# Patient Record
Sex: Female | Born: 1975 | Race: Black or African American | Hispanic: No | Marital: Single | Smoking: Never smoker
Health system: Southern US, Community
[De-identification: ages and names within clinical notes are randomized; demographics above are authoritative.]

## PROBLEM LIST (undated history)

## (undated) DIAGNOSIS — I10 Essential (primary) hypertension: Secondary | ICD-10-CM

## (undated) DIAGNOSIS — D509 Iron deficiency anemia, unspecified: Secondary | ICD-10-CM

## (undated) DIAGNOSIS — D219 Benign neoplasm of connective and other soft tissue, unspecified: Secondary | ICD-10-CM

## (undated) DIAGNOSIS — F4323 Adjustment disorder with mixed anxiety and depressed mood: Secondary | ICD-10-CM

## (undated) DIAGNOSIS — I2699 Other pulmonary embolism without acute cor pulmonale: Secondary | ICD-10-CM

## (undated) DIAGNOSIS — N200 Calculus of kidney: Secondary | ICD-10-CM

## (undated) DIAGNOSIS — F909 Attention-deficit hyperactivity disorder, unspecified type: Secondary | ICD-10-CM

## (undated) HISTORY — DX: Iron deficiency anemia, unspecified: D50.9

## (undated) HISTORY — PX: LEG SURGERY: SHX1003

---

## 1998-04-07 ENCOUNTER — Emergency Department (HOSPITAL_COMMUNITY): Admission: EM | Admit: 1998-04-07 | Discharge: 1998-04-07 | Payer: Self-pay | Admitting: *Deleted

## 1998-04-07 ENCOUNTER — Encounter: Admission: RE | Admit: 1998-04-07 | Discharge: 1998-04-07 | Payer: Self-pay | Admitting: Family Medicine

## 1998-04-21 ENCOUNTER — Encounter: Admission: RE | Admit: 1998-04-21 | Discharge: 1998-04-21 | Payer: Self-pay | Admitting: Family Medicine

## 1998-04-24 ENCOUNTER — Encounter: Admission: RE | Admit: 1998-04-24 | Discharge: 1998-04-24 | Payer: Self-pay | Admitting: Family Medicine

## 1998-04-26 ENCOUNTER — Encounter: Admission: RE | Admit: 1998-04-26 | Discharge: 1998-04-26 | Payer: Self-pay | Admitting: Family Medicine

## 1998-04-28 ENCOUNTER — Encounter: Admission: RE | Admit: 1998-04-28 | Discharge: 1998-04-28 | Payer: Self-pay | Admitting: Family Medicine

## 1998-05-10 ENCOUNTER — Encounter: Admission: RE | Admit: 1998-05-10 | Discharge: 1998-05-10 | Payer: Self-pay | Admitting: Family Medicine

## 1998-06-08 ENCOUNTER — Emergency Department (HOSPITAL_COMMUNITY): Admission: EM | Admit: 1998-06-08 | Discharge: 1998-06-08 | Payer: Self-pay | Admitting: Emergency Medicine

## 1998-06-15 ENCOUNTER — Encounter: Admission: RE | Admit: 1998-06-15 | Discharge: 1998-06-15 | Payer: Self-pay | Admitting: Sports Medicine

## 1998-07-12 ENCOUNTER — Inpatient Hospital Stay (HOSPITAL_COMMUNITY): Admission: AD | Admit: 1998-07-12 | Discharge: 1998-07-12 | Payer: Self-pay | Admitting: Obstetrics

## 1998-07-25 ENCOUNTER — Encounter: Admission: RE | Admit: 1998-07-25 | Discharge: 1998-07-25 | Payer: Self-pay | Admitting: Sports Medicine

## 1998-07-26 ENCOUNTER — Inpatient Hospital Stay (HOSPITAL_COMMUNITY): Admission: AD | Admit: 1998-07-26 | Discharge: 1998-07-26 | Payer: Self-pay | Admitting: *Deleted

## 1998-10-11 ENCOUNTER — Inpatient Hospital Stay (HOSPITAL_COMMUNITY): Admission: AD | Admit: 1998-10-11 | Discharge: 1998-10-11 | Payer: Self-pay | Admitting: *Deleted

## 1998-10-24 ENCOUNTER — Encounter: Admission: RE | Admit: 1998-10-24 | Discharge: 1998-10-24 | Payer: Self-pay | Admitting: Sports Medicine

## 1998-10-27 ENCOUNTER — Encounter: Admission: RE | Admit: 1998-10-27 | Discharge: 1998-10-27 | Payer: Self-pay | Admitting: Family Medicine

## 1998-11-15 ENCOUNTER — Encounter: Admission: RE | Admit: 1998-11-15 | Discharge: 1998-11-15 | Payer: Self-pay | Admitting: Family Medicine

## 1998-11-16 ENCOUNTER — Encounter: Admission: RE | Admit: 1998-11-16 | Discharge: 1998-11-16 | Payer: Self-pay | Admitting: Family Medicine

## 1999-05-21 ENCOUNTER — Emergency Department (HOSPITAL_COMMUNITY): Admission: EM | Admit: 1999-05-21 | Discharge: 1999-05-21 | Payer: Self-pay | Admitting: Internal Medicine

## 1999-06-21 ENCOUNTER — Encounter: Admission: RE | Admit: 1999-06-21 | Discharge: 1999-06-21 | Payer: Self-pay | Admitting: Family Medicine

## 1999-07-01 ENCOUNTER — Emergency Department (HOSPITAL_COMMUNITY): Admission: EM | Admit: 1999-07-01 | Discharge: 1999-07-01 | Payer: Self-pay | Admitting: Emergency Medicine

## 1999-09-27 ENCOUNTER — Emergency Department (HOSPITAL_COMMUNITY): Admission: EM | Admit: 1999-09-27 | Discharge: 1999-09-27 | Payer: Self-pay | Admitting: Emergency Medicine

## 1999-09-27 ENCOUNTER — Encounter: Payer: Self-pay | Admitting: Emergency Medicine

## 1999-10-18 ENCOUNTER — Other Ambulatory Visit: Admission: RE | Admit: 1999-10-18 | Discharge: 1999-10-18 | Payer: Self-pay | Admitting: *Deleted

## 1999-10-18 ENCOUNTER — Encounter: Admission: RE | Admit: 1999-10-18 | Discharge: 1999-10-18 | Payer: Self-pay | Admitting: Family Medicine

## 1999-11-03 ENCOUNTER — Emergency Department (HOSPITAL_COMMUNITY): Admission: EM | Admit: 1999-11-03 | Discharge: 1999-11-03 | Payer: Self-pay | Admitting: Emergency Medicine

## 2000-09-27 ENCOUNTER — Inpatient Hospital Stay (HOSPITAL_COMMUNITY): Admission: AD | Admit: 2000-09-27 | Discharge: 2000-09-27 | Payer: Self-pay | Admitting: Obstetrics

## 2000-09-28 ENCOUNTER — Encounter: Payer: Self-pay | Admitting: Obstetrics

## 2000-10-14 ENCOUNTER — Inpatient Hospital Stay (HOSPITAL_COMMUNITY): Admission: AD | Admit: 2000-10-14 | Discharge: 2000-10-14 | Payer: Self-pay | Admitting: *Deleted

## 2001-01-15 ENCOUNTER — Encounter: Admission: RE | Admit: 2001-01-15 | Discharge: 2001-01-15 | Payer: Self-pay | Admitting: Family Medicine

## 2001-01-21 ENCOUNTER — Ambulatory Visit (HOSPITAL_COMMUNITY): Admission: RE | Admit: 2001-01-21 | Discharge: 2001-01-21 | Payer: Self-pay | Admitting: *Deleted

## 2001-02-04 ENCOUNTER — Inpatient Hospital Stay (HOSPITAL_COMMUNITY): Admission: AD | Admit: 2001-02-04 | Discharge: 2001-02-04 | Payer: Self-pay | Admitting: *Deleted

## 2001-03-01 ENCOUNTER — Inpatient Hospital Stay (HOSPITAL_COMMUNITY): Admission: AD | Admit: 2001-03-01 | Discharge: 2001-03-01 | Payer: Self-pay | Admitting: *Deleted

## 2001-03-02 ENCOUNTER — Inpatient Hospital Stay (HOSPITAL_COMMUNITY): Admission: AD | Admit: 2001-03-02 | Discharge: 2001-03-04 | Payer: Self-pay | Admitting: Obstetrics

## 2001-03-02 ENCOUNTER — Encounter: Admission: RE | Admit: 2001-03-02 | Discharge: 2001-03-02 | Payer: Self-pay | Admitting: Family Medicine

## 2001-03-10 ENCOUNTER — Inpatient Hospital Stay (HOSPITAL_COMMUNITY): Admission: AD | Admit: 2001-03-10 | Discharge: 2001-03-10 | Payer: Self-pay | Admitting: *Deleted

## 2001-03-18 ENCOUNTER — Encounter: Admission: RE | Admit: 2001-03-18 | Discharge: 2001-03-18 | Payer: Self-pay | Admitting: Family Medicine

## 2001-03-20 ENCOUNTER — Encounter: Admission: RE | Admit: 2001-03-20 | Discharge: 2001-03-20 | Payer: Self-pay | Admitting: Family Medicine

## 2001-03-25 ENCOUNTER — Encounter: Admission: RE | Admit: 2001-03-25 | Discharge: 2001-03-25 | Payer: Self-pay | Admitting: Family Medicine

## 2001-04-02 ENCOUNTER — Encounter: Admission: RE | Admit: 2001-04-02 | Discharge: 2001-04-02 | Payer: Self-pay | Admitting: Family Medicine

## 2001-04-02 ENCOUNTER — Encounter: Payer: Self-pay | Admitting: *Deleted

## 2001-04-02 ENCOUNTER — Ambulatory Visit (HOSPITAL_COMMUNITY): Admission: RE | Admit: 2001-04-02 | Discharge: 2001-04-02 | Payer: Self-pay | Admitting: *Deleted

## 2001-04-10 ENCOUNTER — Inpatient Hospital Stay (HOSPITAL_COMMUNITY): Admission: AD | Admit: 2001-04-10 | Discharge: 2001-04-10 | Payer: Self-pay | Admitting: *Deleted

## 2001-04-20 ENCOUNTER — Encounter: Admission: RE | Admit: 2001-04-20 | Discharge: 2001-04-20 | Payer: Self-pay | Admitting: Family Medicine

## 2001-04-25 ENCOUNTER — Inpatient Hospital Stay (HOSPITAL_COMMUNITY): Admission: AD | Admit: 2001-04-25 | Discharge: 2001-04-25 | Payer: Self-pay | Admitting: Obstetrics

## 2001-04-27 ENCOUNTER — Encounter: Admission: RE | Admit: 2001-04-27 | Discharge: 2001-04-27 | Payer: Self-pay | Admitting: Family Medicine

## 2001-04-30 ENCOUNTER — Inpatient Hospital Stay (HOSPITAL_COMMUNITY): Admission: AD | Admit: 2001-04-30 | Discharge: 2001-04-30 | Payer: Self-pay | Admitting: Obstetrics & Gynecology

## 2001-05-12 ENCOUNTER — Inpatient Hospital Stay (HOSPITAL_COMMUNITY): Admission: AD | Admit: 2001-05-12 | Discharge: 2001-05-14 | Payer: Self-pay | Admitting: Obstetrics

## 2001-08-11 ENCOUNTER — Encounter: Admission: RE | Admit: 2001-08-11 | Discharge: 2001-08-11 | Payer: Self-pay | Admitting: Family Medicine

## 2001-08-11 ENCOUNTER — Encounter (INDEPENDENT_AMBULATORY_CARE_PROVIDER_SITE_OTHER): Payer: Self-pay | Admitting: *Deleted

## 2001-09-29 ENCOUNTER — Encounter: Admission: RE | Admit: 2001-09-29 | Discharge: 2001-09-29 | Payer: Self-pay | Admitting: Family Medicine

## 2001-10-06 ENCOUNTER — Encounter: Admission: RE | Admit: 2001-10-06 | Discharge: 2001-10-06 | Payer: Self-pay | Admitting: Family Medicine

## 2001-11-16 ENCOUNTER — Encounter: Admission: RE | Admit: 2001-11-16 | Discharge: 2001-11-16 | Payer: Self-pay | Admitting: Sports Medicine

## 2001-12-01 ENCOUNTER — Inpatient Hospital Stay (HOSPITAL_COMMUNITY): Admission: AD | Admit: 2001-12-01 | Discharge: 2001-12-01 | Payer: Self-pay | Admitting: *Deleted

## 2002-01-01 ENCOUNTER — Encounter: Admission: RE | Admit: 2002-01-01 | Discharge: 2002-01-01 | Payer: Self-pay | Admitting: Family Medicine

## 2002-02-27 ENCOUNTER — Emergency Department (HOSPITAL_COMMUNITY): Admission: EM | Admit: 2002-02-27 | Discharge: 2002-02-27 | Payer: Self-pay | Admitting: Emergency Medicine

## 2002-04-23 ENCOUNTER — Encounter: Admission: RE | Admit: 2002-04-23 | Discharge: 2002-04-23 | Payer: Self-pay | Admitting: Family Medicine

## 2002-07-08 ENCOUNTER — Encounter: Admission: RE | Admit: 2002-07-08 | Discharge: 2002-07-08 | Payer: Self-pay | Admitting: Family Medicine

## 2002-12-02 ENCOUNTER — Encounter: Admission: RE | Admit: 2002-12-02 | Discharge: 2002-12-02 | Payer: Self-pay | Admitting: Sports Medicine

## 2003-03-10 ENCOUNTER — Encounter: Admission: RE | Admit: 2003-03-10 | Discharge: 2003-03-10 | Payer: Self-pay | Admitting: Family Medicine

## 2003-05-12 ENCOUNTER — Encounter: Admission: RE | Admit: 2003-05-12 | Discharge: 2003-05-12 | Payer: Self-pay | Admitting: Family Medicine

## 2003-08-08 ENCOUNTER — Encounter: Admission: RE | Admit: 2003-08-08 | Discharge: 2003-08-08 | Payer: Self-pay | Admitting: Sports Medicine

## 2003-09-19 ENCOUNTER — Inpatient Hospital Stay (HOSPITAL_COMMUNITY): Admission: AD | Admit: 2003-09-19 | Discharge: 2003-09-19 | Payer: Self-pay | Admitting: Obstetrics and Gynecology

## 2003-11-22 ENCOUNTER — Encounter: Admission: RE | Admit: 2003-11-22 | Discharge: 2003-11-22 | Payer: Self-pay | Admitting: Family Medicine

## 2004-04-09 ENCOUNTER — Encounter (INDEPENDENT_AMBULATORY_CARE_PROVIDER_SITE_OTHER): Payer: Self-pay | Admitting: *Deleted

## 2004-04-09 ENCOUNTER — Encounter: Admission: RE | Admit: 2004-04-09 | Discharge: 2004-04-09 | Payer: Self-pay | Admitting: Family Medicine

## 2004-05-04 ENCOUNTER — Encounter: Admission: RE | Admit: 2004-05-04 | Discharge: 2004-05-04 | Payer: Self-pay | Admitting: Family Medicine

## 2004-08-21 ENCOUNTER — Encounter: Admission: RE | Admit: 2004-08-21 | Discharge: 2004-08-21 | Payer: Self-pay | Admitting: Family Medicine

## 2004-11-08 ENCOUNTER — Ambulatory Visit: Payer: Self-pay | Admitting: Family Medicine

## 2004-12-09 ENCOUNTER — Emergency Department (HOSPITAL_COMMUNITY): Admission: EM | Admit: 2004-12-09 | Discharge: 2004-12-09 | Payer: Self-pay | Admitting: Family Medicine

## 2005-06-04 ENCOUNTER — Ambulatory Visit: Payer: Self-pay | Admitting: Family Medicine

## 2005-06-29 ENCOUNTER — Encounter (INDEPENDENT_AMBULATORY_CARE_PROVIDER_SITE_OTHER): Payer: Self-pay | Admitting: *Deleted

## 2005-07-10 ENCOUNTER — Ambulatory Visit: Payer: Self-pay | Admitting: Family Medicine

## 2005-08-30 ENCOUNTER — Ambulatory Visit: Payer: Self-pay | Admitting: Physical Medicine & Rehabilitation

## 2005-08-30 ENCOUNTER — Inpatient Hospital Stay (HOSPITAL_COMMUNITY): Admission: EM | Admit: 2005-08-30 | Discharge: 2005-09-10 | Payer: Self-pay | Admitting: Emergency Medicine

## 2005-09-10 ENCOUNTER — Inpatient Hospital Stay (HOSPITAL_COMMUNITY)
Admission: RE | Admit: 2005-09-10 | Discharge: 2005-09-19 | Payer: Self-pay | Admitting: Physical Medicine & Rehabilitation

## 2005-10-21 ENCOUNTER — Encounter: Admission: RE | Admit: 2005-10-21 | Discharge: 2006-01-19 | Payer: Self-pay | Admitting: Orthopedic Surgery

## 2005-12-05 ENCOUNTER — Ambulatory Visit: Payer: Self-pay | Admitting: Family Medicine

## 2006-01-20 ENCOUNTER — Encounter: Admission: RE | Admit: 2006-01-20 | Discharge: 2006-02-05 | Payer: Self-pay | Admitting: Orthopedic Surgery

## 2006-01-23 ENCOUNTER — Ambulatory Visit: Payer: Self-pay | Admitting: Sports Medicine

## 2006-02-06 ENCOUNTER — Encounter: Admission: RE | Admit: 2006-02-06 | Discharge: 2006-03-05 | Payer: Self-pay | Admitting: Orthopedic Surgery

## 2006-03-06 ENCOUNTER — Encounter: Admission: RE | Admit: 2006-03-06 | Discharge: 2006-06-04 | Payer: Self-pay | Admitting: Orthopedic Surgery

## 2006-12-30 HISTORY — PX: REPLACEMENT TOTAL KNEE: SUR1224

## 2007-02-16 ENCOUNTER — Ambulatory Visit (HOSPITAL_COMMUNITY): Admission: RE | Admit: 2007-02-16 | Discharge: 2007-02-16 | Payer: Self-pay | Admitting: Geriatric Medicine

## 2007-02-16 ENCOUNTER — Ambulatory Visit: Payer: Self-pay | Admitting: Vascular Surgery

## 2007-02-26 DIAGNOSIS — B351 Tinea unguium: Secondary | ICD-10-CM

## 2007-02-26 DIAGNOSIS — D649 Anemia, unspecified: Secondary | ICD-10-CM

## 2007-02-26 DIAGNOSIS — J45909 Unspecified asthma, uncomplicated: Secondary | ICD-10-CM

## 2007-02-26 DIAGNOSIS — E669 Obesity, unspecified: Secondary | ICD-10-CM

## 2007-02-27 ENCOUNTER — Encounter (INDEPENDENT_AMBULATORY_CARE_PROVIDER_SITE_OTHER): Payer: Self-pay | Admitting: *Deleted

## 2007-04-28 ENCOUNTER — Ambulatory Visit (HOSPITAL_COMMUNITY): Admission: RE | Admit: 2007-04-28 | Discharge: 2007-04-28 | Payer: Self-pay | Admitting: Gastroenterology

## 2008-05-16 ENCOUNTER — Inpatient Hospital Stay (HOSPITAL_COMMUNITY): Admission: AD | Admit: 2008-05-16 | Discharge: 2008-05-17 | Payer: Self-pay | Admitting: Gynecology

## 2008-05-27 ENCOUNTER — Inpatient Hospital Stay (HOSPITAL_COMMUNITY): Admission: RE | Admit: 2008-05-27 | Discharge: 2008-05-27 | Payer: Self-pay | Admitting: Obstetrics & Gynecology

## 2008-06-11 ENCOUNTER — Emergency Department (HOSPITAL_COMMUNITY): Admission: EM | Admit: 2008-06-11 | Discharge: 2008-06-11 | Payer: Self-pay | Admitting: Emergency Medicine

## 2008-09-03 ENCOUNTER — Inpatient Hospital Stay (HOSPITAL_COMMUNITY): Admission: AD | Admit: 2008-09-03 | Discharge: 2008-09-03 | Payer: Self-pay | Admitting: Obstetrics and Gynecology

## 2008-10-17 ENCOUNTER — Inpatient Hospital Stay (HOSPITAL_COMMUNITY): Admission: AD | Admit: 2008-10-17 | Discharge: 2008-10-17 | Payer: Self-pay | Admitting: Obstetrics & Gynecology

## 2008-12-18 ENCOUNTER — Inpatient Hospital Stay (HOSPITAL_COMMUNITY): Admission: AD | Admit: 2008-12-18 | Discharge: 2008-12-19 | Payer: Self-pay | Admitting: Obstetrics and Gynecology

## 2009-01-16 ENCOUNTER — Inpatient Hospital Stay (HOSPITAL_COMMUNITY): Admission: AD | Admit: 2009-01-16 | Discharge: 2009-01-18 | Payer: Self-pay | Admitting: Obstetrics and Gynecology

## 2009-06-24 ENCOUNTER — Ambulatory Visit: Payer: Self-pay | Admitting: Diagnostic Radiology

## 2009-06-24 ENCOUNTER — Emergency Department (HOSPITAL_BASED_OUTPATIENT_CLINIC_OR_DEPARTMENT_OTHER): Admission: EM | Admit: 2009-06-24 | Discharge: 2009-06-24 | Payer: Self-pay | Admitting: Emergency Medicine

## 2011-01-19 ENCOUNTER — Encounter: Payer: Self-pay | Admitting: Sports Medicine

## 2011-01-20 ENCOUNTER — Encounter: Payer: Self-pay | Admitting: Orthopedic Surgery

## 2011-04-15 LAB — CBC
Hemoglobin: 10 g/dL — ABNORMAL LOW (ref 12.0–15.0)
Hemoglobin: 9.9 g/dL — ABNORMAL LOW (ref 12.0–15.0)
MCV: 85.4 fL (ref 78.0–100.0)
Platelets: 207 10*3/uL (ref 150–400)
Platelets: 220 10*3/uL (ref 150–400)
RDW: 16 % — ABNORMAL HIGH (ref 11.5–15.5)
WBC: 9.9 10*3/uL (ref 4.0–10.5)

## 2011-05-17 NOTE — Op Note (Signed)
NAMEJAMESIA, LINNEN NO.:  [id number]   MEDICAL RECORD NO.:  [id number]          PATIENT TYPE:  INP   LOCATION:  5008                         FACILITY:  [facility]   PHYSICIAN:  Doralee Albino. Carola Frost, M.D. DATE OF BIRTH:  [birth year]   DATE OF PROCEDURE:  09/03/2005  DATE OF DISCHARGE:                                 OPERATIVE REPORT   PREOPERATIVE DIAGNOSIS:  Open left leg lateral fasciotomy.   POSTOPERATIVE DIAGNOSIS:  Open left leg lateral fasciotomy.   PROCEDURE:  Irrigation and debridement with delayed primary closure of open  15 cm lateral wound.   SURGEON:  Doralee Albino. Carola Frost, M.D.   ASSISTANT:  None.   ANESTHESIA:  General.   COMPLICATIONS:  None.   TOURNIQUET:  None.   ESTIMATED BLOOD LOSS:  Scant.   FINDINGS:  Pink, healthy appearing, contractile muscle of both the lateral  and anterior compartments.   DISPOSITION:  To PACU.   CONDITION:  Stable.   BRIEF SUMMARY OF INDICATIONS FOR PROCEDURE:  Ms. Kristen Swanson is a 35 year old  female who sustained a low energy fracture dislocation of the left knee  treated by Dr. Priscille Kluver with spanning external fixation and delayed four  compartment fasciotomies. Angiogram was negative for vascular injury. The  patient did have a peroneal nerve sensory and motor deficit at the time of  her injury which has persisted. She underwent medial closure at the time of  her initial fasciotomy and now presents for repeat irrigation and  debridement with possible delayed primary closure versus wound VAC  application. She understood the risks of infection and possible need for  further surgery as well as other complications. She also understood that she  will require additional procedures including internal fixation of her  fracture as well as repair of her posterolateral corner with or without  repair of her cruciate ligaments.   BRIEF SUMMARY OF PROCEDURE:  Ms. Kristen Swanson was taken to the operating room where  general anesthesia was  induced. The left lower extremity was prepped and  draped in the usual sterile fashion. The patient was administered 1 g of  Ancef.  Her wound was irrigated with 2 liters of normal saline using a bulb  syringe removing the hematoma and clot. The muscle bellies were examined and  were found to contract to physical touch as well as electrocautery.  The  muscle bellies themselves were pink and healthy. There were no areas of  necrosis that required debridement. The wound was closed using 2-0 Vicryl  for the subcutaneous layer and 3-0 nylon for the skin. Sterile gently  compressive dressing was applied.  The pin sites were redressed as well. The  patient was taken to PACU in stable condition.   PROGNOSIS:  Ms. Ambroise has sustained a serious injury to her left knee  resulting in three problems which required correction.  The first of these  was elimination of the open wound on the lateral aspect of her leg from her  fasciotomy. This was performed today without apparent complication. She will  require the internal fixation of her plateau  fracture with treatment of her  knee capsule injury as well, particularly involving the posterior lateral  corner.  We are hopeful to have this performed within two weeks from injury.  CT scan also demonstrates involvement of her tibial eminence which may  represent avulsion of her cruciate ligaments rather than mid substance  tearing. If healing of this fracture in an anatomic position restores  appropriate anterior and posterior laxity, then it is likely that Ms. Strada  could have a stable knee following the index procedure as described above.  Alternatively, she will need to undergo delayed reconstruction of her right  cruciate ligaments. With regard to her peroneal motor palsy, she will  require prolonged management with EMG studies around four to six weeks as a  baseline. She will likely require an AFO as expected management is followed.  She may be a  candidate given her young age for a soft tissue procedure to  restore active extension of her foot and ankle. There is controversy  regarding the length of time to persist with spanning external fixation of  the knee. Given her large body habitus, it is unlikely that a hinged knee  brace could adequately stabilize and protect the soft tissue repair of her  knee. Consequently, the fixator may need to be continued for four to six  weeks. This patient's presentation and injuries have been discussed with  some of my trauma colleagues and her case will continue to be discussed with  someone from sports medicine domain, particularly those who specialize in  multiligament knee reconstructions. The patient remains at increased risk  for infection, given her fasciotomies and also at increased risk for  thromboembolic complications given her obesity. She will remain on DVT  prophylaxis with nonweightbearing on the left lower extremity.      Doralee Albino. Carola Frost, M.D.  Electronically Signed     MHH/MEDQ  D:  09/03/2005  T:  09/03/2005  Job:  [id number]

## 2011-05-17 NOTE — Discharge Summary (Signed)
[name], Swanson NO.:  [id number]   MEDICAL RECORD NO.:  [id number]          PATIENT TYPE:  INP   LOCATION:  5008                         FACILITY:  [facility]   PHYSICIAN:  Doralee Albino. Carola Frost, M.D. DATE OF BIRTH:  04-Apr-1976   DATE OF ADMISSION:  08/30/2005  DATE OF DISCHARGE:  09/10/2005                           DISCHARGE SUMMARY - REFERRING   ADMISSION DIAGNOSES:  1.  Left tibial plateau fracture.  2.  Left peroneal nerve palsy.   DISCHARGE DIAGNOSIS:  Status post open reduction and internal fixation, left  tibial plateau, as well as peroneal nerve exploration release.   SURGEON:  Doralee Albino. Carola Frost, M.D.   PROCEDURES PERFORMED:  1.  August 31, 2005, for compartment fasciotomy left leg performed by Dr.      Priscille Kluver without complications.  2.  September 03, 2005, irrigation and debridement with delayed primary      closure of open 15 cm lateral wound done by Doralee Albino. Carola Frost, M.D.,      without complications.  3.  August 30, 2005, closed manipulation with application of external      fixator on the left lower extremity by Dr. Priscille Kluver without      complications.  4.  September 05, 2005, open reduction and internal fixation of      intercondylar tibial plateau, left knee.  Open treatment with primary      repair of knee dislocation including repair of posterior lateral  corner      structures, LCL popliteal fibular ligament, repair of biceps tendon to      the fibula, allograft reconstruction, common peroneal nerve exploration      release and lateral meniscus repair by Dr. Carola Frost without complications.   HISTORY:  This is a 35 year old African American female who presented to  [facility] Emergency Room with left knee pain after a fall.  She reports  that she was going down stairs, missed a step and tripped.  Upon falling on  her left knee, she heard a crack with immediate left knee pain.  She also  bumped her right forehead.  She also was complaining of  foot ankle numbness  and tingling.   ALLERGIES:  No known drug allergies.   MEDICATIONS:  1.  Albuterol inhaler two puffs q.4h. p.r.n.  2.  Zyrtec 10 mg daily.   PAST MEDICAL HISTORY:  1.  Asthma.  2.  Allergies.   PAST SURGICAL HISTORY:  ORIF right wrist fracture.   SOCIAL HISTORY:  Denies smoking or alcohol.  She is single.  Lives with two  kids, a 19 and an 81-year-old in a one story house and works in Airline pilot.   PHYSICAL EXAMINATION ON ADMISSION:  GENERAL APPEARANCE:  A well-developed,  well-nourished, obese 35 year old [place] American female.  HEENT:  Pupils are equal, round, reactive to light and accommodation.  Normocephalic and atraumatic.  NECK:  Supple.  CHEST:  Clear to auscultation bilaterally.  CARDIOVASCULAR:  Regular rate and rhythm, normal S1 and S2.  ABDOMEN:  Positive bowel sounds, soft and nontender.  EXTREMITIES:  Left leg short and  externally rotated.  Unable to dorsiflex  great toe, DP and posterior tib 2+ bilaterally.   Patient was admitted on August 30, 2005, undergoing closed reduction with  external fixation by Dr. Erasmo Leventhal without complications.  She tolerated  the procedure quite well.   On August 31, 2005, the patient then had a four compartment fasciotomy on  the left lower extremity performed by Dr. Priscille Kluver without complications.   On September 01, 2005, patient doing fairly well.  Wound VAC is active on  left lower extremity as well as external fixator in place.  Vital signs are  reviewed and within normal limits.   On September 02, 2005, patient seen.  Pain is very severe today.  We will  change up pain medicine today to help control pain medicine.   On September 03, 2005, Dr. Myrene Galas was consulted to take over care on  Ms. Kristen Swanson.  He evaluates the patient today.  Patient has no superficial  peroneal nerve sensation with decreased deep peroneal nerve sensation.  This  day, Dr. Carola Frost did take patient back to the OR for  fasciotomy closure and  she tolerated the procedure without complications.   On September 04, 2005, patient is seen again.  No change in exam from  previous days.  External fixator remains in place.  Patient will need  further surgery on the left knee.  We will obtain an MRI to find out  severity of soft tissue injuries to the left knee.  Patient is also given  potassium replacement on this day.   Patient seen on September 06, 2005.  Chief complaint is pain.  We will obtain  a pain service consult today.  Dressing change will take place in the a.m.  Will hold off on physical therapy at this time secondary to severe pain.  She will be nonweightbearing on the left lower extremity.  We will continue  to monitor lab values.  We anticipate discharge Sunday or Monday.   Patient seen September 07, 2005.  Unable to mobilize very well with physical  therapy.  She reports that she also is living at home alone with two kids.  Does not feel that she is capable of going home and resuming this lifestyle  at this time secondary to pain and inability to mobilize.  She is requesting  further evaluation for possible placement in rehab or [facility]U.  Those consults  were obtained and awaiting bed availability and whether or not she is a  candidate for rehab or SACU.   On September 09, 2005, she is seen once again.  There has been no change in  the exam.  External fixator remains in place.  Bandages were replaced on the  external fixator at this time by Cecil Cranker, PA.  Rehab is  evaluating patient to see if she is a candidate for rehab at this point in  time.   On September 10, 2005, patient is seen.  She did very well with physical  therapy on previous day.  She will be transported to rehab today for further  care and treatment.   CONDITION ON DISCHARGE:  Good.   DISPOSITION:  Discharge to rehab.  DISCHARGE INSTRUCTIONS:  Patient is nonweightbearing on the left lower  extremity at this time.   She will need daily dressing changes on her  incisions and wounds on her left lower extremity as well as daily pin site  care to include Kerlix only to wrap tightly around the  pin sites.  If pin  sites become dirty or otherwise suspicious, please wash only with soap and  water and then dry completely and rewrap with Kerlix tightly once again.  AFO for foot drop.   DISCHARGE MEDICATIONS:  1.  Claritin 10 mg p.o. nightly which is a substitute for her Zyrtec.  2.  Coumadin per pharmacy protocol.  3.  Oxycodone 40 mg p.o. q.12h.  4.  Robaxin 500 mg p.o. q.6h.  5.  Albuterol 2.5 mg inhaler p.r.n.  6.  Albuterol two puffs q.4h. p.r.n.  7.  Percocet one to two tablets p.o. q.4h. p.r.n. pain.  8.  Ativan 0.5 mg p.o. q.8h. p.r.n.  9.  Ambien 5 to 10 mg nightly p.r.n.   FOLLOW UP:  Please follow up with Dr. Myrene Galas in approximately one  week from discharge.      Cecil Cranker, PA      Doralee Albino. Carola Frost, M.D.  Electronically Signed    RWC/MEDQ  D:  09/10/2005  T:  09/10/2005  Job:  [id number]

## 2011-05-17 NOTE — Discharge Summary (Signed)
NAMEGIANNINA, [name]   MEDICAL RECORD NO.:  [id number]          PATIENT TYPE:  IPS   LOCATION:  4011                         FACILITY:  [facility]   PHYSICIAN:  Ranelle Oyster, M.D.DATE OF BIRTH:  11/18/1976   DATE OF ADMISSION:  09/10/2005  DATE OF DISCHARGE:  09/18/2005                                 DISCHARGE SUMMARY   ADDENDUM:  Concerning discharge date initially established for September 17, 2005. This  was extended to September 18, 2005, to enable external fixator to be removed  from the lower extremity, monitor the patient's mobility at that time with  an extra day of therapies, to address activities of daily living, and  functional status, and discharge to be extended until September 18, 2005.  All necessary therapy teams were advised of change in discharge plan. The  patient remained medically stable throughout this time.      Mariam Dollar, P.A.      Ranelle Oyster, M.D.  Electronically Signed    DA/MEDQ  D:  09/16/2005  T:  09/16/2005  Job:  [id number]

## 2011-05-17 NOTE — Discharge Summary (Signed)
[facility]  Patient:    Kristen Swanson, Kristen Swanson                          MRN: [id number] Adm. Date:  [id number] Disc. Date: 03/04/01 Attending:  Tammi Sou Dictator:   Ocie Doyne, CC:         Solon Palm, M.D.   Discharge Summary  DATE OF BIRTH:                Dec 15, 1976  DISCHARGE DIAGNOSES:          1. Preterm labor.                               2. A [redacted] week gestation intrauterine pregnancy.  DISCHARGE MEDICATIONS:        1. Procardia XL 60 mg p.o. q.d.                               2. Prenatal vitamin one p.o. q.d.  HISTORY AND PHYSICAL:         This 35 year old G38, P1-0-2-1 at 43 and 5/[redacted] weeks gestation by LMP and 23 week ultrasound was sent from family practice clinic secondary to preterm contractions with cervical changes.  Her examination on March 01, 2001 was closed, long, and high.  Her examination on March 02, 2001 at family practice clinic was 1-2 cm, long, and high.  The patient reported feeling occasional contractions and fetal movement.  There was no leakage of fluid or vaginal bleeding.  Other significant medical conditions include depression and asthma and she had been started on Prozac 20 mg q.d. in the clinic that day.  She has a history of drug use during pregnancy.  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  PHYSICAL EXAMINATION  VITAL SIGNS:                  She was afebrile.  Vitals were stable.  ABDOMEN:                      Soft with some tenderness in the bilateral lower quadrants.  Fetal heart rate was in the 140s with good variability and no decelerations.  PELVIC:                       On toco she was having uterine irritability with occasional contractions.  A sterile vaginal examination revealed a cervix 1-2 cm dilated, soft, thick, and high.  LABORATORIES:                 Urine drug screen was negative.  Wet prep which was performed at  [facility] showed 3-5 white cells, 1+ bacteria, no clue, yeast, or Trichomonas.  HOSPITAL COURSE:              Ms. Falkenstein was admitted to the antenatal unit where she was started on Procardia XL 60 mg q.d.  She remained on bed rest with b.i.d. fetal monitoring and toco.  Fetal heart tracings remained reassuring.  She contracted between three and five times an hour reporting mild discomfort with contractions.  No vaginal bleeding or discharge.  On hospital day #2 and #3 she was reexamined.  Her vaginal examination  again revealed a cervix 1-2 cm dilated, soft, thick, and high.  The patient reported that the intensity of her contractions was markedly diminished on Procardia. She was seen by social work during this hospitalization for a history of substance abuse during pregnancy.  She stated that before she knew she was pregnant she was at a party drinking and a friend gave her ecstasy.  She denied having taken barbituates despite a positive urine drug screen on January 15, 2001.  An ADS referral was offered but patient declined indicating she did not have a problem with substance use.  She received a partial course of dexamethasone for fetal lung maturity three injections of 6 mg.  She declined the fourth injection which would be scheduled to take place at midnight.  CONDITION ON DISCHARGE:       Stable.  DISCHARGE INSTRUCTIONS:       She is given preterm labor instructions, to return immediately for an increase in contractions, vaginal bleeding, or leaking of fluid.  She will continue on Procardia XL 60 mg q.d. as well as her prenatal vitamins and Prozac and is to maintain strict bed rest up to the bathroom or shower only.  She states that her boyfriend will assist her in caring for her 81-year-old child and understands that her risk for preterm delivery is higher if she is not able to maintain bed rest.  Followup will be at [facility] with Dr. Solon Palm as scheduled on March 16, 2001 for routine OB care. DD:  03/04/01 TD:  03/04/01 Job: [id number]

## 2011-05-17 NOTE — Op Note (Signed)
NAMECOHEN, DOLEMAN NO.:  [id number]   MEDICAL RECORD NO.:  [id number]          PATIENT TYPE:  INP   LOCATION:  5727                         FACILITY:  [facility]   PHYSICIAN:  John L. Rendall, M.D.  DATE OF BIRTH:  [birth year]   DATE OF PROCEDURE:  08/30/2005  DATE OF DISCHARGE:                                 OPERATIVE REPORT   PREOPERATIVE DIAGNOSIS:  Fracture dislocation, tibial plateau fracture, with  posterior dislocation.   SURGICAL PROCEDURES:  Closed manipulation followed by application of  external fixator, left proximal tibia fracture dislocation.   SURGEON:  Dr. Priscille Kluver.   ASSISTANT:  Duffy, P.A.-C.   ANESTHESIA:  General.   PATHOLOGY:  The patient is a large 35 year old female who fell, fracturing  obliquely from the lateral plateau to the medial metaphysis of the tibia.  The medial fragment proximally is intact with the femur, but the lateral  fragment was displaced posteriorly the full width of the tibia, and there is  a peroneal nerve palsy preoperatively.   PROCEDURE:  Under general anesthesia, closed manipulation was done by first  longitudinal traction of a flexed knee and then pressure on the anterior  medial tibia fragment pulling the posterior fragment forehead. It is  actually felt to move and come forward the depth of the tibial fragment. At  this point, C-arm shows good reduction on the lateral, but on AP view, the  lateral tibia was displaced laterally about 1/2 inch. Another manipulation  was then done in the medial lateral direction, first applying longitudinal  traction and then reducing the laterally displaced distal fragment. C-arm  pictures now reveal significantly improved alignment on both AP and lateral  views. The tibia is at this point suspended over blankets in a flexed 10  degrees position. It is left where it is and prepped where it is, and at  this point, the Synthes set is used after prepping with DuraPrep and  draping  as a sterile field. The Schanz pins are placed proximally in the distal  third femur and in the proximal third tibia in an anterior-posterior  direction right down the midline. With the first pin, the apparatus was used  as a guide for the second pin, and due to the size of the leg, two carbon  fiber rods were used for stability of the fracture C-arm pictures reveal  minimal displacement of the lateral tibial fracture on the AP view once the  apparatus is in place. At this point, the distal attachment the rods are  loosened, and the longitudinal traction is applied and the manipulation  medially and laterally occurs to reduce the fracture fragment. Improvement  of about 3-4 mm of the articular separation occurred, and this was felt to  be quite acceptable. At this point, the apparatus was retightened and the  case ended sterile. Dressings were applied about pins, and the patient  returned to recovery in good condition.      John L. Rendall, M.D.  Electronically Signed    JLR/MEDQ  D:  08/30/2005  T:  08/31/2005  Job:  [id number] 

## 2011-05-17 NOTE — H&P (Signed)
[name], [name] NO.:  [id number]   MEDICAL RECORD NO.:  [id number]          PATIENT TYPE:  IPS   LOCATION:  4011                         FACILITY:  [facility]   PHYSICIAN:  Erick Colace, M.D.DATE OF BIRTH:  November 29, 1976   DATE OF ADMISSION:  09/10/2005  DATE OF DISCHARGE:                                HISTORY & PHYSICAL   REASON FOR CONSULTATION:  Decreased ability to walk and care for self  following left lower extremity trauma.   HISTORY OF PRESENT ILLNESS:  [name] admitted August 30, 2005, after a fall when she missed a step. ? She had no loss of  consciousness.  She sustained a fracture dislocation of the left tibial  plateau.  She underwent closed manipulation followed by application of  external fixator September 1.  She developed compartment syndrome left leg  requiring fasciotomy September 2, delayed closure until September 5.  Wound  VAC placed.  Also with left knee fracture dislocation with primary repair  for lateral ligament ORIF using unicondylar tibial plateau component.  Also  with biceps femoral tendon avulsion September 7.  She was treated with  conservative care in regards to this injury.  She is nonweightbearing left  lower extremity.  Her PCA was discontinued on September 8.  She is now  admitted for comprehensive inpatient rehabilitation program.   PAST MEDICAL HISTORY:  Significant for asthma.  Negative for ETOH, negative  for tobacco.   FAMILY HISTORY:  Noncontributory.   SOCIAL HISTORY:  Lives with a 12 and 87-year-old child.  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  No local family.  Sister from [place] plans to assist  for 2 weeks.  Family and supportive friends.  One-level home, one step to  enter.   FUNCTIONAL HISTORY:  Previously independent.   FUNCTIONAL STATUS:  Minimal to moderate assistance edge of bed, minimum  assistance gait with rolling walker.   MEDICATIONS:  1.  Albuterol p.r.n.  2.  Zyrtec  daily.   ALLERGIES:  None known.   Last hemoglobin 8.6, white count 11.7, platelets 315,000.  BUN 2, creatinine  0.6, sodium 133, potassium 3.4.  Last INR 2.4.   PHYSICAL EXAMINATION:  GENERAL:  Obese, African-American female in no acute  distress.  Mood and affect bright, appropriate.  General appearance is  normal.  EYES:  Not injected, anicteric.  EXTERNAL ENT:  Normal.  NECK:  Supple without adenopathy.  LUNGS:  Respiratory effort is good, lungs clear to auscultation.  ABDOMEN:  Positive bowel sounds.  Soft, nontender to palpation.  NEUROLOGIC:  Left lower extremity has external fix on, pin sites appear  good.  She has decreased sensation over the left toes and inability to raise  her great toe on the left.  She is able to flex her toes on the left side.  Bilateral upper extremities are 4/5, right lower extremity 4/5.  Mood,  affect, memory, judgment, orientation are all normal.   IMPRESSION:  1.  Decreased self care and mobility secondary to left tibial plateau  fracture as well as fracture dislocation of left knee status post      external fixator placement September 1, status post open reduction and      internal fixation using a unicondylar component for left tibial plateau      repair of lateral collateral ligament.  She had a compartment syndrome      status post fasciotomy September 2.  2.  Left perineal palsy, traumatic, with foot drop.  She also has      neuropathic pain. We will add Nortriptyline for her neuropathic pain.  3.  Deep vein thrombosis prophylaxis with Coumadin.  4.  Asthma: Continue p.r.n. albuterol.   Estimated length of stay is 1 week.  The patient is a good rehab candidate.      Erick Colace, M.D.  Electronically Signed     AEK/MEDQ  D:  09/10/2005  T:  09/10/2005  Job:  [id number]   cc:   Doralee Albino. Carola Frost, M.D.  Fax: [phone]

## 2011-05-17 NOTE — Op Note (Signed)
Kristen Swanson, [name]   MEDICAL RECORD NO.:  [id number]          PATIENT TYPE:  INP   LOCATION:  5008                         FACILITY:  [facility]   PHYSICIAN:  John L. Rendall, M.D.  DATE OF BIRTH:  Oct 20, 1976   DATE OF PROCEDURE:  08/31/2005  DATE OF DISCHARGE:                                 OPERATIVE REPORT   PREOPERATIVE DIAGNOSIS:  Compartment syndrome, left leg, multiple  compartments.   SURGICAL PROCEDURES:  Four compartment fasciotomy, left leg.   POSTOPERATIVE DIAGNOSIS:  Compartment syndrome, left leg, multiple  compartments.   SURGEON:  John L. Rendall, M.D.   ANESTHESIA:  General.   PATHOLOGY:  The patient was found to have a progressively increasing calf  pain on rounds this morning at 11:00 a.m.  She described the feeling of the  charlie horse that started about that time.  She still had pulses in the  foot.  An arteriogram was ordered to make sure no significant bleeding was  encountered.  Arteriogram was negative.   FINDINGS:  Findings at surgery were a dusk bluish muscle in the proximal one-  third of the peroneal and anterior compartments.  The compartments on the  other side were much softer but due to 2 cm of fat over the compartments, it  was felt appropriate to go ahead and release rather than guess what the  tissue was doing deep under the skin.  Consequently, a smaller incision was  made in the deep and superficial compartments medially and a long incision  was made on the anterior and peroneal compartments, and this one left open.   DESCRIPTION OF PROCEDURE:  Under general anesthesia, the left leg was  prepared with Betadine and draped as a sterile field.  An approximate 8  inches incision was made between the peroneal and ,anterior compartments  laterally, and dissection was carried down through 2 cm of fat to the  fascia/  Upon releasing the fascia, significant expansion occurred, and  dusky bluish purplish muscle  was encountered in the proximal third of the  anterior compartments and similar but milder changes in the peroneal  compartment.  The compartment release extended approximately 2-1/2 inches  proximal to the incision.  Attention was then turned to the medial side of  the tibia for the deep and superficial posterior compartments with the fatty  layer being so significant.  Even though this felt soft, it was felt  appropriate to go ahead and make a fasciotomy incision.  This was done 4  inches in length and subcutaneous fasciotomy of the deep and superficial  compartments were done.  Findings were that of fairly healthy muscle but  still extension of the compartments occurred approximately an inch in each.  Once this was completed and minor venous bleeding controlled, the medial  incision was closed with 2-0 Vicryl subcu and skin clips.  The  lateral incision just had several subcutaneous stitches put in but the skin  was left open and the center of the wound was packed with Adaptic and  Betadine soaked gauze, and  a sterile dressing and short-leg splint were  applied.  New dressings were applied about the pins over her external  fixator.  The patient returned to recovery in good condition.      John L. Rendall, M.D.  Electronically Signed     JLR/MEDQ  D:  08/31/2005  T:  09/01/2005  Job:  [id number]

## 2011-05-17 NOTE — Discharge Summary (Signed)
NAMEJESSICAH, [name]   MEDICAL RECORD NO.:  [id number]          PATIENT TYPE:  IPS   LOCATION:  4011                         FACILITY:  [facility]   PHYSICIAN:  Ranelle Oyster, M.D.DATE OF BIRTH:  [birth year]   DATE OF ADMISSION:  09/10/2005  DATE OF DISCHARGE:  09/17/2005                                 DISCHARGE SUMMARY   DISCHARGE DIAGNOSES:  1.  Left tibial plateau fracture--fracture/dislocation left knee, status      post external fixator on September 1st with open reduction, internal      fixation.  2.  Compartment syndrome, status post four part fasciotomy on August 31, 2005.  3.  Status post repair of left lateral collateral ligament.  4.  Pain management.  5.  Coumadin for deep venous thrombosis prophylaxis.  6.  History of asthma.   HISTORY OF PRESENT ILLNESS:  This is a 35 year old black female admitted on  August 30, 2005, after a fall when she missed a step. No loss of  consciousness. She sustained a fracture, dislocation of tibial plateau  fracture. She underwent closed manipulation followed by application of  external fixator, August 30, 2005, per Dr. Priscille Kluver. Compartment syndrome,  left leg, with four part fasciotomy on September 2nd with delayed closure  until September 5th per Dr. Carola Frost with [facility] placed. Also of a left knee  fracture, dislocation with primary repair of knee and repair of lateral  collateral ligament, open reduction, internal fixation of Unicondylar tibial  plateau. Also with biceps femoris tendon avulsion on September 7th with  conservative care. Placed on Coumadin for deep venous thrombosis  prophylaxis, nonweightbearing left lower extremity, pain controlled with PCA  and discontinued on September 06, 2005.   PAST MEDICAL HISTORY:  See discharge diagnoses. No alcohol or tobacco.   ALLERGIES:  None.   MEDICATIONS:  Prior to admission were albuterol inhaler p.r.n. and Zyrtec  daily.   SOCIAL  HISTORY:  Lives with 52- and 50-year-old children. 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  No local family. She has a sister from [place] to  assist for approximately for two weeks after discharge. Good support of  local friends. They live in a one level home with one step to entry.   HOSPITAL COURSE:  The patient did well while on rehab services with  therapies initiated on a b.i.d. basis. The following issues are followed  during patient's rehab course. Pertaining to [name] left tibial plateau  fracture with dislocation of fractured left knee, external fixator in place,  routine dressing changes as advice, an ACE wrap remained in place around 10  sites to help secure against her skin. She was placed on Keflex on September  17th for some draining of the proximal pin sites. She remained afebrile. She  remained on Coumadin for deep venous thrombosis prophylaxis with latest INR  of 2.2. She will complete Coumadin protocol followed by Dr. Genevieve Norlander. Pain  management with the use of OxyContin SR that was titrated to 40 mg q.12h,  the  addition of Pamelor at bedtime, and oxycodone as needed for breakthrough  pain. She was using Robaxin for muscle spasms as needed. She would be slowly  weaned from her OxyContin. She had no issues pertaining to her asthma during  her rehab stay. She had no bowel or bladder disturbances. She was  essentially minimal assist for lower body dressing, supervision upper body,  ambulating short household distances with an assistive device. Home health  therapies have been arranged.   Latest labs showed a hemoglobin of 9.4, hematocrit 28.5. She was transfused  during her rehab stay for an admission hemoglobin of 7.7. She received two  units of packed red blood cells. There were no other bleeding episodes.  Latest sodium was 138, potassium 3.6, BUN 5, creatinine 0.8.   Discharge medications at time of dictation included Coumadin with latest  dose of 5 mg to be  completed on September 30, 2005; Keflex 500 mg q.i.d. times  five days, Pamelor 10 mg at bedtime, Oxycodone 5 mg one or two tablets q.4h.  p.r.n. pain, Robaxin 500 mg q.6h. p.r.n. spasm, OxyContin Sustained Release  40 mg q.12h. with taper as advised.   DIET:  Regular.   ACTIVITY:  Nonweightbearing left leg.   FOLLOWUP:  Dr. Carola Frost, orthopedic service, as advised. Outpatient clinic  medical management, Dr. Ranelle Oyster, outpatient rehab service as  needed. Home health nursing had been arranged to be complete Coumadin  protocol followed by [facility] agency. Dressing changes with  cleansing of pin sites daily, half-strength hydrogen peroxide as needed and  a 4-inch Ace wrap around pin sites proximal and then around her waist to  help suspend the external fixator. The home health nurse had been arranged  for necessary monitoring of wound.      Mariam Dollar, P.A.      Ranelle Oyster, M.D.  Electronically Signed    DA/MEDQ  D:  09/16/2005  T:  09/16/2005  Job:  [id number]   cc:   Doralee Albino. Carola Frost, M.D.  Fax: [phone]   [facility]

## 2011-05-17 NOTE — Op Note (Signed)
NAMEANIYA, JOLICOEUR NO.:  [id number]   MEDICAL RECORD NO.:  [id number]          PATIENT TYPE:  INP   LOCATION:  5008                         FACILITY:  [facility]   PHYSICIAN:  Doralee Albino. Carola Frost, M.D. DATE OF BIRTH:  05/30/1976   DATE OF PROCEDURE:  09/05/2005  DATE OF DISCHARGE:                                 OPERATIVE REPORT   PREOPERATIVE DIAGNOSES:  1.  Left knee fracture dislocation, status post external fixation with prior      compartment release and fasciotomy closure as well.  2.  Lateral meniscus avulsion.  3.  Biceps femoris tendon avulsion   POSTOPERATIVE DIAGNOSES:  1.  Left knee fracture dislocation, status post external fixation with prior      compartment release and fasciotomy closure as well.  2.  Lateral meniscus avulsion.  3.  Biceps femoris tendon avulsion   PROCEDURES:  1.  Open reduction and internal fixation, unicondylar tibial plateau.  2.  Open treatment with primary repair of knee dislocation including repair      of the posterolateral corner structures (lateral collateral ligament,      popliteal fibular ligament).  3.  Primary repair of the biceps tendon to the fibula.  4.  Allograft reconstruction of the posterolateral complex using a figure-of-      eight anterior tibialis allograft.  5.  Common peroneal nerve exploration and release.  6.  Lateral meniscus repair.   INDICATION FOR PROCEDURE:  The patient underwent a full discussion of the  risk of reconstruction of her bone and soft tissue injury.  This included  the possibilities for treatment such as fixation of the bone only, prolonged  use of the external fixator, primary repair of the capsule and  posterolateral corner versus combination of the above.  After a full  discussion of those risks, she wished to proceed with repair of the bone,  reconstruction with primary repair of her posterolateral corner and possible  continuation of the external fixture.  She also consented  to use of  allograft materials to augment her repair and underwent a full discussion of  the risks of infection and soft tissue irritation prior to proceeding.  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   BRIEF DESCRIPTION OF PROCEDURE:  The patient was administered preoperative  antibiotics.  She was taken to the operating room where her left lower  extremity was prepped and draped in the usual sterile fashion.  A tourniquet  was placed about the thigh, but was never inflated during the case.  We  began with a lateral incision which began proximally and posteriorly and was  curvilinear across the knee crease and then extended laterally down over the  head of fibula in line with prior lateral fasciotomy.  The peroneal nerve  was identified proximally under the biceps tendon and carefully traced  distally.  The nerve in the main trunk was approximately 6 mm in diameter.  As we entered the area of injury which was  just proximal to the fibula, the  width of injured nerve was 15 mm.  There appeared to be hematoma within the  nerve; the nerve itself, however, was in continuity.  Release was continued  down both the superficial and deep peroneal branches for a centimeter and a  half or so after the split.  The posterior knee capsule was readily visible  and was torn.  The biceps tendon was likewise torn from the fibular head.  The popliteal fibular ligament was seen coming from the medial side of the  proximal tibia in fan-like fashion as a confluence of the capsule.  The  lateral collateral ligament was also identified deep to the biceps.  These  were each tagged with #2 FiberWire sutures and were later repaired to the  stump of tissue left at the proximal aspect of the fibular head up.  The  lateral meniscus was also examined and was noted to be intact.  The femoral  cartilage was  well-preserved.  The tibial cartilage had striations within  it.  Medial to the split in the lateral tibial plateau, there was a small  area missing anteriorly which was not in a contact area of articulation.  The fracture site was cleaned with curette after releasing the external  fixator which had been maintained throughout prepping and had been sterilely  prepped, but yet isolated from the field all the same with Kerlix and  sterile towels and Coban dressing.  Once we unlocked the fixator, we were  able to distract the fracture site somewhat and again clean out the  hematoma.  We then made a separate medial incision, carrying down to the  subcutaneous fascia.  We did not encounter the saphenous vein.  The  sartorius fascia was split in line with the fibers and the hamstring tendons  identified and retracted with a Penrose drain.  Deep to this, the periosteum  was incised directly over the fracture, exposing the fracture site.  There  was posterior displacement of the lateral plateau and also some rotational  deformity.  While looking through the arthrotomy laterally and using the  [facility] clamp to achieve compression across the articular surface, we  placed 0.62 K-wires subchondrally, and then placed a pre-contoured proximal  humerus locking plate on the medial side.  The proper height for this plate  was obtained and maintain with a small K-wire.  This achieved compression  across the articular surface, leaving again only the gap at the anterior  aspect near the insertion of the ACL but lateral to it.  The plate was then  firmly apposed to bone with a single cortical screw in the sliding hole and  flouro shots obtained showing appropriate plate placement and reduction of  the fracture.  Additional lag screws were placed over-drilling the near  cortex with a 0.35 screw through subchondral bone.  We then placed  additional locked screws into the lateral fragment as well as into  the shaft, as the cortical screw used initially to buttress and appose the plate  were too long after compression.  The nearby locking screw was placed and  the standard cortical screw removed.  Following this, we then turned our  attention back to the posterolateral corner of the knee.  The guide pin for  the cannulated drill was placed through the proximal aspect of the fibular  neck and checked under x-ray to make sure it was in the center/center  position.  It was then overdrilled with a  6-mm cannulated drill.  The  anterior tibialis tendon which had been prepared on the back table was then  passed under direct visualization through this bone tunnel, making certain  that the peroneal nerve was protected and not under traction during its  retraction from the field.  The primary repair was then carried out through  all the sutures which had previously been passed through this area; this  resulted in an a very stout repair, as the stump of tissue left on the head  of the fibula was substantial.  The lateral meniscus was also repaired back  to capsule where it avulsed laterally and this was carried out prior to the  primary repair of those structures using #2 FiberWire as well.  The  appropriate position for a lateral epicondyle screw was then identified  fluoroscopically.  A longitudinal division was made in the fascia of the  lateralis and the tendon passed deep to this layer through a tunnel with  assistance of a Kelly clamp.  A screw with soft tissue washer was then  partially inserted and in figure-of-eight fashion, the tendon looped around  the screw, tensioned and then tightened down with the soft tissue washer.  This provided a very stout allograft reconstruction to back up the primary  repair.  This tendon was also oversewn were it crisscrossed with a #2  FiberWire.  Irrigation was performed, a deep drain was then placed in the  lateral aspect prevent hematoma accumulation around  the peroneal nerve and  then the wound closed in standard layered fashion.   At the conclusion of the case, the knee was examined while the patient  remained asleep and taken through a flexion up to 90 degrees and there was  no appreciable subluxation.  Similarly, a gentle varus stress was performed  at both full extension and 30 degrees of flexion.  There was no perceivable  instability nor opening.   The fixator was then retightened with slight flexion of the knee and sterile  dressings applied.  The patient was taken to the PACU in stable condition.   It should also be noted that during insertion of one of the screws in the  proximal tibia, the drill bit fractured.  The tip did not protrude past the  plate and was not retrievable without removal of the plate.  Decision was  made to forego that, as most of her hardware were in place.   PROGNOSIS:  Ms. Ludden has had a severe injury to her left knee and her  fracture dislocation has undergone primary repair.  We anticipate removal of the external fixator and initiation of motion in 2-4 weeks.  This is based  on the recommendation of Bethann Punches, who I obtained an extensive  consultation from preoperatively regarding a reconstruction of this injury.  Because of her size, she is not a suitable candidate for bracing.  Consequently, we have chosen to maintain the fixture in spite of the  potential complication of decreased range of motion.  She is at increased  risk for thromboembolic complications, given her size as well and will be on  either Coumadin or a short course of Lovenox.  She has been on low-molecular-  weight in the hospital thus far.  She is at increased risk for subsequent  instability because of her injury as well as arthrofibrosis and decreased of  range of motion.  Also, she is at increased risk for arthritis.  At this  time, her alignment appears appropriate,  her condylar width has been  restored and articular congruity  is acceptable as well.  Similarly, she has  restored ligamentous stability also.  These factors may protect her from a  more deleterious outcome.      Doralee Albino. Carola Frost, M.D.  Electronically Signed     MHH/MEDQ  D:  09/05/2005  T:  09/06/2005  Job:  [id number]

## 2011-09-25 LAB — URINALYSIS, ROUTINE W REFLEX MICROSCOPIC
Ketones, ur: 15 — AB
Nitrite: NEGATIVE
Urobilinogen, UA: 0.2
pH: 6

## 2011-09-25 LAB — WET PREP, GENITAL: Yeast Wet Prep HPF POC: NONE SEEN

## 2011-09-25 LAB — POCT PREGNANCY, URINE
Operator id: 12921
Preg Test, Ur: POSITIVE

## 2011-09-25 LAB — CBC
Hemoglobin: 10.1 — ABNORMAL LOW
MCHC: 33.1
RBC: 3.82 — ABNORMAL LOW
WBC: 10.4

## 2011-09-25 LAB — HCG, QUANTITATIVE, PREGNANCY: hCG, Beta Chain, Quant, S: 18640 — ABNORMAL HIGH

## 2011-10-02 LAB — URINALYSIS, ROUTINE W REFLEX MICROSCOPIC
Glucose, UA: NEGATIVE
Nitrite: NEGATIVE
Specific Gravity, Urine: 1.015
pH: 6.5

## 2011-10-03 LAB — URINALYSIS, ROUTINE W REFLEX MICROSCOPIC
Nitrite: NEGATIVE
Specific Gravity, Urine: 1.025 (ref 1.005–1.030)
Urobilinogen, UA: 0.2 mg/dL (ref 0.0–1.0)
pH: 6 (ref 5.0–8.0)

## 2011-12-12 ENCOUNTER — Other Ambulatory Visit: Payer: Self-pay | Admitting: Specialist

## 2011-12-12 DIAGNOSIS — N920 Excessive and frequent menstruation with regular cycle: Secondary | ICD-10-CM

## 2011-12-12 DIAGNOSIS — N946 Dysmenorrhea, unspecified: Secondary | ICD-10-CM

## 2011-12-16 ENCOUNTER — Ambulatory Visit
Admission: RE | Admit: 2011-12-16 | Discharge: 2011-12-16 | Disposition: A | Discharge status: Home / Self Care | Payer: Medicaid Other | Source: Ambulatory Visit | Attending: Specialist | Admitting: Specialist

## 2011-12-16 ENCOUNTER — Other Ambulatory Visit: Payer: Self-pay

## 2011-12-16 DIAGNOSIS — N946 Dysmenorrhea, unspecified: Secondary | ICD-10-CM

## 2011-12-16 DIAGNOSIS — N920 Excessive and frequent menstruation with regular cycle: Secondary | ICD-10-CM

## 2012-03-11 ENCOUNTER — Other Ambulatory Visit: Payer: Self-pay

## 2012-03-11 ENCOUNTER — Encounter (HOSPITAL_BASED_OUTPATIENT_CLINIC_OR_DEPARTMENT_OTHER): Payer: Self-pay

## 2012-03-11 ENCOUNTER — Emergency Department (HOSPITAL_BASED_OUTPATIENT_CLINIC_OR_DEPARTMENT_OTHER)
Admission: EM | Admit: 2012-03-11 | Discharge: 2012-03-11 | Disposition: A | Discharge status: Home Health | Payer: Medicare Other | Attending: Emergency Medicine | Admitting: Emergency Medicine

## 2012-03-11 DIAGNOSIS — S025XXA Fracture of tooth (traumatic), initial encounter for closed fracture: Secondary | ICD-10-CM | POA: Insufficient documentation

## 2012-03-11 DIAGNOSIS — R51 Headache: Secondary | ICD-10-CM | POA: Insufficient documentation

## 2012-03-11 DIAGNOSIS — I10 Essential (primary) hypertension: Secondary | ICD-10-CM | POA: Insufficient documentation

## 2012-03-11 DIAGNOSIS — X58XXXA Exposure to other specified factors, initial encounter: Secondary | ICD-10-CM | POA: Insufficient documentation

## 2012-03-11 DIAGNOSIS — R220 Localized swelling, mass and lump, head: Secondary | ICD-10-CM | POA: Insufficient documentation

## 2012-03-11 DIAGNOSIS — Z79899 Other long term (current) drug therapy: Secondary | ICD-10-CM | POA: Insufficient documentation

## 2012-03-11 DIAGNOSIS — J45909 Unspecified asthma, uncomplicated: Secondary | ICD-10-CM | POA: Insufficient documentation

## 2012-03-11 DIAGNOSIS — R5381 Other malaise: Secondary | ICD-10-CM | POA: Insufficient documentation

## 2012-03-11 HISTORY — DX: Essential (primary) hypertension: I10

## 2012-03-11 LAB — CBC
Hemoglobin: 10.6 g/dL — ABNORMAL LOW (ref 12.0–15.0)
MCHC: 32.9 g/dL (ref 30.0–36.0)
Platelets: 319 10*3/uL (ref 150–400)
RBC: 4.03 MIL/uL (ref 3.87–5.11)

## 2012-03-11 LAB — BASIC METABOLIC PANEL
BUN: 8 mg/dL (ref 6–23)
Chloride: 102 mEq/L (ref 96–112)
GFR calc Af Amer: >90 mL/min (ref >90)
GFR calc non Af Amer: >90 mL/min (ref >90)
Potassium: 3.3 mEq/L — ABNORMAL LOW (ref 3.5–5.1)
Sodium: 138 mEq/L (ref 135–145)

## 2012-03-11 LAB — DIFFERENTIAL
Basophils Relative: 0 % (ref 0–1)
Eosinophils Absolute: 0.3 10*3/uL (ref 0.0–0.7)
Lymphs Abs: 2.3 10*3/uL (ref 0.7–4.0)
Monocytes Relative: 9 % (ref 3–12)
Neutro Abs: 2.9 10*3/uL (ref 1.7–7.7)
Neutrophils Relative %: 47 % (ref 43–77)

## 2012-03-11 MED ORDER — CLINDAMYCIN HCL 150 MG PO CAPS: 150.0000 mg | ORAL_CAPSULE | Freq: Four times a day (QID) | ORAL | Status: AC

## 2012-03-11 MED ORDER — HYDROCODONE-ACETAMINOPHEN 5-325 MG PO TABS: 2.0000 | ORAL_TABLET | ORAL | Status: AC | PRN

## 2012-03-11 MED ORDER — CLINDAMYCIN HCL 150 MG PO CAPS: 150.0000 mg | ORAL_CAPSULE | Freq: Four times a day (QID) | ORAL | Status: DC

## 2012-03-11 NOTE — ED Notes (Addendum)
HA x 1 week-states she was dx with HTN and started on antihypertensives 8 days ago-also c/o toothache

## 2012-03-11 NOTE — Discharge Instructions (Signed)

## 2012-03-11 NOTE — ED Provider Notes (Signed)
History     CSN: [id number]  Arrival date & time 03/11/12  1948   First MD Initiated Contact with Patient 03/11/12 2117      Chief Complaint  Patient presents with  . Headache    (Consider location/radiation/quality/duration/timing/severity/associated sxs/prior treatment) Patient is a 36 y.o. female presenting with weakness. The history is provided by the patient. No language interpreter was used.  Weakness The primary symptoms include headaches. The symptoms are unchanged.  The headache is associated with weakness.  Additional symptoms include weakness. Medical issues also include hypertension.  Pt reports she sawDr. Mayford Knife recently and he put her on metoprolol.  Pt reports she feels bad since starting this medication.  Pt is worried about her heart.  Pt reports her Father had strokes and heart problems due to elevted blood pressure.  Pt was on hctz but was told metoprolol would treat her elevated heart rate.  Pt did not have any evaluation for tachycardia  Past Medical History  Diagnosis Date  . Asthma   . Hypertension     Past Surgical History  Procedure Date  . Leg surgery     No family history on file.  History  Substance Use Topics  . Smoking status: Never Smoker   . Smokeless tobacco: Not on file  . Alcohol Use: No    OB History    Grav Para Term Preterm Abortions TAB SAB Ect Mult Living                  Review of Systems  Neurological: Positive for weakness and headaches.  All other systems reviewed and are negative.    Allergies  Review of patient's allergies indicates no known allergies.  Home Medications   Current Outpatient Rx  Name Route Sig Dispense Refill  . HYDROCHLOROTHIAZIDE 25 MG PO TABS Oral Take 25 mg by mouth daily.    Marland Kitchen HYDROCODONE-APAP-DIETARY PROD 10-650 MG PO MISC Oral Take 1 tablet by mouth daily as needed. Patient takes this medication for pain.    . IBUPROFEN 800 MG PO TABS Oral Take 800 mg by mouth every 8 (eight) hours as  needed. Patient used this medication for headaches and leg pain.    Marland Kitchen METOPROLOL TARTRATE 25 MG PO TABS Oral Take 25 mg by mouth 2 (two) times daily.      BP 127/88  Pulse 88  Temp(Src) 97.5 F (36.4 C) (Oral)  Resp 20  SpO2 100%  LMP 03/03/2012  Physical Exam  Nursing note and vitals reviewed. Constitutional: She is oriented to person, place, and time. She appears well-developed and well-nourished.  HENT:  Head: Normocephalic and atraumatic.  Right Ear: External ear normal.  Left Ear: External ear normal.       Broken tooth,  Gum swelling,  Probable early abscess  Eyes: Conjunctivae and EOM are normal. Pupils are equal, round, and reactive to light.  Neck: Normal range of motion. Neck supple.  Cardiovascular: Normal rate.   Pulmonary/Chest: Effort normal.  Abdominal: Soft.  Musculoskeletal: Normal range of motion.  Neurological: She is alert and oriented to person, place, and time.  Skin: Skin is warm.  Psychiatric: She has a normal mood and affect.    ED Course  Procedures (including critical care time)  Labs Reviewed - No data to display No results found.   No diagnosis found.    MDM  Pt also reports pain in mouth.  Pt reports she had dental appointment but did not go,   Date: 03/11/2012  Rate: 89  Rhythm: normal sinus rhythm  QRS Axis: normal  Intervals: normal  ST/T Wave abnormalities: normal  Conduction Disutrbances:none  Narrative Interpretation:   Old EKG Reviewed: unchanged  Results for orders placed during the hospital encounter of 03/11/12  CBC      Component Value Range   WBC 6.1  4.0 - 10.5 (K/uL)   RBC 4.03  3.87 - 5.11 (MIL/uL)   Hemoglobin 10.6 (*) 12.0 - 15.0 (g/dL)   HCT 40.9 (*) 81.1 - 46.0 (%)   MCV 79.9  78.0 - 100.0 (fL)   MCH 26.3  26.0 - 34.0 (pg)   MCHC 32.9  30.0 - 36.0 (g/dL)   RDW 91.4 (*) 78.2 - 15.5 (%)   Platelets 319  150 - 400 (K/uL)  DIFFERENTIAL      Component Value Range   Neutrophils Relative 47  43 - 77 (%)    Neutro Abs 2.9  1.7 - 7.7 (K/uL)   Lymphocytes Relative 38  12 - 46 (%)   Lymphs Abs 2.3  0.7 - 4.0 (K/uL)   Monocytes Relative 9  3 - 12 (%)   Monocytes Absolute 0.6  0.1 - 1.0 (K/uL)   Eosinophils Relative 5  0 - 5 (%)   Eosinophils Absolute 0.3  0.0 - 0.7 (K/uL)   Basophils Relative 0  0 - 1 (%)   Basophils Absolute 0.0  0.0 - 0.1 (K/uL)  BASIC METABOLIC PANEL      Component Value Range   Sodium 138  135 - 145 (mEq/L)   Potassium 3.3 (*) 3.5 - 5.1 (mEq/L)   Chloride 102  96 - 112 (mEq/L)   CO2 27  19 - 32 (mEq/L)   Glucose, Bld 102 (*) 70 - 99 (mg/dL)   BUN 8  6 - 23 (mg/dL)   Creatinine, Ser 9.56  0.50 - 1.10 (mg/dL)   Calcium 9.3  8.4 - 21.3 (mg/dL)   GFR calc non Af Amer >90  >90 (mL/min)   GFR calc Af Amer >90  >90 (mL/min)   No results found.    Pt given rx for clindamycin for tooth.  I advised stop metoprolol.  EKg normal.  Hemoglobin 10.6  Lonia Skinner [place], [place] 03/11/12 2215  Lonia Skinner [place], [place] 03/14/12 1654  Lonia Skinner [place], [place] 03/14/12 1655

## 2012-03-11 NOTE — ED Notes (Signed)
Pt to desk requesting STD check. Pt informed to share concern for STD with provider when evaluated. Pt still awaiting provider evaluation.

## 2013-11-03 ENCOUNTER — Other Ambulatory Visit: Payer: Self-pay | Admitting: Radiology

## 2014-01-29 ENCOUNTER — Encounter (HOSPITAL_BASED_OUTPATIENT_CLINIC_OR_DEPARTMENT_OTHER): Payer: Self-pay | Admitting: Emergency Medicine

## 2014-01-29 ENCOUNTER — Emergency Department (HOSPITAL_BASED_OUTPATIENT_CLINIC_OR_DEPARTMENT_OTHER)
Admission: EM | Admit: 2014-01-29 | Discharge: 2014-01-29 | Disposition: A | Discharge status: Home / Self Care | Payer: Medicare Other | Attending: Emergency Medicine | Admitting: Emergency Medicine

## 2014-01-29 DIAGNOSIS — I10 Essential (primary) hypertension: Secondary | ICD-10-CM | POA: Insufficient documentation

## 2014-01-29 DIAGNOSIS — K0889 Other specified disorders of teeth and supporting structures: Secondary | ICD-10-CM

## 2014-01-29 DIAGNOSIS — R110 Nausea: Secondary | ICD-10-CM | POA: Insufficient documentation

## 2014-01-29 DIAGNOSIS — Z79899 Other long term (current) drug therapy: Secondary | ICD-10-CM | POA: Insufficient documentation

## 2014-01-29 DIAGNOSIS — K089 Disorder of teeth and supporting structures, unspecified: Secondary | ICD-10-CM | POA: Insufficient documentation

## 2014-01-29 DIAGNOSIS — K044 Acute apical periodontitis of pulpal origin: Secondary | ICD-10-CM | POA: Insufficient documentation

## 2014-01-29 DIAGNOSIS — K029 Dental caries, unspecified: Secondary | ICD-10-CM | POA: Insufficient documentation

## 2014-01-29 DIAGNOSIS — J45909 Unspecified asthma, uncomplicated: Secondary | ICD-10-CM | POA: Insufficient documentation

## 2014-01-29 MED ORDER — HYDROCODONE-ACETAMINOPHEN 5-325 MG PO TABS: 1.0000 | ORAL_TABLET | ORAL | Status: DC | PRN

## 2014-01-29 MED ORDER — PENICILLIN V POTASSIUM 500 MG PO TABS: 500.0000 mg | ORAL_TABLET | Freq: Three times a day (TID) | ORAL | Status: DC

## 2014-01-29 NOTE — ED Notes (Signed)
Rx x 2 given for penicillin and hydrocodone

## 2014-01-29 NOTE — ED Provider Notes (Signed)
Medical screening examination/treatment/procedure(s) were performed by non-physician practitioner and as supervising physician I was immediately available for consultation/collaboration.  EKG Interpretation   None         Quency Tober B. Karle Starch, MD 01/29/14 1904

## 2014-01-29 NOTE — ED Provider Notes (Signed)
CSN: [id number]     Arrival date & time 01/29/14  1545 History   First MD Initiated Contact with Patient 01/29/14 1618     Chief Complaint  Patient presents with  . Dental Pain   (Consider location/radiation/quality/duration/timing/severity/associated sxs/prior Treatment) HPI  Pt is a 38 yo female with c/o dental pain.  Pt states she has a hx of dental infections and caries leading to necessity of having the tooth pulled.  She states she was treated with ABX approx 2 months ago for an unspecified "tooth infection" in her upper left jaw.  She states she experienced some relief from the ABX, but has since began to have pain in that area again, along with pain in her lower right jaw.  She states she has experienced minimal relief from multiple OTC meds (orajel, goody powder, and ibuprofen).  Pt also states lying on a hot towel helps the pain, but drinking cold beverages greatly worsens the pain.  She denies numbness, tingling, fever, chest pain, and SOB. She reports some nausea but no vomiting.  Past Medical History  Diagnosis Date  . Asthma   . Hypertension    Past Surgical History  Procedure Laterality Date  . Leg surgery     No family history on file. History  Substance Use Topics  . Smoking status: Never Smoker   . Smokeless tobacco: Not on file  . Alcohol Use: No   OB History   Grav Para Term Preterm Abortions TAB SAB Ect Mult Living                 Review of Systems  Constitutional: Negative for fever and chills.  HENT: Positive for dental problem. Negative for facial swelling and trouble swallowing.   Respiratory: Negative.   Gastrointestinal: Positive for nausea. Negative for vomiting.    Allergies  Review of patient's allergies indicates no known allergies.  Home Medications   Current Outpatient Rx  Name  Route  Sig  Dispense  Refill  . hydrochlorothiazide (HYDRODIURIL) 25 MG tablet   Oral   Take 25 mg by mouth daily.         . Hydrocodone-APAP-Dietary Prod  (HYDROCODONE-APAP-NUTRIT SUPP) 10-650 MG MISC   Oral   Take 1 tablet by mouth daily as needed. Patient takes this medication for pain.         Marland Kitchen ibuprofen (ADVIL,MOTRIN) 800 MG tablet   Oral   Take 800 mg by mouth every 8 (eight) hours as needed. Patient used this medication for headaches and leg pain.         . metoprolol tartrate (LOPRESSOR) 25 MG tablet   Oral   Take 25 mg by mouth 2 (two) times daily.          BP 146/81  Pulse 74  Temp(Src) 98 F (36.7 C) (Oral)  Resp 16  Ht 5\' 9"  (1.753 m)  Wt 230 lb (104.327 kg)  BMI 33.95 kg/m2  SpO2 100%  LMP 01/19/2014 Physical Exam  Constitutional: She is oriented to person, place, and time. She appears well-developed and well-nourished.  HENT:  Generally good dentition with left upper alveolar tenderness. No pointing abscess. Right lower rear molar tenderness. No facial swelling.  Neck: Normal range of motion.  Pulmonary/Chest: Effort normal.  Neurological: She is alert and oriented to person, place, and time.  Skin: Skin is warm and dry.    ED Course  Procedures (including critical care time) Labs Review Labs Reviewed - No data to display Imaging Review No  results found.  EKG Interpretation   None       MDM  No diagnosis found. 1. Dental pain  Stable for discharge. Abx, pain medication.    Dewaine Oats, PA-C 01/29/14 1746

## 2014-01-29 NOTE — ED Notes (Signed)
Right lower/left upper dental pain since Thursday evening.  Hasn't been to a dentist lately.

## 2014-01-29 NOTE — Discharge Instructions (Signed)
Dental Care and Dentist Visits Dental care supports good overall health. Regular dental visits can also help you avoid dental pain, bleeding, infection, and other more serious health problems in the future. It is important to keep the mouth healthy because diseases in the teeth, gums, and other oral tissues can spread to other areas of the body. Some problems, such as diabetes, heart disease, and pre-term labor have been associated with poor oral health.  See your dentist every 6 months. If you experience emergency problems such as a toothache or broken tooth, go to the dentist right away. If you see your dentist regularly, you may catch problems early. It is easier to be treated for problems in the early stages.  WHAT TO EXPECT AT A DENTIST VISIT  Your dentist will look for many common oral health problems and recommend proper treatment. At your regular dental visit, you can expect:  Gentle cleaning of the teeth and gums. This includes scraping and polishing. This helps to remove the sticky substance around the teeth and gums (plaque). Plaque forms in the mouth shortly after eating. Over time, plaque hardens on the teeth as tartar. If tartar is not removed regularly, it can cause problems. Cleaning also helps remove stains.  Periodic X-rays. These pictures of the teeth and supporting bone will help your dentist assess the health of your teeth.  Periodic fluoride treatments. Fluoride is a natural mineral shown to help strengthen teeth. Fluoride treatmentinvolves applying a fluoride gel or varnish to the teeth. It is most commonly done in children.  Examination of the mouth, tongue, jaws, teeth, and gums to look for any oral health problems, such as:  Cavities (dental caries). This is decay on the tooth caused by plaque, sugar, and acid in the mouth. It is best to catch a cavity when it is small.  Inflammation of the gums caused by plaque buildup (gingivitis).  Problems with the mouth or malformed  or misaligned teeth.  Oral cancer or other diseases of the soft tissues or jaws. KEEP YOUR TEETH AND GUMS HEALTHY For healthy teeth and gums, follow these general guidelines as well as your dentist's specific advice:  Have your teeth professionally cleaned at the dentist every 6 months.  Brush twice daily with a fluoride toothpaste.  Floss your teeth daily.  Ask your dentist if you need fluoride supplements, treatments, or fluoride toothpaste.  Eat a healthy diet. Reduce foods and drinks with added sugar.  Avoid smoking. TREATMENT FOR ORAL HEALTH PROBLEMS If you have oral health problems, treatment varies depending on the conditions present in your teeth and gums.  Your caregiver will most likely recommend good oral hygiene at each visit.  For cavities, gingivitis, or other oral health disease, your caregiver will perform a procedure to treat the problem. This is typically done at a separate appointment. Sometimes your caregiver will refer you to another dental specialist for specific tooth problems or for surgery. SEEK IMMEDIATE DENTAL CARE IF:  You have pain, bleeding, or soreness in the gum, tooth, jaw, or mouth area.  A permanent tooth becomes loose or separated from the gum socket.  You experience a blow or injury to the mouth or jaw area. Document Released: 08/28/2011 Document Revised: 03/09/2012 Document Reviewed: 08/28/2011 [facility] Patient Information 2014 [place], [place].  Dental Pain Toothache is pain in or around a tooth. It may get worse with chewing or with cold or heat.  HOME CARE  Your dentist may use a numbing medicine during treatment. If so, you  may need to avoid eating until the medicine wears off. Ask your dentist about this. °· Only take medicine as told by your dentist or doctor. °· Avoid chewing food near the painful tooth until after all treatment is done. Ask your dentist about this. °GET HELP RIGHT AWAY IF:  °· The problem gets worse or new problems  appear. °· You have a fever. °· There is redness and puffiness (swelling) of the face, jaw, or neck. °· You cannot open your mouth. °· There is pain in the jaw. °· There is very bad pain that is not helped by medicine. °MAKE SURE YOU:  °· Understand these instructions. °· Will watch your condition. °· Will get help right away if you are not doing well or get worse. °Document Released: 06/03/2008 Document Revised: 03/09/2012 Document Reviewed: 06/03/2008 °[employer]® Patient Information ©2014 [employer]. ° °

## 2014-11-30 ENCOUNTER — Emergency Department (HOSPITAL_BASED_OUTPATIENT_CLINIC_OR_DEPARTMENT_OTHER): Payer: Medicare Other

## 2014-11-30 ENCOUNTER — Emergency Department (HOSPITAL_BASED_OUTPATIENT_CLINIC_OR_DEPARTMENT_OTHER)
Admission: EM | Admit: 2014-11-30 | Discharge: 2014-11-30 | Disposition: A | Discharge status: Home / Self Care | Payer: Medicare Other | Attending: Emergency Medicine | Admitting: Emergency Medicine

## 2014-11-30 ENCOUNTER — Encounter (HOSPITAL_BASED_OUTPATIENT_CLINIC_OR_DEPARTMENT_OTHER): Payer: Self-pay | Admitting: *Deleted

## 2014-11-30 DIAGNOSIS — J45901 Unspecified asthma with (acute) exacerbation: Secondary | ICD-10-CM | POA: Insufficient documentation

## 2014-11-30 DIAGNOSIS — M25512 Pain in left shoulder: Secondary | ICD-10-CM | POA: Diagnosis present

## 2014-11-30 DIAGNOSIS — I10 Essential (primary) hypertension: Secondary | ICD-10-CM | POA: Insufficient documentation

## 2014-11-30 DIAGNOSIS — M62838 Other muscle spasm: Secondary | ICD-10-CM | POA: Insufficient documentation

## 2014-11-30 DIAGNOSIS — Z79899 Other long term (current) drug therapy: Secondary | ICD-10-CM | POA: Diagnosis not present

## 2014-11-30 DIAGNOSIS — R079 Chest pain, unspecified: Secondary | ICD-10-CM | POA: Diagnosis not present

## 2014-11-30 DIAGNOSIS — Z792 Long term (current) use of antibiotics: Secondary | ICD-10-CM | POA: Diagnosis not present

## 2014-11-30 DIAGNOSIS — R0602 Shortness of breath: Secondary | ICD-10-CM

## 2014-11-30 LAB — CBC
HEMATOCRIT: 33 % — AB (ref 36.0–46.0)
Hemoglobin: 10.4 g/dL — ABNORMAL LOW (ref 12.0–15.0)
MCH: 25.9 pg — ABNORMAL LOW (ref 26.0–34.0)
MCHC: 31.5 g/dL (ref 30.0–36.0)
MCV: 82.3 fL (ref 78.0–100.0)
Platelets: 314 10*3/uL (ref 150–400)
RBC: 4.01 MIL/uL (ref 3.87–5.11)
RDW: 16.4 % — AB (ref 11.5–15.5)
WBC: 3.8 10*3/uL — ABNORMAL LOW (ref 4.0–10.5)

## 2014-11-30 LAB — PRO B NATRIURETIC PEPTIDE: PRO B NATRI PEPTIDE: 11.3 pg/mL (ref 0–125)

## 2014-11-30 LAB — BASIC METABOLIC PANEL
ANION GAP: 12 (ref 5–15)
BUN: 9 mg/dL (ref 6–23)
CALCIUM: 9.6 mg/dL (ref 8.4–10.5)
CHLORIDE: 101 meq/L (ref 96–112)
CO2: 26 meq/L (ref 19–32)
Creatinine, Ser: 0.7 mg/dL (ref 0.50–1.10)
GFR calc Af Amer: >90 mL/min (ref >90)
GFR calc non Af Amer: >90 mL/min (ref >90)
Glucose, Bld: 112 mg/dL — ABNORMAL HIGH (ref 70–99)
Potassium: 3.3 mEq/L — ABNORMAL LOW (ref 3.7–5.3)
SODIUM: 139 meq/L (ref 137–147)

## 2014-11-30 MED ORDER — METHOCARBAMOL 500 MG PO TABS: 500.0000 mg | ORAL_TABLET | Freq: Two times a day (BID) | ORAL | Status: DC

## 2014-11-30 NOTE — Discharge Instructions (Signed)
1. Medications: Robaxin for your left shoulder spasm, usual home medications 2. Treatment: rest, drink plenty of fluids,  3. Follow Up: Please followup with your primary doctor in 7 days for discussion of your diagnoses and further evaluation after today's visit; if you do not have a primary care doctor use the resource guide provided to find one; Please return to the ER for chest pain with exertion or rest, worsening shortness of breath or other concerning symptoms

## 2014-11-30 NOTE — ED Provider Notes (Signed)
CSN: [id number]     Arrival date & time 11/30/14  1836 History   First MD Initiated Contact with Patient 11/30/14 1950     Chief Complaint  Patient presents with  . Shoulder Pain     (Consider location/radiation/quality/duration/timing/severity/associated sxs/prior Treatment) Patient is a 38 y.o. female presenting with shoulder pain. The history is provided by the patient and medical records. No language interpreter was used.  Shoulder Pain Associated symptoms: no back pain, no fatigue and no fever      Kristen Swanson is a 38 y.o. female  with a hx of asthma, hypertension presents to the Emergency Department with multiple complaints. Patient initially complains of left shoulder pain, worse with movement and intermittent over the last several months. No falls or known injuries. Patient reports the pain is in the superior and posterior shoulder. No treatment prior to arrival and no alleviating factors.  Patient also complains of PND and orthopnea.  Patient reports that when she lies back she developed chest heaviness and shortness of breath. She reports that this resolves with sitting upright or sleeping on pillows. Patient denies chest pain at rest or with exertion. She also denies shortness of breath at rest or with exertion. She denies history of cardiac problems. She reports that her father died from a stroke but did not have cardiac problems. She reports intermittent swelling of her legs, they are not swollen now. She takes HCTZ for her hypertension.   Past Medical History  Diagnosis Date  . Asthma   . Hypertension    Past Surgical History  Procedure Laterality Date  . Leg surgery     History reviewed. No pertinent family history. History  Substance Use Topics  . Smoking status: Never Smoker   . Smokeless tobacco: Not on file  . Alcohol Use: No   OB History    No data available     Review of Systems  Constitutional: Negative for fever, diaphoresis, appetite change, fatigue  and unexpected weight change.  HENT: Negative for mouth sores.   Eyes: Negative for visual disturbance.  Respiratory: Positive for shortness of breath (Orthopnea). Negative for cough, chest tightness and wheezing.   Cardiovascular: Positive for chest pain ( heaviness).  Gastrointestinal: Negative for nausea, vomiting, abdominal pain, diarrhea and constipation.  Endocrine: Negative for polydipsia, polyphagia and polyuria.  Genitourinary: Negative for dysuria, urgency, frequency and hematuria.  Musculoskeletal: Positive for arthralgias (Left shoulder). Negative for back pain and neck stiffness.  Skin: Negative for rash.  Allergic/Immunologic: Negative for immunocompromised state.  Neurological: Negative for syncope, light-headedness and headaches.  Hematological: Does not bruise/bleed easily.  Psychiatric/Behavioral: Negative for sleep disturbance. The patient is not nervous/anxious.       Allergies  Review of patient's allergies indicates no known allergies.  Home Medications   Prior to Admission medications   Medication Sig Start Date End Date Taking? Authorizing Provider  hydrochlorothiazide (HYDRODIURIL) 25 MG tablet Take 25 mg by mouth daily.    Historical Provider, MD  HYDROcodone-acetaminophen (NORCO/VICODIN) 5-325 MG per tablet Take 1-2 tablets by mouth every 4 (four) hours as needed. 01/29/14   Shari A Upstill, PA-C  Hydrocodone-APAP-Dietary Prod (HYDROCODONE-APAP-NUTRIT SUPP) 10-650 MG MISC Take 1 tablet by mouth daily as needed. Patient takes this medication for pain.    Historical Provider, MD  ibuprofen (ADVIL,MOTRIN) 800 MG tablet Take 800 mg by mouth every 8 (eight) hours as needed. Patient used this medication for headaches and leg pain.    Historical Provider, MD  methocarbamol (ROBAXIN)  500 MG tablet Take 1 tablet (500 mg total) by mouth 2 (two) times daily. 11/30/14   Kevonta Phariss, PA-C  metoprolol tartrate (LOPRESSOR) 25 MG tablet Take 25 mg by mouth 2 (two) times  daily.    Historical Provider, MD  penicillin v potassium (VEETID) 500 MG tablet Take 1 tablet (500 mg total) by mouth 3 (three) times daily. 01/29/14   Shari A Upstill, PA-C   BP 131/95 mmHg  Pulse 70  Temp(Src) 98.2 F (36.8 C) (Oral)  Resp 16  Ht 5\' 10"  (1.778 m)  Wt 240 lb (108.863 kg)  BMI 34.44 kg/m2  SpO2 98%  LMP 11/23/2014 Physical Exam  Constitutional: She appears well-developed and well-nourished. No distress.  Awake, alert, nontoxic appearance  HENT:  Head: Normocephalic and atraumatic.  Mouth/Throat: Oropharynx is clear and moist. No oropharyngeal exudate.  Eyes: Conjunctivae are normal. No scleral icterus.  Neck: Normal range of motion. Neck supple.  Full range motion without pain No midline or paraspinal tenderness  Cardiovascular: Normal rate, regular rhythm, normal heart sounds and intact distal pulses.   No murmur heard. Capillary refill less than 3 seconds  Pulmonary/Chest: Effort normal and breath sounds normal. No respiratory distress. She has no wheezes.  Equal chest expansion Clear and equal breath sounds  Abdominal: Soft. Bowel sounds are normal. She exhibits no mass. There is no tenderness. There is no rebound and no guarding.  Musculoskeletal: Normal range of motion. She exhibits tenderness. She exhibits no edema.  ROM: Full range of motion of the left shoulder without pain Tenderness to palpation of the left trapezius No peripheral edema  Neurological: She is alert. Coordination normal.  Speech is clear and goal oriented Moves extremities without ataxia Strength 5 out of 5 in the bilateral upper extremities  Skin: Skin is warm and dry. She is not diaphoretic.  No tenting of the skin  Psychiatric: She has a normal mood and affect.  Nursing note and vitals reviewed.   ED Course  Procedures (including critical care time) Labs Review Labs Reviewed  CBC - Abnormal; Notable for the following:    WBC 3.8 (*)    Hemoglobin 10.4 (*)    HCT 33.0 (*)     MCH 25.9 (*)    RDW 16.4 (*)    All other components within normal limits  BASIC METABOLIC PANEL - Abnormal; Notable for the following:    Potassium 3.3 (*)    Glucose, Bld 112 (*)    All other components within normal limits  PRO B NATRIURETIC PEPTIDE    Imaging Review Dg Chest 2 View  11/30/2014   CLINICAL DATA:  Short of breath and chest pain for 1 day  EXAM: CHEST  2 VIEW  COMPARISON:  09/05/2005  FINDINGS: Normal heart size. Low lung volumes. Bibasilar atelectasis. No pleural effusion or pneumothorax.  IMPRESSION: Bibasilar atelectasis.   Electronically Signed   By: Maryclare Bean M.D.   On: 11/30/2014 21:29     EKG Interpretation None      MDM   Final diagnoses:  Chest pain  SOB (shortness of breath)  Trapezius muscle spasm   Kristen Swanson presents endorsing a history of orthopnea and a history of hypertension. Will begin rule out for pulmonary edema or congestive heart failure. Patient has chest pain and short of breath free at this time. Concern for ACS.  Patient also with complaints of left shoulder pain. On exam patient with tenderness to palpation of the left trapezius and palpable spasm. Will  treat with Robaxin.  9:44 PM  Chest pain is not likely of cardiac or pulmonary etiology d/t presentation, PERC negative, VSS, no tracheal deviation, no JVD or new murmur, RRR, breath sounds equal bilaterally, EKG without acute abnormalities, normal BNP and negative CXR. No evidence of congestive heart failure or pulmonary edema.  Pt has been advised to return to the ED if CP becomes exertional, associated with diaphoresis or nausea, radiates to left jaw/arm, worsens or becomes concerning in any way. Pt appears reliable for follow up and is agreeable to discharge.   Case has been discussed with Dr. Stark Jock who agrees with the above plan to discharge.   I have personally reviewed patient's vitals, nursing note and any pertinent labs or imaging.  I performed an undressed physical exam.     It has been determined that no acute conditions requiring further emergency intervention are present at this time. The patient/guardian have been advised of the diagnosis and plan. I reviewed all labs and imaging including any potential incidental findings. We have discussed signs and symptoms that warrant return to the ED and they are listed in the discharge instructions.    Vital signs are stable at discharge.   BP 131/95 mmHg  Pulse 70  Temp(Src) 98.2 F (36.8 C) (Oral)  Resp 16  Ht 5\' 10"  (1.778 m)  Wt 240 lb (108.863 kg)  BMI 34.44 kg/m2  SpO2 98%  LMP 11/23/2014       Boden Stucky, PA-C 11/30/14 2148  Veryl Speak, MD 11/30/14 2247

## 2014-11-30 NOTE — ED Notes (Signed)
Pt c/o left shoulder pain with SOB and chest pain x 1 day

## 2015-01-18 ENCOUNTER — Encounter (HOSPITAL_BASED_OUTPATIENT_CLINIC_OR_DEPARTMENT_OTHER): Payer: Self-pay | Admitting: *Deleted

## 2015-01-18 ENCOUNTER — Emergency Department (HOSPITAL_BASED_OUTPATIENT_CLINIC_OR_DEPARTMENT_OTHER): Payer: Medicare Other

## 2015-01-18 ENCOUNTER — Emergency Department (HOSPITAL_BASED_OUTPATIENT_CLINIC_OR_DEPARTMENT_OTHER)
Admission: EM | Admit: 2015-01-18 | Discharge: 2015-01-18 | Disposition: A | Discharge status: Home / Self Care | Payer: Medicare Other | Attending: Emergency Medicine | Admitting: Emergency Medicine

## 2015-01-18 DIAGNOSIS — Z792 Long term (current) use of antibiotics: Secondary | ICD-10-CM | POA: Diagnosis not present

## 2015-01-18 DIAGNOSIS — S8992XA Unspecified injury of left lower leg, initial encounter: Secondary | ICD-10-CM | POA: Insufficient documentation

## 2015-01-18 DIAGNOSIS — Y998 Other external cause status: Secondary | ICD-10-CM | POA: Insufficient documentation

## 2015-01-18 DIAGNOSIS — Y92410 Unspecified street and highway as the place of occurrence of the external cause: Secondary | ICD-10-CM | POA: Diagnosis not present

## 2015-01-18 DIAGNOSIS — Z79899 Other long term (current) drug therapy: Secondary | ICD-10-CM | POA: Insufficient documentation

## 2015-01-18 DIAGNOSIS — J45909 Unspecified asthma, uncomplicated: Secondary | ICD-10-CM | POA: Insufficient documentation

## 2015-01-18 DIAGNOSIS — I10 Essential (primary) hypertension: Secondary | ICD-10-CM | POA: Diagnosis not present

## 2015-01-18 DIAGNOSIS — Y9389 Activity, other specified: Secondary | ICD-10-CM | POA: Diagnosis not present

## 2015-01-18 MED ORDER — TRAMADOL HCL 50 MG PO TABS: 50.0000 mg | ORAL_TABLET | Freq: Four times a day (QID) | ORAL | Status: DC | PRN

## 2015-01-18 MED ORDER — CYCLOBENZAPRINE HCL 10 MG PO TABS: 10.0000 mg | ORAL_TABLET | Freq: Two times a day (BID) | ORAL | Status: DC | PRN

## 2015-01-18 MED ORDER — ACETAMINOPHEN 325 MG PO TABS
650.0000 mg | ORAL_TABLET | Freq: Once | ORAL | Status: AC
Start: 2015-01-18 — End: 2015-01-18
  Administered 2015-01-18: 650 mg via ORAL
  Filled 2015-01-18: qty 2
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
## 2015-01-18 NOTE — Discharge Instructions (Signed)

## 2015-01-18 NOTE — ED Notes (Signed)
PA at bedside.
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
## 2015-01-31 ENCOUNTER — Emergency Department (HOSPITAL_BASED_OUTPATIENT_CLINIC_OR_DEPARTMENT_OTHER)
Admission: EM | Admit: 2015-01-31 | Discharge: 2015-01-31 | Disposition: A | Discharge status: Home / Self Care | Payer: Medicare Other | Attending: Emergency Medicine | Admitting: Emergency Medicine

## 2015-01-31 ENCOUNTER — Encounter (HOSPITAL_BASED_OUTPATIENT_CLINIC_OR_DEPARTMENT_OTHER): Payer: Self-pay | Admitting: *Deleted

## 2015-01-31 ENCOUNTER — Emergency Department (HOSPITAL_BASED_OUTPATIENT_CLINIC_OR_DEPARTMENT_OTHER): Payer: Medicare Other

## 2015-01-31 DIAGNOSIS — J45909 Unspecified asthma, uncomplicated: Secondary | ICD-10-CM | POA: Insufficient documentation

## 2015-01-31 DIAGNOSIS — I10 Essential (primary) hypertension: Secondary | ICD-10-CM | POA: Diagnosis not present

## 2015-01-31 DIAGNOSIS — Z79899 Other long term (current) drug therapy: Secondary | ICD-10-CM | POA: Insufficient documentation

## 2015-01-31 DIAGNOSIS — R079 Chest pain, unspecified: Secondary | ICD-10-CM | POA: Diagnosis not present

## 2015-01-31 DIAGNOSIS — Z792 Long term (current) use of antibiotics: Secondary | ICD-10-CM | POA: Insufficient documentation

## 2015-01-31 LAB — COMPREHENSIVE METABOLIC PANEL
ALBUMIN: 3.6 g/dL (ref 3.5–5.2)
ALK PHOS: 62 U/L (ref 39–117)
ALT: 10 U/L (ref 0–35)
AST: 23 U/L (ref 0–37)
Anion gap: 6 (ref 5–15)
BILIRUBIN TOTAL: 0.5 mg/dL (ref 0.3–1.2)
BUN: 11 mg/dL (ref 6–23)
CO2: 27 mmol/L (ref 19–32)
Calcium: 9.5 mg/dL (ref 8.4–10.5)
Chloride: 103 mmol/L (ref 96–112)
Creatinine, Ser: 0.72 mg/dL (ref 0.50–1.10)
GFR calc Af Amer: >90 mL/min (ref >90)
Glucose, Bld: 95 mg/dL (ref 70–99)
POTASSIUM: 3.2 mmol/L — AB (ref 3.5–5.1)
SODIUM: 136 mmol/L (ref 135–145)
Total Protein: 8.7 g/dL — ABNORMAL HIGH (ref 6.0–8.3)

## 2015-01-31 LAB — CBC WITH DIFFERENTIAL/PLATELET
BASOS PCT: 0 % (ref 0–1)
Basophils Absolute: 0 10*3/uL (ref 0.0–0.1)
Eosinophils Absolute: 0.4 10*3/uL (ref 0.0–0.7)
Eosinophils Relative: 8 % — ABNORMAL HIGH (ref 0–5)
HCT: 35.2 % — ABNORMAL LOW (ref 36.0–46.0)
HEMOGLOBIN: 11.2 g/dL — AB (ref 12.0–15.0)
LYMPHS PCT: 31 % (ref 12–46)
Lymphs Abs: 1.5 10*3/uL (ref 0.7–4.0)
MCH: 24.9 pg — AB (ref 26.0–34.0)
MCHC: 31.8 g/dL (ref 30.0–36.0)
MCV: 78.4 fL (ref 78.0–100.0)
MONOS PCT: 13 % — AB (ref 3–12)
Monocytes Absolute: 0.7 10*3/uL (ref 0.1–1.0)
Neutro Abs: 2.4 10*3/uL (ref 1.7–7.7)
Neutrophils Relative %: 48 % (ref 43–77)
PLATELETS: 326 10*3/uL (ref 150–400)
RBC: 4.49 MIL/uL (ref 3.87–5.11)
RDW: 16 % — ABNORMAL HIGH (ref 11.5–15.5)
WBC: 5 10*3/uL (ref 4.0–10.5)

## 2015-01-31 LAB — TROPONIN I

## 2015-01-31 NOTE — Discharge Instructions (Signed)
Ibuprofen 600 mg every 6 hours as needed for pain.  Return to the emergency department if your symptoms substantially worsen or change.   Chest Pain (Nonspecific) It is often hard to give a specific diagnosis for the cause of chest pain. There is always a chance that your pain could be related to something serious, such as a heart attack or a blood clot in the lungs. You need to follow up with your health care provider for further evaluation. CAUSES   Heartburn.  Pneumonia or bronchitis.  Anxiety or stress.  Inflammation around your heart (pericarditis) or lung (pleuritis or pleurisy).  A blood clot in the lung.  A collapsed lung (pneumothorax). It can develop suddenly on its own (spontaneous pneumothorax) or from trauma to the chest.  Shingles infection (herpes zoster virus). The chest wall is composed of bones, muscles, and cartilage. Any of these can be the source of the pain.  The bones can be bruised by injury.  The muscles or cartilage can be strained by coughing or overwork.  The cartilage can be affected by inflammation and become sore (costochondritis). DIAGNOSIS  Lab tests or other studies may be needed to find the cause of your pain. Your health care provider may have you take a test called an ambulatory electrocardiogram (ECG). An ECG records your heartbeat patterns over a 24-hour period. You may also have other tests, such as:  Transthoracic echocardiogram (TTE). During echocardiography, sound waves are used to evaluate how blood flows through your heart.  Transesophageal echocardiogram (TEE).  Cardiac monitoring. This allows your health care provider to monitor your heart rate and rhythm in real time.  Holter monitor. This is a portable device that records your heartbeat and can help diagnose heart arrhythmias. It allows your health care provider to track your heart activity for several days, if needed.  Stress tests by exercise or by giving medicine that makes  the heart beat faster. TREATMENT   Treatment depends on what may be causing your chest pain. Treatment may include:  Acid blockers for heartburn.  Anti-inflammatory medicine.  Pain medicine for inflammatory conditions.  Antibiotics if an infection is present.  You may be advised to change lifestyle habits. This includes stopping smoking and avoiding alcohol, caffeine, and chocolate.  You may be advised to keep your head raised (elevated) when sleeping. This reduces the chance of acid going backward from your stomach into your esophagus. Most of the time, nonspecific chest pain will improve within 2-3 days with rest and mild pain medicine.  HOME CARE INSTRUCTIONS   If antibiotics were prescribed, take them as directed. Finish them even if you start to feel better.  For the next few days, avoid physical activities that bring on chest pain. Continue physical activities as directed.  Do not use any tobacco products, including cigarettes, chewing tobacco, or electronic cigarettes.  Avoid drinking alcohol.  Only take medicine as directed by your health care provider.  Follow your health care provider's suggestions for further testing if your chest pain does not go away.  Keep any follow-up appointments you made. If you do not go to an appointment, you could develop lasting (chronic) problems with pain. If there is any problem keeping an appointment, call to reschedule. SEEK MEDICAL CARE IF:   Your chest pain does not go away, even after treatment.  You have a rash with blisters on your chest.  You have a fever. SEEK IMMEDIATE MEDICAL CARE IF:   You have increased chest pain or pain  that spreads to your arm, neck, jaw, back, or abdomen.  You have shortness of breath.  You have an increasing cough, or you cough up blood.  You have severe back or abdominal pain.  You feel nauseous or vomit.  You have severe weakness.  You faint.  You have chills. This is an emergency.  Do not wait to see if the pain will go away. Get medical help at once. Call your local emergency services (911 in [place] Do not drive yourself to the hospital. MAKE SURE YOU:   Understand these instructions.  Will watch your condition.  Will get help right away if you are not doing well or get worse. Document Released: 09/25/2005 Document Revised: 12/21/2013 Document Reviewed: 07/21/2008 [facility] Patient Information 2015 [place], [place]. This information is not intended to replace advice given to you by your health care provider. Make sure you discuss any questions you have with your health care provider.

## 2015-01-31 NOTE — ED Notes (Signed)
Patient preparing for discharge. 

## 2015-01-31 NOTE — ED Notes (Signed)
C/o left sided c/p. Bil shoulder and right arm pain. H/a and coughing nonproductive. Onset 2 days ago. C/o sob with lying back and can feel heart thumping in chest.

## 2015-01-31 NOTE — ED Provider Notes (Signed)
CSN: [id number]     Arrival date & time 01/31/15  0906 History   First MD Initiated Contact with Patient 01/31/15 0935     Chief Complaint  Patient presents with  . Chest Pain     (Consider location/radiation/quality/duration/timing/severity/associated sxs/prior Treatment) HPI Comments: Patient is a 39 year old female with no prior cardiac history. She presents with complaints of a "pounding" in her chest that has been occurring intermittently all night. This is worse with lying flat and relieved with sitting up. She reports breathing difficulty when she lies flat as well. She denies any nausea or vomiting. She denies any diaphoresis.  Patient is a 39 y.o. female presenting with chest pain. The history is provided by the patient.  Chest Pain Pain location:  Substernal area Pain quality comment:  Pounding Pain radiates to:  Does not radiate Pain radiates to the back: no   Pain severity:  Moderate Onset quality:  Sudden Duration:  12 hours Timing:  Intermittent Chronicity:  New   Past Medical History  Diagnosis Date  . Asthma   . Hypertension    Past Surgical History  Procedure Laterality Date  . Leg surgery     No family history on file. History  Substance Use Topics  . Smoking status: Never Smoker   . Smokeless tobacco: Not on file  . Alcohol Use: No   OB History    No data available     Review of Systems  Cardiovascular: Positive for chest pain.  All other systems reviewed and are negative.     Allergies  Review of patient's allergies indicates no known allergies.  Home Medications   Prior to Admission medications   Medication Sig Start Date End Date Taking? Authorizing Provider  hydrochlorothiazide (HYDRODIURIL) 25 MG tablet Take 25 mg by mouth daily.   Yes Historical Provider, MD  HYDROcodone-acetaminophen (NORCO/VICODIN) 5-325 MG per tablet Take 1-2 tablets by mouth every 4 (four) hours as needed. 01/29/14  Yes Shari A Upstill, PA-C   Hydrocodone-APAP-Dietary Prod (HYDROCODONE-APAP-NUTRIT SUPP) 10-650 MG MISC Take 1 tablet by mouth daily as needed. Patient takes this medication for pain.   Yes Historical Provider, MD  ibuprofen (ADVIL,MOTRIN) 800 MG tablet Take 800 mg by mouth every 8 (eight) hours as needed. Patient used this medication for headaches and leg pain.   Yes Historical Provider, MD  traMADol (ULTRAM) 50 MG tablet Take 1 tablet (50 mg total) by mouth every 6 (six) hours as needed. 01/18/15  Yes Tiffany Marilu Favre, PA-C  cyclobenzaprine (FLEXERIL) 10 MG tablet Take 1 tablet (10 mg total) by mouth 2 (two) times daily as needed for muscle spasms. 01/18/15   Tiffany Marilu Favre, PA-C  methocarbamol (ROBAXIN) 500 MG tablet Take 1 tablet (500 mg total) by mouth 2 (two) times daily. 11/30/14   Hannah Muthersbaugh, PA-C  metoprolol tartrate (LOPRESSOR) 25 MG tablet Take 25 mg by mouth 2 (two) times daily.    Historical Provider, MD  penicillin v potassium (VEETID) 500 MG tablet Take 1 tablet (500 mg total) by mouth 3 (three) times daily. 01/29/14   Shari A Upstill, PA-C   BP 132/74 mmHg  Pulse 84  Temp(Src) 98.4 F (36.9 C) (Oral)  Resp 16  SpO2 100%  LMP 01/16/2015 Physical Exam  Constitutional: She is oriented to person, place, and time. She appears well-developed and well-nourished. No distress.  HENT:  Head: Normocephalic and atraumatic.  Neck: Normal range of motion. Neck supple.  Cardiovascular: Normal rate, regular rhythm and normal heart sounds.  Exam reveals no gallop and no friction rub.   No murmur heard. Pulmonary/Chest: Effort normal and breath sounds normal. No respiratory distress. She has no wheezes.  Abdominal: Soft. Bowel sounds are normal. She exhibits no distension. There is no tenderness.  Musculoskeletal: Normal range of motion.  Neurological: She is alert and oriented to person, place, and time.  Skin: Skin is warm and dry. She is not diaphoretic.  Nursing note and vitals reviewed.   ED Course   Procedures (including critical care time) Labs Review Labs Reviewed  CBC WITH DIFFERENTIAL/PLATELET  COMPREHENSIVE METABOLIC PANEL  TROPONIN I    Imaging Review No results found.   Date: 01/31/2015  Rate: 85  Rhythm: normal sinus rhythm  QRS Axis: normal  Intervals: normal  ST/T Wave abnormalities: normal  Conduction Disutrbances:none  Narrative Interpretation:   Old EKG Reviewed: none available    MDM   Final diagnoses:  None    Patient is a 39 year old female who presents with complaints of chest discomfort and "heart pounding" that occurs when she lies flat. Her cardiac workup is negative and she appears in no distress. While in the emergency department, I laid her bed flat and she experienced the symptoms she was describing. There is no ectopy on the monitor and nothing significantly changed. I am uncertain as to the etiology of her symptoms, however I highly doubt a cardiac etiology. This may well be anxiety induced. She is now feeling fine and will be discharged to home with when necessary return.    Veryl Speak, MD 01/31/15 [id number]

## 2015-01-31 NOTE — ED Notes (Signed)
Pt sister Derwood Kaplan called from [place] concerned about pt. I talked with pt prior to taking sisters call. Pt states it is okay to talk with her sister about her care. Pt sister aware tests are ordered and results have not come back yet and pt will call her sister when MD lets her know something. Pt sister verbilized understanding and thanked Therapist, sports for talking with her.

## 2015-05-14 ENCOUNTER — Emergency Department (HOSPITAL_BASED_OUTPATIENT_CLINIC_OR_DEPARTMENT_OTHER)
Admission: EM | Admit: 2015-05-14 | Discharge: 2015-05-14 | Disposition: A | Discharge status: Home / Self Care | Payer: Medicare Other | Attending: Emergency Medicine | Admitting: Emergency Medicine

## 2015-05-14 ENCOUNTER — Encounter (HOSPITAL_BASED_OUTPATIENT_CLINIC_OR_DEPARTMENT_OTHER): Payer: Self-pay | Admitting: Emergency Medicine

## 2015-05-14 DIAGNOSIS — I10 Essential (primary) hypertension: Secondary | ICD-10-CM | POA: Insufficient documentation

## 2015-05-14 DIAGNOSIS — Z79899 Other long term (current) drug therapy: Secondary | ICD-10-CM | POA: Insufficient documentation

## 2015-05-14 DIAGNOSIS — J029 Acute pharyngitis, unspecified: Secondary | ICD-10-CM

## 2015-05-14 DIAGNOSIS — Z792 Long term (current) use of antibiotics: Secondary | ICD-10-CM | POA: Diagnosis not present

## 2015-05-14 DIAGNOSIS — J45909 Unspecified asthma, uncomplicated: Secondary | ICD-10-CM | POA: Diagnosis not present

## 2015-05-14 LAB — RAPID STREP SCREEN (MED CTR MEBANE ONLY): Streptococcus, Group A Screen (Direct): NEGATIVE

## 2015-05-14 NOTE — Discharge Instructions (Signed)
Return to the ED with any concerns including difficulty breathing or swallowing, vomiting and not able to keep down liquids, decreased level of alertness/lethargy, or any other alarming symptoms

## 2015-05-14 NOTE — ED Notes (Signed)
Patient has history of daughter of strep throat. Patient reports that her throat is hurting today

## 2015-05-14 NOTE — ED Provider Notes (Signed)
CSN: [id number]     Arrival date & time 05/14/15  1512 History   This chart was scribed for Alfonzo Beers, MD by Chester Holstein, ED Scribe. This patient was seen in room MH07/MH07 and the patient's care was started at 4:47 PM.      Chief Complaint  Patient presents with  . Sore Throat     The history is provided by the patient. No language interpreter was used.   HPI Comments: Kristen Swanson is a 39 y.o. female who presents to the Emergency Department complaining of sore throat with onset 2 days ago. Pt reports associated cough due to post nasal drrip and congestion. Pt has tried lozenges with no relief. Pt's two daughters are also presenting with sore throats. One daughter was dx with strep 3 weeks ago but has finished her course of Abx. Pt denies fever and vomiting.   No difficulty breathing or swallowing.  She has continued to drink liquids well.  There are no other associated systemic symptoms, there are no other alleviating or modifying factors.   Past Medical History  Diagnosis Date  . Asthma   . Hypertension    Past Surgical History  Procedure Laterality Date  . Leg surgery     History reviewed. No pertinent family history. History  Substance Use Topics  . Smoking status: Never Smoker   . Smokeless tobacco: Not on file  . Alcohol Use: No   OB History    No data available     Review of Systems  Constitutional: Negative for fever.  HENT: Positive for congestion, postnasal drip and sore throat.   Respiratory: Positive for cough.   Gastrointestinal: Negative for vomiting.  ROS reviewed and all otherwise negative except for mentioned in HPI    Allergies  Review of patient's allergies indicates no known allergies.  Home Medications   Prior to Admission medications   Medication Sig Start Date End Date Taking? Authorizing Provider  cyclobenzaprine (FLEXERIL) 10 MG tablet Take 1 tablet (10 mg total) by mouth 2 (two) times daily as needed for muscle spasms. 01/18/15    Tiffany Carlota Raspberry, PA-C  hydrochlorothiazide (HYDRODIURIL) 25 MG tablet Take 25 mg by mouth daily.    Historical Provider, MD  HYDROcodone-acetaminophen (NORCO/VICODIN) 5-325 MG per tablet Take 1-2 tablets by mouth every 4 (four) hours as needed. 01/29/14   Charlann Lange, PA-C  Hydrocodone-APAP-Dietary Prod (HYDROCODONE-APAP-NUTRIT SUPP) 10-650 MG MISC Take 1 tablet by mouth daily as needed. Patient takes this medication for pain.    Historical Provider, MD  ibuprofen (ADVIL,MOTRIN) 800 MG tablet Take 800 mg by mouth every 8 (eight) hours as needed. Patient used this medication for headaches and leg pain.    Historical Provider, MD  methocarbamol (ROBAXIN) 500 MG tablet Take 1 tablet (500 mg total) by mouth 2 (two) times daily. 11/30/14   Hannah Muthersbaugh, PA-C  metoprolol tartrate (LOPRESSOR) 25 MG tablet Take 25 mg by mouth 2 (two) times daily.    Historical Provider, MD  penicillin v potassium (VEETID) 500 MG tablet Take 1 tablet (500 mg total) by mouth 3 (three) times daily. 01/29/14   Charlann Lange, PA-C  traMADol (ULTRAM) 50 MG tablet Take 1 tablet (50 mg total) by mouth every 6 (six) hours as needed. 01/18/15   Tiffany Carlota Raspberry, PA-C   BP 113/63 mmHg  Pulse 77  Temp(Src) 98.3 F (36.8 C) (Oral)  Resp 18  Ht 5\' 9"  (1.753 m)  Wt 220 lb (99.791 kg)  BMI 32.47 kg/m2  SpO2  100%  LMP 04/30/2015  Vitals reviewed Physical Exam  Physical Examination: General appearance - alert, well appearing, and in no distress Mental status - alert, oriented to person, place, and time Eyes - no conjunctival injection, no scleral icterus Mouth - mucous membranes moist, pharynx normal without lesions, mild erythema of OP, palate symmetric, uvula midline, no exudate Neck - supple, no significant adenopathy Chest - clear to auscultation, no wheezes, rales or rhonchi, symmetric air entry Heart - normal rate, regular rhythm, normal S1, S2, no murmurs, rubs, clicks or gallops Neuro- awake, alert Extremities -  peripheral pulses normal, no pedal edema, no clubbing or cyanosis Skin - normal coloration and turgor, no rashes  ED Course  Procedures (including critical care time) DIAGNOSTIC STUDIES: Oxygen Saturation is 99% on room air, normal by my interpretation.    COORDINATION OF CARE: 4:54 PM Discussed treatment plan with patient at beside, including salt water gargles, increased fluid intake, chloraseptic spray, and ibuprofen, the patient agrees with the plan and has no further questions at this time.   Labs Review Labs Reviewed  RAPID STREP SCREEN  CULTURE, GROUP A STREP    Imaging Review No results found.   EKG Interpretation None      MDM   Final diagnoses:  Viral pharyngitis   Pt presenting with 2 of her daughters for sore throat.  Rapid strep negative.  Pt without fever, no significant LAD.  Will await strep culture.  If this is negative pt most likely has viral pharyngitis.  No signs of PTA, low concern for retropharyngeal abscess.  Advised symptomatic care.  Discharged with strict return precautions.  Pt agreeable with plan.  I personally performed the services described in this documentation, which was scribed in my presence. The recorded information has been reviewed and is accurate.     Alfonzo Beers, MD 05/16/15 [id number]

## 2015-05-17 LAB — CULTURE, GROUP A STREP

## 2015-09-11 ENCOUNTER — Emergency Department (HOSPITAL_BASED_OUTPATIENT_CLINIC_OR_DEPARTMENT_OTHER)
Admission: EM | Admit: 2015-09-11 | Discharge: 2015-09-11 | Disposition: A | Discharge status: Home / Self Care | Payer: Medicare Other | Attending: Emergency Medicine | Admitting: Emergency Medicine

## 2015-09-11 ENCOUNTER — Encounter (HOSPITAL_BASED_OUTPATIENT_CLINIC_OR_DEPARTMENT_OTHER): Payer: Self-pay | Admitting: *Deleted

## 2015-09-11 DIAGNOSIS — J028 Acute pharyngitis due to other specified organisms: Secondary | ICD-10-CM

## 2015-09-11 DIAGNOSIS — Z79899 Other long term (current) drug therapy: Secondary | ICD-10-CM | POA: Diagnosis not present

## 2015-09-11 DIAGNOSIS — J029 Acute pharyngitis, unspecified: Secondary | ICD-10-CM | POA: Diagnosis not present

## 2015-09-11 DIAGNOSIS — I10 Essential (primary) hypertension: Secondary | ICD-10-CM | POA: Insufficient documentation

## 2015-09-11 DIAGNOSIS — J45909 Unspecified asthma, uncomplicated: Secondary | ICD-10-CM | POA: Diagnosis not present

## 2015-09-11 DIAGNOSIS — B9789 Other viral agents as the cause of diseases classified elsewhere: Secondary | ICD-10-CM

## 2015-09-11 LAB — RAPID STREP SCREEN (MED CTR MEBANE ONLY): STREPTOCOCCUS, GROUP A SCREEN (DIRECT): NEGATIVE

## 2015-09-11 MED ORDER — LIDOCAINE VISCOUS 2 % MT SOLN: 20.0000 mL | OROMUCOSAL | Status: DC | PRN

## 2015-09-11 NOTE — Discharge Instructions (Signed)
Pharyngitis Follow up with [facility]. Return for fever, difficulty breathing or swallowing. Pharyngitis is a sore throat (pharynx). There is redness, pain, and swelling of your throat. HOME CARE   Drink enough fluids to keep your pee (urine) clear or pale yellow.  Only take medicine as told by your doctor.  You may get sick again if you do not take medicine as told. Finish your medicines, even if you start to feel better.  Do not take aspirin.  Rest.  Rinse your mouth (gargle) with salt water ( tsp of salt per 1 qt of water) every 1-2 hours. This will help the pain.  If you are not at risk for choking, you can suck on hard candy or sore throat lozenges. GET HELP IF:  You have large, tender lumps on your neck.  You have a rash.  You cough up green, yellow-brown, or bloody spit. GET HELP RIGHT AWAY IF:   You have a stiff neck.  You drool or cannot swallow liquids.  You throw up (vomit) or are not able to keep medicine or liquids down.  You have very bad pain that does not go away with medicine.  You have problems breathing (not from a stuffy nose). MAKE SURE YOU:   Understand these instructions.  Will watch your condition.  Will get help right away if you are not doing well or get worse. Document Released: 06/03/2008 Document Revised: 10/06/2013 Document Reviewed: 08/23/2013 [facility] Patient Information 2015 [place], [place]. This information is not intended to replace advice given to you by your health care provider. Make sure you discuss any questions you have with your health care provider.

## 2015-09-11 NOTE — ED Notes (Signed)
Sore throat since yesterday.

## 2015-09-11 NOTE — ED Provider Notes (Signed)
CSN: [id number]     Arrival date & time 09/11/15  1216 History   First MD Initiated Contact with Patient 09/11/15 1229     Chief Complaint  Patient presents with  . Sore Throat     (Consider location/radiation/quality/duration/timing/severity/associated sxs/prior Treatment) Patient is a 39 y.o. female presenting with pharyngitis. The history is provided by the patient. No language interpreter was used.  Sore Throat Associated symptoms include a sore throat. Pertinent negatives include no chest pain, fever, nausea or vomiting.   Miss Tesfaye is a 39 year old female with a history of asthma and hypertension who presents for worsening and constant sore throat since yesterday. She has taken Tylenol and ibuprofen with minimal relief. She also saying in the choir yesterday and states that it aggravated the sore throat. She reports having a nonproductive cough. Worse with swallowing. She denies any fever, chills, chest pain, shortness of breath, nausea or vomiting.  Past Medical History  Diagnosis Date  . Asthma   . Hypertension    Past Surgical History  Procedure Laterality Date  . Leg surgery     No family history on file. Social History  Substance Use Topics  . Smoking status: Never Smoker   . Smokeless tobacco: None  . Alcohol Use: No   OB History    No data available     Review of Systems  Constitutional: Negative for fever.  HENT: Positive for sore throat.   Respiratory: Negative for shortness of breath.   Cardiovascular: Negative for chest pain.  Gastrointestinal: Negative for nausea and vomiting.      Allergies  Review of patient's allergies indicates no known allergies.  Home Medications   Prior to Admission medications   Medication Sig Start Date End Date Taking? Authorizing Provider  cyclobenzaprine (FLEXERIL) 10 MG tablet Take 1 tablet (10 mg total) by mouth 2 (two) times daily as needed for muscle spasms. 01/18/15   Tiffany Carlota Raspberry, PA-C  hydrochlorothiazide  (HYDRODIURIL) 25 MG tablet Take 25 mg by mouth daily.    Historical Provider, MD  HYDROcodone-acetaminophen (NORCO/VICODIN) 5-325 MG per tablet Take 1-2 tablets by mouth every 4 (four) hours as needed. 01/29/14   Charlann Lange, PA-C  Hydrocodone-APAP-Dietary Prod (HYDROCODONE-APAP-NUTRIT SUPP) 10-650 MG MISC Take 1 tablet by mouth daily as needed. Patient takes this medication for pain.    Historical Provider, MD  ibuprofen (ADVIL,MOTRIN) 800 MG tablet Take 800 mg by mouth every 8 (eight) hours as needed. Patient used this medication for headaches and leg pain.    Historical Provider, MD  lidocaine (XYLOCAINE) 2 % solution Use as directed 20 mLs in the mouth or throat as needed for mouth pain. 09/11/15   Paytin Ramakrishnan Patel-Mills, PA-C  methocarbamol (ROBAXIN) 500 MG tablet Take 1 tablet (500 mg total) by mouth 2 (two) times daily. 11/30/14   Hannah Muthersbaugh, PA-C  metoprolol tartrate (LOPRESSOR) 25 MG tablet Take 25 mg by mouth 2 (two) times daily.    Historical Provider, MD  penicillin v potassium (VEETID) 500 MG tablet Take 1 tablet (500 mg total) by mouth 3 (three) times daily. 01/29/14   Charlann Lange, PA-C  traMADol (ULTRAM) 50 MG tablet Take 1 tablet (50 mg total) by mouth every 6 (six) hours as needed. 01/18/15   Tiffany Carlota Raspberry, PA-C   BP 124/71 mmHg  Pulse 84  Temp(Src) 98.6 F (37 C) (Oral)  Resp 20  Ht 5\' 10"  (1.778 m)  Wt 220 lb (99.791 kg)  BMI 31.57 kg/m2  SpO2 100%  LMP 08/31/2015 Physical  Exam  Constitutional: She is oriented to person, place, and time. She appears well-developed and well-nourished.  HENT:  Head: Normocephalic and atraumatic.  Mouth/Throat: Uvula is midline and mucous membranes are normal. No uvula swelling. Posterior oropharyngeal erythema present. No oropharyngeal exudate, posterior oropharyngeal edema or tonsillar abscesses.  Post-oropharyngeal erythema. Patent airway. No drooling or trismus. No uvula swelling. No tonsillar swelling or drainage. No exudates. No hot  potato voice or palate swelling.  No anterior cervical lymphadenopathy but there is tenderness to palpation bilaterally.  Eyes: Conjunctivae are normal.  Neck: Normal range of motion. Neck supple.  Cardiovascular: Normal rate, regular rhythm and normal heart sounds.   Pulmonary/Chest: Effort normal and breath sounds normal. No respiratory distress. She has no wheezes. She has no rales.  Lungs clear to auscultation bilaterally. No wheezing or decreased breath sounds.  Abdominal: Soft. There is no tenderness.  Musculoskeletal: Normal range of motion.  Neurological: She is alert and oriented to person, place, and time.  Skin: Skin is warm and dry.  Nursing note and vitals reviewed.   ED Course  Procedures (including critical care time) Labs Review Labs Reviewed  RAPID STREP SCREEN (NOT AT [facility])  CULTURE, GROUP A STREP    Imaging Review No results found. I have personally reviewed and evaluated these lab results as part of my medical decision-making.   EKG Interpretation None      MDM   Final diagnoses:  Acute viral pharyngitis  Patient presents for sore throat. She is well appearing and afebrile. Strep is negative. I reviewed the [facility] criteria. I do not believe the patient needs anabiotic's at this time. Medications - No data to display  I explained that this is most likely viral pharyngitis and that she would not need anabiotic's this time. I discussed return precautions and follow-up. She verbally agrees with the plan. Rx: Viscous lidocaine   Ottie Glazier, PA-C 09/11/15 [place], MD 09/16/15 [phone]

## 2015-09-13 LAB — CULTURE, GROUP A STREP: STREP A CULTURE: NEGATIVE

## 2015-09-22 ENCOUNTER — Encounter (HOSPITAL_BASED_OUTPATIENT_CLINIC_OR_DEPARTMENT_OTHER): Payer: Self-pay | Admitting: *Deleted

## 2015-09-22 ENCOUNTER — Emergency Department (HOSPITAL_BASED_OUTPATIENT_CLINIC_OR_DEPARTMENT_OTHER)
Admission: EM | Admit: 2015-09-22 | Discharge: 2015-09-23 | Disposition: A | Discharge status: Home / Self Care | Payer: Medicare Other | Attending: Emergency Medicine | Admitting: Emergency Medicine

## 2015-09-22 DIAGNOSIS — Z79899 Other long term (current) drug therapy: Secondary | ICD-10-CM | POA: Insufficient documentation

## 2015-09-22 DIAGNOSIS — J45909 Unspecified asthma, uncomplicated: Secondary | ICD-10-CM | POA: Diagnosis not present

## 2015-09-22 DIAGNOSIS — Z792 Long term (current) use of antibiotics: Secondary | ICD-10-CM | POA: Insufficient documentation

## 2015-09-22 DIAGNOSIS — N939 Abnormal uterine and vaginal bleeding, unspecified: Secondary | ICD-10-CM

## 2015-09-22 DIAGNOSIS — Z3202 Encounter for pregnancy test, result negative: Secondary | ICD-10-CM | POA: Insufficient documentation

## 2015-09-22 DIAGNOSIS — I10 Essential (primary) hypertension: Secondary | ICD-10-CM | POA: Diagnosis not present

## 2015-09-22 LAB — CBC WITH DIFFERENTIAL/PLATELET
BASOS ABS: 0 10*3/uL (ref 0.0–0.1)
BASOS PCT: 1 %
EOS ABS: 0.2 10*3/uL (ref 0.0–0.7)
Eosinophils Relative: 5 %
HCT: 30.4 % — ABNORMAL LOW (ref 36.0–46.0)
HEMOGLOBIN: 9.2 g/dL — AB (ref 12.0–15.0)
Lymphocytes Relative: 48 %
Lymphs Abs: 1.9 10*3/uL (ref 0.7–4.0)
MCH: 22.8 pg — ABNORMAL LOW (ref 26.0–34.0)
MCHC: 30.3 g/dL (ref 30.0–36.0)
MCV: 75.2 fL — ABNORMAL LOW (ref 78.0–100.0)
MONOS PCT: 11 %
Monocytes Absolute: 0.4 10*3/uL (ref 0.1–1.0)
NEUTROS PCT: 37 %
Neutro Abs: 1.5 10*3/uL — ABNORMAL LOW (ref 1.7–7.7)
Platelets: 375 10*3/uL (ref 150–400)
RBC: 4.04 MIL/uL (ref 3.87–5.11)
RDW: 17.2 % — ABNORMAL HIGH (ref 11.5–15.5)
WBC: 4.1 10*3/uL (ref 4.0–10.5)

## 2015-09-22 LAB — PREGNANCY, URINE: PREG TEST UR: NEGATIVE

## 2015-09-22 NOTE — ED Notes (Signed)
MD at bedside. 

## 2015-09-22 NOTE — ED Provider Notes (Signed)
CSN: [id number]     Arrival date & time 09/22/15  2217 History  This chart was scribe for Shanon Rosser, MD by Judithann Sauger, ED Scribe. The patient was seen in room MH05/MH05 and the patient's care was started at 11:10 PM.    Chief Complaint  Patient presents with  . Vaginal Bleeding   The history is provided by the patient. No language interpreter was used.   HPI Comments: Kristen Swanson is a 39 y.o. female who presents to the Emergency Department complaining of vaginal bleeding. Pt reports that her last menstrual cycle started on 09/01/15, which was heavy for 6 days with passage of clots. It abated but then resumed on 09/19/15. She reports associated fatigue, weakness, SOB, lightheadedness on standing, and an overall lack of energy. She states that her PCP prescribed her some hormones yesterday to take for 5 days and is scheduled for an ultrasound next week. She states that she is here today because she is still bleeding. She had to be given blood transfusion several years ago when her hemoglobin got down to 6. She states her symptoms are similar to how she felt then.  PCP: Dr. York Ram.  No OBGYN  Past Medical History  Diagnosis Date  . Asthma   . Hypertension    Past Surgical History  Procedure Laterality Date  . Leg surgery     No family history on file. Social History  Substance Use Topics  . Smoking status: Never Smoker   . Smokeless tobacco: None  . Alcohol Use: No   OB History    No data available     Review of Systems  All other systems reviewed and are negative.  A complete 10 system review of systems was obtained and all systems are negative except as noted in the HPI and PMH.     Allergies  Review of patient's allergies indicates no known allergies.  Home Medications   Prior to Admission medications   Medication Sig Start Date End Date Taking? Authorizing Provider  cyclobenzaprine (FLEXERIL) 10 MG tablet Take 1 tablet (10 mg total) by mouth 2 (two)  times daily as needed for muscle spasms. 01/18/15   Tiffany Carlota Raspberry, PA-C  hydrochlorothiazide (HYDRODIURIL) 25 MG tablet Take 25 mg by mouth daily.    Historical Provider, MD  HYDROcodone-acetaminophen (NORCO/VICODIN) 5-325 MG per tablet Take 1-2 tablets by mouth every 4 (four) hours as needed. 01/29/14   Charlann Lange, PA-C  Hydrocodone-APAP-Dietary Prod (HYDROCODONE-APAP-NUTRIT SUPP) 10-650 MG MISC Take 1 tablet by mouth daily as needed. Patient takes this medication for pain.    Historical Provider, MD  ibuprofen (ADVIL,MOTRIN) 800 MG tablet Take 800 mg by mouth every 8 (eight) hours as needed. Patient used this medication for headaches and leg pain.    Historical Provider, MD  lidocaine (XYLOCAINE) 2 % solution Use as directed 20 mLs in the mouth or throat as needed for mouth pain. 09/11/15   Hanna Patel-Mills, PA-C  methocarbamol (ROBAXIN) 500 MG tablet Take 1 tablet (500 mg total) by mouth 2 (two) times daily. 11/30/14   Hannah Muthersbaugh, PA-C  metoprolol tartrate (LOPRESSOR) 25 MG tablet Take 25 mg by mouth 2 (two) times daily.    Historical Provider, MD  penicillin v potassium (VEETID) 500 MG tablet Take 1 tablet (500 mg total) by mouth 3 (three) times daily. 01/29/14   Charlann Lange, PA-C  traMADol (ULTRAM) 50 MG tablet Take 1 tablet (50 mg total) by mouth every 6 (six) hours as needed. 01/18/15  Tiffany Carlota Raspberry, PA-C   BP 142/82 mmHg  Pulse 87  Temp(Src) 98.4 F (36.9 C) (Oral)  Resp 20  Ht 5\' 10"  (1.778 m)  Wt 230 lb (104.327 kg)  BMI 33.00 kg/m2  SpO2 100%  LMP 08/31/2015   Physical Exam  Nursing note and vitals reviewed. General: Well-developed, well-nourished female in no acute distress; appearance consistent with age of record HENT: normocephalic; atraumatic Eyes: pupils equal, round and reactive to light; extraocular muscles intact Neck: supple Heart: regular rate and rhythm; no murmurs, rubs or gallops Lungs: clear to auscultation bilaterally Abdomen: soft; nondistended;  nontender; no masses or hepatosplenomegaly; bowel sounds present GU: Normal external genitalia; slight vaginal bleeding; no vaginal discharge; no cervical motion tenderness; no adnexal tenderness Extremities: No deformity; full range of motion; pulses normal Neurologic: Awake, alert and oriented; motor function intact in all extremities and symmetric; no facial droop Skin: Warm and dry Psychiatric: Normal mood and affect    ED Course  Procedures (including critical care time) DIAGNOSTIC STUDIES: Oxygen Saturation is 100% on RA, normal by my interpretation.    COORDINATION OF CARE: 11:15 PM- Pt advised of plan for treatment and pt agrees.      MDM   Nursing notes and vitals signs, including pulse oximetry, reviewed.  Summary of this visit's results, reviewed by myself:  Labs:  Results for orders placed or performed during the hospital encounter of 09/22/15 (from the past 24 hour(s))  Pregnancy, urine     Status: None   Collection Time: 09/22/15 11:22 PM  Result Value Ref Range   Preg Test, Ur NEGATIVE NEGATIVE  CBC with Differential     Status: Abnormal   Collection Time: 09/22/15 11:40 PM  Result Value Ref Range   WBC 4.1 4.0 - 10.5 K/uL   RBC 4.04 3.87 - 5.11 MIL/uL   Hemoglobin 9.2 (L) 12.0 - 15.0 g/dL   HCT 30.4 (L) 36.0 - 46.0 %   MCV 75.2 (L) 78.0 - 100.0 fL   MCH 22.8 (L) 26.0 - 34.0 pg   MCHC 30.3 30.0 - 36.0 g/dL   RDW 17.2 (H) 11.5 - 15.5 %   Platelets 375 150 - 400 K/uL   Neutrophils Relative % 37 %   Neutro Abs 1.5 (L) 1.7 - 7.7 K/uL   Lymphocytes Relative 48 %   Lymphs Abs 1.9 0.7 - 4.0 K/uL   Monocytes Relative 11 %   Monocytes Absolute 0.4 0.1 - 1.0 K/uL   Eosinophils Relative 5 %   Eosinophils Absolute 0.2 0.0 - 0.7 K/uL   Basophils Relative 1 %   Basophils Absolute 0.0 0.0 - 0.1 K/uL   We will refer to [facility] as the patient does not have a regular OB/GYN.  I personally performed the services described in this documentation, which was  scribed in my presence. The recorded information has been reviewed and is accurate.   Shanon Rosser, MD 09/23/15 [id number]

## 2015-09-22 NOTE — ED Notes (Signed)
Vaginal bleeding. She was seen by her MD yesterday for same. She was scheduled for an [place] for next week and given a Rx for hormones to take 5 days. Bleeding did not stop today.

## 2015-09-23 DIAGNOSIS — N939 Abnormal uterine and vaginal bleeding, unspecified: Secondary | ICD-10-CM | POA: Diagnosis not present

## 2015-09-23 MED ORDER — SODIUM CHLORIDE 0.9 % IV BOLUS (SEPSIS)
1000.0000 mL | Freq: Once | INTRAVENOUS | Status: AC
  Administered 2015-09-23: 1000 mL via INTRAVENOUS

## 2015-09-23 NOTE — Discharge Instructions (Signed)
Abnormal Uterine Bleeding Abnormal uterine bleeding can affect women at various stages in life, including teenagers, women in their reproductive years, pregnant women, and women who have reached menopause. Several kinds of uterine bleeding are considered abnormal, including:  Bleeding or spotting between periods.   Bleeding after sexual intercourse.   Bleeding that is heavier or more than normal.   Periods that last longer than usual.  Bleeding after menopause.  Many cases of abnormal uterine bleeding are minor and simple to treat, while others are more serious. Any type of abnormal bleeding should be evaluated by your health care provider. Treatment will depend on the cause of the bleeding. HOME CARE INSTRUCTIONS Monitor your condition for any changes. The following actions may help to alleviate any discomfort you are experiencing:  Avoid the use of tampons and douches as directed by your health care provider.  Change your pads frequently. You should get regular pelvic exams and Pap tests. Keep all follow-up appointments for diagnostic tests as directed by your health care provider.  SEEK MEDICAL CARE IF:   Your bleeding lasts more than 1 week.   You feel dizzy at times.  SEEK IMMEDIATE MEDICAL CARE IF:   You pass out.   You are changing pads every 15 to 30 minutes.   You have abdominal pain.  You have a fever.   You become sweaty or weak.   You are passing large blood clots from the vagina.   You start to feel nauseous and vomit. MAKE SURE YOU:   Understand these instructions.  Will watch your condition.  Will get help right away if you are not doing well or get worse. Document Released: 12/16/2005 Document Revised: 12/21/2013 Document Reviewed: 07/15/2013 Exit[employer] Patient Information 2015 [employer]. This information is not intended to replace advice given to you by your health care provider. Make sure you discuss any questions you have with your  health care provider.  

## 2015-09-25 LAB — GC/CHLAMYDIA PROBE AMP (~~LOC~~) NOT AT ARMC
Chlamydia: NEGATIVE
Neisseria Gonorrhea: NEGATIVE

## 2015-10-26 ENCOUNTER — Encounter (HOSPITAL_BASED_OUTPATIENT_CLINIC_OR_DEPARTMENT_OTHER): Payer: Self-pay

## 2015-10-26 ENCOUNTER — Emergency Department (HOSPITAL_BASED_OUTPATIENT_CLINIC_OR_DEPARTMENT_OTHER)
Admission: EM | Admit: 2015-10-26 | Discharge: 2015-10-26 | Disposition: A | Discharge status: Home / Self Care | Payer: Medicare Other | Attending: Emergency Medicine | Admitting: Emergency Medicine

## 2015-10-26 DIAGNOSIS — I10 Essential (primary) hypertension: Secondary | ICD-10-CM | POA: Diagnosis not present

## 2015-10-26 DIAGNOSIS — R202 Paresthesia of skin: Secondary | ICD-10-CM | POA: Diagnosis not present

## 2015-10-26 DIAGNOSIS — M62838 Other muscle spasm: Secondary | ICD-10-CM

## 2015-10-26 DIAGNOSIS — F909 Attention-deficit hyperactivity disorder, unspecified type: Secondary | ICD-10-CM | POA: Diagnosis not present

## 2015-10-26 DIAGNOSIS — J45909 Unspecified asthma, uncomplicated: Secondary | ICD-10-CM | POA: Insufficient documentation

## 2015-10-26 DIAGNOSIS — M79632 Pain in left forearm: Secondary | ICD-10-CM | POA: Insufficient documentation

## 2015-10-26 DIAGNOSIS — Z79899 Other long term (current) drug therapy: Secondary | ICD-10-CM | POA: Insufficient documentation

## 2015-10-26 DIAGNOSIS — R200 Anesthesia of skin: Secondary | ICD-10-CM | POA: Diagnosis present

## 2015-10-26 HISTORY — DX: Attention-deficit hyperactivity disorder, unspecified type: F90.9

## 2015-10-26 MED ORDER — CYCLOBENZAPRINE HCL 5 MG PO TABS: 5.0000 mg | ORAL_TABLET | Freq: Three times a day (TID) | ORAL | Status: DC | PRN

## 2015-10-26 NOTE — ED Notes (Signed)
EMT at bedside to obtain ekg

## 2015-10-26 NOTE — Discharge Instructions (Signed)

## 2015-10-26 NOTE — ED Provider Notes (Signed)
CSN: [id number]     Arrival date & time 10/26/15  1155 History   First MD Initiated Contact with Patient 10/26/15 1220     Chief Complaint  Patient presents with  . Numbness     (Consider location/radiation/quality/duration/timing/severity/associated sxs/prior Treatment) Patient is a 39 y.o. female presenting with extremity pain.  Extremity Pain This is a new problem. The current episode started yesterday. The problem occurs constantly. The problem has not changed since onset.Pertinent negatives include no chest pain and no abdominal pain. Associated symptoms comments: Left arm numbness and tingling. No leg numbness or weakness.. Nothing aggravates the symptoms. Nothing relieves the symptoms. She has tried nothing for the symptoms.    Past Medical History  Diagnosis Date  . Asthma   . Hypertension   . ADHD (attention deficit hyperactivity disorder)    Past Surgical History  Procedure Laterality Date  . Leg surgery     No family history on file. Social History  Substance Use Topics  . Smoking status: Never Smoker   . Smokeless tobacco: None  . Alcohol Use: No   OB History    No data available     Review of Systems  Cardiovascular: Negative for chest pain.  Gastrointestinal: Negative for abdominal pain.  All other systems reviewed and are negative.     Allergies  Review of patient's allergies indicates no known allergies.  Home Medications   Prior to Admission medications   Medication Sig Start Date End Date Taking? Authorizing Provider  amphetamine-dextroamphetamine (ADDERALL XR) 20 MG 24 hr capsule Take 20 mg by mouth daily.   Yes Historical Provider, MD  hydrochlorothiazide (HYDRODIURIL) 25 MG tablet Take 25 mg by mouth daily.    Historical Provider, MD  HYDROcodone-acetaminophen (NORCO/VICODIN) 5-325 MG per tablet Take 1-2 tablets by mouth every 4 (four) hours as needed. 01/29/14   Charlann Lange, PA-C  Hydrocodone-APAP-Dietary Prod (HYDROCODONE-APAP-NUTRIT  SUPP) 10-650 MG MISC Take 1 tablet by mouth daily as needed. Patient takes this medication for pain.    Historical Provider, MD  ibuprofen (ADVIL,MOTRIN) 800 MG tablet Take 800 mg by mouth every 8 (eight) hours as needed. Patient used this medication for headaches and leg pain.    Historical Provider, MD  metoprolol tartrate (LOPRESSOR) 25 MG tablet Take 25 mg by mouth 2 (two) times daily.    Historical Provider, MD   BP 122/82 mmHg  Pulse 78  Temp(Src) 98.2 F (36.8 C) (Oral)  Resp 20  Ht 5\' 10"  (1.778 m)  Wt 220 lb (99.791 kg)  BMI 31.57 kg/m2  SpO2 100%  LMP 10/06/2015 Physical Exam  Constitutional: She is oriented to person, place, and time. She appears well-developed and well-nourished. No distress.  HENT:  Head: Normocephalic and atraumatic.  Mouth/Throat: Oropharynx is clear and moist.  Eyes: Conjunctivae are normal. Pupils are equal, round, and reactive to light. No scleral icterus.  Neck: Neck supple.  Cardiovascular: Normal rate, regular rhythm, normal heart sounds and intact distal pulses.   No murmur heard. Pulmonary/Chest: Effort normal and breath sounds normal. No stridor. No respiratory distress. She has no wheezes. She has no rales.  Abdominal: Soft. Bowel sounds are normal. She exhibits no distension. There is no tenderness.  Musculoskeletal: Normal range of motion.  Left arm: Tenderness to palpation and muscle spasm left trapezius. Also tenderness to left forearm. No erythema. Distal pulses intact. Mild grip weakness on both sides, normal elbow flexion and extension strength both sides. Normal sensation.  Neurological: She is alert and oriented to  person, place, and time.  Skin: Skin is warm and dry. No rash noted.  Psychiatric: She has a normal mood and affect. Her behavior is normal.  Nursing note and vitals reviewed.   ED Course  Procedures (including critical care time) Labs Review Labs Reviewed - No data to display  Imaging Review No results found. I  have personally reviewed and evaluated these images and lab results as part of my medical decision-making.   EKG Interpretation   Date/Time:  Thursday October 26 2015 12:12:43 EDT Ventricular Rate:  85 PR Interval:  160 QRS Duration: 84 QT Interval:  376 QTC Calculation: 447 R Axis:   5 Text Interpretation:  Normal sinus rhythm Cannot rule out Anterior infarct  , age undetermined Abnormal ECG similar to prior. Confirmed by [facility]   MD, TREY ([id number]) on 10/26/2015 12:40:38 PM      MDM   Final diagnoses:  Muscle spasm of left shoulder    39 year old female presenting with left shoulder and arm pain with associated tingling and numbness. She describes the numbness and tingling is from her elbow to her wrist, not her hand.  She also has tightness and muscle spasm of her left trapezius. No cervical spine tenderness or pain.  Her numbness and tingling are in an atypical distribution. I suspect that her symptoms are related to her trapezius spasm. She does report that she was at the dentist the other day and was very tense during a procedure.  She does not have evidence of ACS. I do not think her symptoms represent CVA. We discussed muscle relaxers, NSAIDs, and outpatient follow-up if symptoms persisted. She was given return precautions.    Serita Grit, MD 10/26/15 1321

## 2015-10-26 NOTE — ED Notes (Signed)
MD at bedside. 

## 2015-10-26 NOTE — ED Notes (Signed)
C/o numbness and tingling to left arm and SOB-both started last night-pt NAD-denies pain

## 2015-11-29 ENCOUNTER — Encounter: Payer: Self-pay | Admitting: *Deleted

## 2016-01-01 ENCOUNTER — Ambulatory Visit (INDEPENDENT_AMBULATORY_CARE_PROVIDER_SITE_OTHER): Payer: Medicare Other | Admitting: Obstetrics & Gynecology

## 2016-01-01 ENCOUNTER — Encounter: Payer: Self-pay | Admitting: Obstetrics & Gynecology

## 2016-01-01 VITALS — BP 121/74 | HR 87 | Temp 99.2°F | Ht 70.0 in | Wt 250.7 lb

## 2016-01-01 DIAGNOSIS — N920 Excessive and frequent menstruation with regular cycle: Secondary | ICD-10-CM | POA: Diagnosis not present

## 2016-01-01 DIAGNOSIS — D259 Leiomyoma of uterus, unspecified: Secondary | ICD-10-CM | POA: Diagnosis present

## 2016-01-01 LAB — POCT PREGNANCY, URINE: Preg Test, Ur: NEGATIVE

## 2016-01-01 NOTE — Progress Notes (Signed)
Patient ID: Kristen Swanson, female   DOB: 1976-06-12, 40 y.o.   MRN: [id number]  Chief Complaint  Patient presents with  . Menstrual Problem  heavy period and fibroids on [place]  HPI Kristen Swanson is a 40 y.o. female.  [id number] Patient's last menstrual period was 12/08/2015. Menses are heavy and she has been transfused in the past for anemia. Referred by Dr Jimmye Norman in [place]. US done at [facility] in [place], result reviewed. HPI  Past Medical History  Diagnosis Date  . Asthma   . Hypertension   . ADHD (attention deficit hyperactivity disorder)     Past Surgical History  Procedure Laterality Date  . Leg surgery      History reviewed. No pertinent family history.  Social History Social History  Substance Use Topics  . Smoking status: Never Smoker   . Smokeless tobacco: Never Used  . Alcohol Use: No    No Known Allergies  Current Outpatient Prescriptions  Medication Sig Dispense Refill  . amphetamine-dextroamphetamine (ADDERALL XR) 20 MG 24 hr capsule Take 20 mg by mouth daily.    . cyclobenzaprine (FLEXERIL) 5 MG tablet Take 1 tablet (5 mg total) by mouth 3 (three) times daily as needed for muscle spasms. 10 tablet 0  . hydrochlorothiazide (HYDRODIURIL) 25 MG tablet Take 25 mg by mouth daily.    Marland Kitchen HYDROcodone-acetaminophen (NORCO/VICODIN) 5-325 MG per tablet Take 1-2 tablets by mouth every 4 (four) hours as needed. 12 tablet 0  . Hydrocodone-APAP-Dietary Prod (HYDROCODONE-APAP-NUTRIT SUPP) 10-650 MG MISC Take 1 tablet by mouth daily as needed. Patient takes this medication for pain.    Marland Kitchen ibuprofen (ADVIL,MOTRIN) 800 MG tablet Take 800 mg by mouth every 8 (eight) hours as needed. Patient used this medication for headaches and leg pain.    . valACYclovir (VALTREX) 500 MG tablet Take 500 mg by mouth daily.  0  . zolpidem (AMBIEN) 10 MG tablet     . metoprolol tartrate (LOPRESSOR) 25 MG tablet Take 25 mg by mouth 2 (two) times daily. Reported on 01/01/2016     No current  facility-administered medications for this visit.    Review of Systems Review of Systems  Constitutional: Negative.   Cardiovascular: Negative.   Gastrointestinal: Negative.   Genitourinary: Positive for menstrual problem. Negative for vaginal bleeding, vaginal discharge and pelvic pain.    Blood pressure 121/74, pulse 87, temperature 99.2 F (37.3 C), height 5\' 10"  (1.778 m), weight 250 lb 11.2 oz (113.717 kg), last menstrual period 12/08/2015.  Physical Exam Physical Exam  Constitutional: She is oriented to person, place, and time. She appears well-developed. No distress.  Abdominal: Soft.  Genitourinary: Vagina normal and uterus normal. No vaginal discharge found.  Uterus deviated to right, cervix on right no lesions  Neurological: She is alert and oriented to person, place, and time.  Skin: Skin is warm and dry. No pallor.  Psychiatric: She has a normal mood and affect. Her behavior is normal.    Data Reviewed [place] 09/28/15 [facility] INDICATION: Dysfunctional uterine bleeding. N93.8  TECHNIQUE: Transabdominal and transvaginal pelvic ultrasound.  FINDINGS: Uterus is retroverted measuring 9.6 x 6 x 5.6 cm. At least 4 uterine fibroids measuring 3.1 cm, 1.1 cm, 1.3 cm, and 2 cm in greatest dimension. The 1.3 cm fibroid appear subendometrial. 7 mm endometrial stripe. Normal right ovary measures 3.4 x  1.8 x 2.3 cm. Normal left ovary measures 3.6 x 2.1 x 1.5 cm. Ovarian flow is documented by duplex color with spectral analysis. Small amount of  free fluid in the cul-de-sac and right adnexa.   IMPRESSION: Uterine fibroids. 7 mm endometrial stripe.  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

## 2016-01-01 NOTE — Patient Instructions (Signed)
Uterine Fibroids Uterine fibroids are tissue masses (tumors) that can develop in the womb (uterus). They are also called leiomyomas. This type of tumor is not cancerous (benign) and does not spread to other parts of the body outside of the pelvic area, which is between the hip bones. Occasionally, fibroids may develop in the fallopian tubes, in the cervix, or on the support structures (ligaments) that surround the uterus. You can have one or many fibroids. Fibroids can vary in size, weight, and where they grow in the uterus. Some can become quite large. Most fibroids do not require medical treatment. CAUSES A fibroid can develop when a single uterine cell keeps growing (replicating). Most cells in the human body have a control mechanism that keeps them from replicating without control. SIGNS AND SYMPTOMS Symptoms may include:   Heavy bleeding during your period.  Bleeding or spotting between periods.  Pelvic pain and pressure.  Bladder problems, such as needing to urinate more often (urinary frequency) or urgently.  Inability to reproduce offspring (infertility).  Miscarriages. DIAGNOSIS Uterine fibroids are diagnosed through a physical exam. Your health care provider may feel the lumpy tumors during a pelvic exam. Ultrasonography and an MRI may be done to determine the size, location, and number of fibroids. TREATMENT Treatment may include:  Watchful waiting. This involves getting the fibroid checked by your health care provider to see if it grows or shrinks. Follow your health care provider's recommendations for how often to have this checked.  Hormone medicines. These can be taken by mouth or given through an intrauterine device (IUD).  Surgery.  Removing the fibroids (myomectomy) or the uterus (hysterectomy).  Removing blood supply to the fibroids (uterine artery embolization). If fibroids interfere with your fertility and you want to become pregnant, your health care provider  may recommend having the fibroids removed.  HOME CARE INSTRUCTIONS  Keep all follow-up visits as directed by your health care provider. This is important.  Take medicines only as directed by your health care provider.  If you were prescribed a hormone treatment, take the hormone medicines exactly as directed.  Do not take aspirin, because it can cause bleeding.  Ask your health care provider about taking iron pills and increasing the amount of dark green, leafy vegetables in your diet. These actions can help to boost your blood iron levels, which may be affected by heavy menstrual bleeding.  Pay close attention to your period and tell your health care provider about any changes, such as:  Increased blood flow that requires you to use more pads or tampons than usual per month.  A change in the number of days that your period lasts per month.  A change in symptoms that are associated with your period, such as abdominal cramping or back pain. SEEK MEDICAL CARE IF:  You have pelvic pain, back pain, or abdominal cramps that cannot be controlled with medicines.  You have an increase in bleeding between and during periods.  You soak tampons or pads in a half hour or less.  You feel lightheaded, extra tired, or weak. SEEK IMMEDIATE MEDICAL CARE IF:  You faint.  You have a sudden increase in pelvic pain.   This information is not intended to replace advice given to you by your health care provider. Make sure you discuss any questions you have with your health care provider.   Document Released: 12/13/2000 Document Revised: 01/06/2015 Document Reviewed: 06/14/2014 Elsevier Interactive Patient Education 2016 [employer]  

## 2016-01-16 ENCOUNTER — Other Ambulatory Visit: Payer: Self-pay | Admitting: Obstetrics & Gynecology

## 2016-01-16 ENCOUNTER — Ambulatory Visit (HOSPITAL_COMMUNITY)
Admission: RE | Admit: 2016-01-16 | Discharge: 2016-01-16 | Disposition: A | Discharge status: Home / Self Care | Payer: Medicaid Other | Source: Ambulatory Visit | Attending: Obstetrics & Gynecology | Admitting: Obstetrics & Gynecology

## 2016-01-16 DIAGNOSIS — N920 Excessive and frequent menstruation with regular cycle: Secondary | ICD-10-CM

## 2016-01-16 DIAGNOSIS — D259 Leiomyoma of uterus, unspecified: Secondary | ICD-10-CM

## 2016-01-22 DIAGNOSIS — F5102 Adjustment insomnia: Secondary | ICD-10-CM | POA: Diagnosis not present

## 2016-01-22 DIAGNOSIS — K5909 Other constipation: Secondary | ICD-10-CM | POA: Diagnosis not present

## 2016-01-22 DIAGNOSIS — R5382 Chronic fatigue, unspecified: Secondary | ICD-10-CM | POA: Diagnosis not present

## 2016-01-22 DIAGNOSIS — G894 Chronic pain syndrome: Secondary | ICD-10-CM | POA: Diagnosis not present

## 2016-02-09 ENCOUNTER — Ambulatory Visit (HOSPITAL_COMMUNITY): Admission: RE | Admit: 2016-02-09 | Payer: Medicare Other | Source: Ambulatory Visit

## 2016-02-15 ENCOUNTER — Ambulatory Visit (HOSPITAL_COMMUNITY)
Admission: RE | Admit: 2016-02-15 | Discharge: 2016-02-15 | Disposition: A | Discharge status: Home / Self Care | Payer: Medicare Other | Source: Ambulatory Visit | Attending: Obstetrics & Gynecology | Admitting: Obstetrics & Gynecology

## 2016-02-15 DIAGNOSIS — D259 Leiomyoma of uterus, unspecified: Secondary | ICD-10-CM | POA: Insufficient documentation

## 2016-02-15 DIAGNOSIS — D649 Anemia, unspecified: Secondary | ICD-10-CM | POA: Insufficient documentation

## 2016-02-15 DIAGNOSIS — N939 Abnormal uterine and vaginal bleeding, unspecified: Secondary | ICD-10-CM | POA: Insufficient documentation

## 2016-05-22 ENCOUNTER — Telehealth: Payer: Self-pay | Admitting: *Deleted

## 2016-05-22 DIAGNOSIS — D250 Submucous leiomyoma of uterus: Secondary | ICD-10-CM

## 2016-05-22 MED ORDER — TRAMADOL HCL 50 MG PO TABS: 50.0000 mg | ORAL_TABLET | Freq: Four times a day (QID) | ORAL | Status: DC | PRN

## 2016-05-23 NOTE — Telephone Encounter (Signed)
Pt in clinic c/o pain. Rx given via verbal order for Tramadol by Dr. Roselie Awkward. Pt to followup in clinic for further management of fibroids. Manuela Schwartz to schedule appt.

## 2016-06-10 ENCOUNTER — Ambulatory Visit: Payer: Medicaid Other | Admitting: Obstetrics & Gynecology

## 2016-10-07 ENCOUNTER — Ambulatory Visit: Payer: Medicare Other | Admitting: Obstetrics & Gynecology

## 2016-10-07 ENCOUNTER — Encounter: Payer: Self-pay | Admitting: *Deleted

## 2016-10-07 NOTE — Progress Notes (Signed)
Kristen Swanson missed an appointment she scheduled for yearly checkup. Per discussion with provider - no need to call patient- may reschedule if she calls.

## 2016-10-11 ENCOUNTER — Encounter (HOSPITAL_BASED_OUTPATIENT_CLINIC_OR_DEPARTMENT_OTHER): Payer: Self-pay | Admitting: Emergency Medicine

## 2016-10-11 ENCOUNTER — Emergency Department (HOSPITAL_BASED_OUTPATIENT_CLINIC_OR_DEPARTMENT_OTHER): Payer: Medicaid Other

## 2016-10-11 ENCOUNTER — Emergency Department (HOSPITAL_BASED_OUTPATIENT_CLINIC_OR_DEPARTMENT_OTHER)
Admission: EM | Admit: 2016-10-11 | Discharge: 2016-10-11 | Disposition: A | Discharge status: Home / Self Care | Payer: Medicaid Other | Attending: Emergency Medicine | Admitting: Emergency Medicine

## 2016-10-11 DIAGNOSIS — I10 Essential (primary) hypertension: Secondary | ICD-10-CM | POA: Insufficient documentation

## 2016-10-11 DIAGNOSIS — M79641 Pain in right hand: Secondary | ICD-10-CM | POA: Diagnosis not present

## 2016-10-11 DIAGNOSIS — Z96652 Presence of left artificial knee joint: Secondary | ICD-10-CM | POA: Diagnosis not present

## 2016-10-11 DIAGNOSIS — F909 Attention-deficit hyperactivity disorder, unspecified type: Secondary | ICD-10-CM | POA: Insufficient documentation

## 2016-10-11 DIAGNOSIS — Z79899 Other long term (current) drug therapy: Secondary | ICD-10-CM | POA: Insufficient documentation

## 2016-10-11 DIAGNOSIS — M79601 Pain in right arm: Secondary | ICD-10-CM | POA: Insufficient documentation

## 2016-10-11 DIAGNOSIS — J45909 Unspecified asthma, uncomplicated: Secondary | ICD-10-CM | POA: Diagnosis not present

## 2016-10-11 DIAGNOSIS — R42 Dizziness and giddiness: Secondary | ICD-10-CM | POA: Diagnosis present

## 2016-10-11 DIAGNOSIS — R51 Headache: Secondary | ICD-10-CM | POA: Diagnosis not present

## 2016-10-11 LAB — URINALYSIS, ROUTINE W REFLEX MICROSCOPIC
Bilirubin Urine: NEGATIVE
Glucose, UA: NEGATIVE mg/dL
Hgb urine dipstick: NEGATIVE
KETONES UR: 15 mg/dL — AB
Leukocytes, UA: NEGATIVE
Nitrite: NEGATIVE
PROTEIN: NEGATIVE mg/dL
Specific Gravity, Urine: 1.013 (ref 1.005–1.030)
pH: 6 (ref 5.0–8.0)

## 2016-10-11 LAB — CBC
HCT: 28.6 % — ABNORMAL LOW (ref 36.0–46.0)
Hemoglobin: 8.6 g/dL — ABNORMAL LOW (ref 12.0–15.0)
MCH: 21.7 pg — AB (ref 26.0–34.0)
MCHC: 30.1 g/dL (ref 30.0–36.0)
MCV: 72 fL — AB (ref 78.0–100.0)
Platelets: 378 10*3/uL (ref 150–400)
RBC: 3.97 MIL/uL (ref 3.87–5.11)
RDW: 18.5 % — AB (ref 11.5–15.5)
WBC: 6.2 10*3/uL (ref 4.0–10.5)

## 2016-10-11 LAB — BASIC METABOLIC PANEL
Anion gap: 8 (ref 5–15)
BUN: 9 mg/dL (ref 6–20)
CALCIUM: 9.1 mg/dL (ref 8.9–10.3)
CHLORIDE: 102 mmol/L (ref 101–111)
CO2: 26 mmol/L (ref 22–32)
Creatinine, Ser: 0.72 mg/dL (ref 0.44–1.00)
GFR calc Af Amer: >60 mL/min (ref >60)
GFR calc non Af Amer: >60 mL/min (ref >60)
Glucose, Bld: 102 mg/dL — ABNORMAL HIGH (ref 65–99)
Potassium: 3.6 mmol/L (ref 3.5–5.1)
SODIUM: 136 mmol/L (ref 135–145)

## 2016-10-11 LAB — PREGNANCY, URINE: Preg Test, Ur: NEGATIVE

## 2016-10-11 MED ORDER — FERROUS SULFATE 325 (65 FE) MG PO TABS: 325.0000 mg | ORAL_TABLET | Freq: Every day | ORAL | 0 refills | Status: DC

## 2016-10-11 NOTE — ED Notes (Signed)
Pt now reports that she believes she is anemic. Her PCP's office is closing and told her that she needs to get established with another provider. States she has iron pill but they make her constipated.

## 2016-10-11 NOTE — Discharge Instructions (Signed)
Follow-up with your primary care physician.  Return to ER as needed for any new or worsening symptoms.

## 2016-10-11 NOTE — ED Triage Notes (Signed)
Pt reports dizziness with numbness in the right arm since Monday. Reports also feeling confused on Sunday while at church. Pt is currently alert and oriented answering questions appropriately. States "maybe I just slept on my arm the wrong way."

## 2016-10-11 NOTE — ED Provider Notes (Addendum)
[facility] Provider Note   CSN: [id number] Arrival date & time: 10/11/16  1111     History   Chief Complaint Chief Complaint  Patient presents with  . Dizziness  . Numbness    HPI Kristen Swanson is a 40 y.o. female.  She presents with 5 days of right arm symptoms. She describes the right arm is heavy. She demonstrates that she holds her right arm out with normal strength but tender her deltoid states that it is sore and this makes it difficult to hold her arm up. She felt this on Monday, 4 days ago upon awakening. No fall no injury or trauma. She really has no additional symptoms. She became concerned because her father had TIAs and ultimately had a stroke.  Physical history of hypertension. Admits she is not good about taking her medications. No history of diabetes. She states her physician did an A1c on her less than 5. She is not a smoker. She denies drug and alcohol use.    HPI  Past Medical History:  Diagnosis Date  . ADHD (attention deficit hyperactivity disorder)   . Asthma   . Hypertension     Patient Active Problem List   Diagnosis Date Noted  . Fibroid uterus 01/01/2016  . Menorrhagia 01/01/2016  . ONYCHOMYCOSIS 02/26/2007  . OBESITY, NOS 02/26/2007  . ANEMIA, OTHER, UNSPECIFIED 02/26/2007  . ASTHMA, UNSPECIFIED 02/26/2007    Past Surgical History:  Procedure Laterality Date  . LEG SURGERY    . REPLACEMENT TOTAL KNEE Left 2008    OB History    Gravida Para Term Preterm AB Living   5 3 3   2 3    SAB TAB Ectopic Multiple Live Births   1               Home Medications    Prior to Admission medications   Medication Sig Start Date End Date Taking? Authorizing Provider  cyclobenzaprine (FLEXERIL) 5 MG tablet Take 1 tablet (5 mg total) by mouth 3 (three) times daily as needed for muscle spasms. 10/26/15  Yes Serita Grit, MD  metoprolol tartrate (LOPRESSOR) 25 MG tablet Take 25 mg by mouth 2 (two) times daily. Reported on 01/01/2016   Yes  Historical Provider, MD  traMADol (ULTRAM) 50 MG tablet Take 1 tablet (50 mg total) by mouth every 6 (six) hours as needed. 05/22/16  Yes Gwen Pounds, CNM  valACYclovir (VALTREX) 500 MG tablet Take 500 mg by mouth daily. 12/27/15  Yes Historical Provider, MD  zolpidem (AMBIEN) 10 MG tablet  11/20/15  Yes Historical Provider, MD  amphetamine-dextroamphetamine (ADDERALL XR) 20 MG 24 hr capsule Take 20 mg by mouth daily.    Historical Provider, MD  hydrochlorothiazide (HYDRODIURIL) 25 MG tablet Take 25 mg by mouth daily.    Historical Provider, MD  HYDROcodone-acetaminophen (NORCO/VICODIN) 5-325 MG per tablet Take 1-2 tablets by mouth every 4 (four) hours as needed. 01/29/14   Charlann Lange, PA-C  Hydrocodone-APAP-Dietary Prod (HYDROCODONE-APAP-NUTRIT SUPP) 10-650 MG MISC Take 1 tablet by mouth daily as needed. Patient takes this medication for pain.    Historical Provider, MD  ibuprofen (ADVIL,MOTRIN) 800 MG tablet Take 800 mg by mouth every 8 (eight) hours as needed. Patient used this medication for headaches and leg pain.    Historical Provider, MD    Family History No family history on file.  Social History Social History  Substance Use Topics  . Smoking status: Never Smoker  . Smokeless tobacco: Never Used  .  Alcohol use No     Allergies   Review of patient's allergies indicates no known allergies.   Review of Systems Review of Systems  Constitutional: Negative for appetite change, chills, diaphoresis, fatigue and fever.  HENT: Negative for mouth sores, sore throat and trouble swallowing.   Eyes: Negative for visual disturbance.  Respiratory: Negative for cough, chest tightness, shortness of breath and wheezing.   Cardiovascular: Negative for chest pain.  Gastrointestinal: Negative for abdominal distention, abdominal pain, diarrhea, nausea and vomiting.  Endocrine: Negative for polydipsia, polyphagia and polyuria.  Genitourinary: Negative for dysuria, frequency and hematuria.    Musculoskeletal: Positive for arthralgias. Negative for gait problem.       Some discomfort near the deltoid with movement of the right arm.  Skin: Negative for color change, pallor and rash.  Neurological: Negative for dizziness, syncope, light-headedness and headaches.  Hematological: Does not bruise/bleed easily.  Psychiatric/Behavioral: Negative for behavioral problems and confusion.     Physical Exam Updated Vital Signs BP 124/76   Pulse 84   Resp 21   LMP 09/10/2016   SpO2 99%   Physical Exam  Constitutional: She is oriented to person, place, and time. She appears well-developed and well-nourished. No distress.  HENT:  Head: Normocephalic.  Eyes: Conjunctivae are normal. Pupils are equal, round, and reactive to light. No scleral icterus.  Neck: Normal range of motion. Neck supple. No thyromegaly present.  Cardiovascular: Normal rate and regular rhythm.  Exam reveals no gallop and no friction rub.   No murmur heard. Pulmonary/Chest: Effort normal and breath sounds normal. No respiratory distress. She has no wheezes. She has no rales.  Abdominal: Soft. Bowel sounds are normal. She exhibits no distension. There is no tenderness. There is no rebound.  Musculoskeletal: Normal range of motion.  Neurological: She is alert and oriented to person, place, and time.  No pronator drift. No leg drift. Normal intact symmetric cranial nerves. No wrist drop. Normal intrinsic function. Normal grip strength. Normal flexion extension elbow. No findings to suggest peripheral neuropathy/radial nerve palsy etc.  Skin: Skin is warm and dry. No rash noted.  Psychiatric: She has a normal mood and affect. Her behavior is normal.     ED Treatments / Results  Labs (all labs ordered are listed, but only abnormal results are displayed) Labs Reviewed  BASIC METABOLIC PANEL - Abnormal; Notable for the following:       Result Value   Glucose, Bld 102 (*)    All other components within normal limits   CBC - Abnormal; Notable for the following:    Hemoglobin 8.6 (*)    HCT 28.6 (*)    MCV 72.0 (*)    MCH 21.7 (*)    RDW 18.5 (*)    All other components within normal limits  URINALYSIS, ROUTINE W REFLEX MICROSCOPIC (NOT AT [facility]) - Abnormal; Notable for the following:    APPearance CLOUDY (*)    Ketones, ur 15 (*)    All other components within normal limits  PREGNANCY, URINE    EKG  EKG Interpretation None       Radiology Ct Head Wo Contrast  Result Date: 10/11/2016 CLINICAL DATA:  Right arm weakness and numbness for 5 days. Headaches, dizziness. EXAM: CT HEAD WITHOUT CONTRAST TECHNIQUE: Contiguous axial images were obtained from the base of the skull through the vertex without intravenous contrast. COMPARISON:  None. FINDINGS: Brain: No mass effect or midline shift is noted. Ventricular size is within normal limits. There is no  evidence of mass lesion, hemorrhage or acute infarction. Vascular: No abnormality seen. Skull: Bony calvarium appears intact. Sinuses/Orbits: Mild bilateral ethmoid sinusitis is noted. Other: None. IMPRESSION: Normal head CT. Electronically Signed   By: Marijo Conception, M.D.   On: 10/11/2016 13:24    Procedures Procedures (including critical care time)  Medications Ordered in ED Medications - No data to display   Initial Impression / Assessment and Plan / ED Course  I have reviewed the triage vital signs and the nursing notes.  Pertinent labs & imaging results that were available during my care of the patient were reviewed by me and considered in my medical decision making (see chart for details).  Clinical Course   Patient does not have neurological deficits. She has had symptoms for more than 4 days. She has normal head CT. I think should appropriate for discharge home. Her symptoms seem musculoskeletal.   Final Clinical Impressions(s) / ED Diagnoses   Final diagnoses:  Pain of right upper extremity    New Prescriptions New  Prescriptions   No medications on file     Tanna Furry, MD 10/11/16 1443    Tanna Furry, MD 10/11/16 1444

## 2016-10-11 NOTE — ED Notes (Signed)
Blood draw attempted x2. Another staff person asked to attempt.

## 2016-10-11 NOTE — ED Notes (Signed)
MD at bedside. 

## 2016-10-13 ENCOUNTER — Emergency Department (HOSPITAL_COMMUNITY): Payer: Medicaid Other

## 2016-10-13 ENCOUNTER — Encounter (HOSPITAL_COMMUNITY): Payer: Self-pay | Admitting: *Deleted

## 2016-10-13 ENCOUNTER — Emergency Department (HOSPITAL_COMMUNITY)
Admission: EM | Admit: 2016-10-13 | Discharge: 2016-10-14 | Disposition: A | Discharge status: Home / Self Care | Payer: Medicaid Other | Attending: Emergency Medicine | Admitting: Emergency Medicine

## 2016-10-13 DIAGNOSIS — N1330 Unspecified hydronephrosis: Secondary | ICD-10-CM | POA: Insufficient documentation

## 2016-10-13 DIAGNOSIS — F909 Attention-deficit hyperactivity disorder, unspecified type: Secondary | ICD-10-CM | POA: Insufficient documentation

## 2016-10-13 DIAGNOSIS — N134 Hydroureter: Secondary | ICD-10-CM | POA: Diagnosis not present

## 2016-10-13 DIAGNOSIS — N83201 Unspecified ovarian cyst, right side: Secondary | ICD-10-CM | POA: Insufficient documentation

## 2016-10-13 DIAGNOSIS — R079 Chest pain, unspecified: Secondary | ICD-10-CM | POA: Diagnosis not present

## 2016-10-13 DIAGNOSIS — J45909 Unspecified asthma, uncomplicated: Secondary | ICD-10-CM | POA: Insufficient documentation

## 2016-10-13 DIAGNOSIS — D259 Leiomyoma of uterus, unspecified: Secondary | ICD-10-CM | POA: Diagnosis not present

## 2016-10-13 DIAGNOSIS — E876 Hypokalemia: Secondary | ICD-10-CM | POA: Diagnosis not present

## 2016-10-13 DIAGNOSIS — D250 Submucous leiomyoma of uterus: Secondary | ICD-10-CM | POA: Diagnosis not present

## 2016-10-13 DIAGNOSIS — R05 Cough: Secondary | ICD-10-CM | POA: Diagnosis not present

## 2016-10-13 DIAGNOSIS — I10 Essential (primary) hypertension: Secondary | ICD-10-CM | POA: Insufficient documentation

## 2016-10-13 DIAGNOSIS — Z79899 Other long term (current) drug therapy: Secondary | ICD-10-CM | POA: Insufficient documentation

## 2016-10-13 DIAGNOSIS — R1031 Right lower quadrant pain: Secondary | ICD-10-CM

## 2016-10-13 DIAGNOSIS — N1339 Other hydronephrosis: Secondary | ICD-10-CM | POA: Diagnosis not present

## 2016-10-13 DIAGNOSIS — J209 Acute bronchitis, unspecified: Secondary | ICD-10-CM | POA: Diagnosis not present

## 2016-10-13 HISTORY — DX: Calculus of kidney: N20.0

## 2016-10-13 HISTORY — DX: Benign neoplasm of connective and other soft tissue, unspecified: D21.9

## 2016-10-13 LAB — URINALYSIS, ROUTINE W REFLEX MICROSCOPIC
Bilirubin Urine: NEGATIVE
Glucose, UA: NEGATIVE mg/dL
Hgb urine dipstick: NEGATIVE
Ketones, ur: NEGATIVE mg/dL
LEUKOCYTES UA: NEGATIVE
Nitrite: NEGATIVE
Protein, ur: NEGATIVE mg/dL
SPECIFIC GRAVITY, URINE: 1.006 (ref 1.005–1.030)
pH: 6.5 (ref 5.0–8.0)

## 2016-10-13 LAB — COMPREHENSIVE METABOLIC PANEL
ALT: 12 U/L — ABNORMAL LOW (ref 14–54)
ANION GAP: 7 (ref 5–15)
AST: 29 U/L (ref 15–41)
Albumin: 4 g/dL (ref 3.5–5.0)
Alkaline Phosphatase: 56 U/L (ref 38–126)
BUN: 6 mg/dL (ref 6–20)
CALCIUM: 9.1 mg/dL (ref 8.9–10.3)
CHLORIDE: 100 mmol/L — AB (ref 101–111)
CO2: 27 mmol/L (ref 22–32)
Creatinine, Ser: 0.69 mg/dL (ref 0.44–1.00)
GFR calc non Af Amer: >60 mL/min (ref >60)
Glucose, Bld: 93 mg/dL (ref 65–99)
Potassium: 2.8 mmol/L — ABNORMAL LOW (ref 3.5–5.1)
SODIUM: 134 mmol/L — AB (ref 135–145)
Total Bilirubin: 0.5 mg/dL (ref 0.3–1.2)
Total Protein: 9 g/dL — ABNORMAL HIGH (ref 6.5–8.1)

## 2016-10-13 LAB — CBC
HCT: 29.7 % — ABNORMAL LOW (ref 36.0–46.0)
HEMOGLOBIN: 9.2 g/dL — AB (ref 12.0–15.0)
MCH: 21.7 pg — AB (ref 26.0–34.0)
MCHC: 31 g/dL (ref 30.0–36.0)
MCV: 70 fL — AB (ref 78.0–100.0)
Platelets: 350 10*3/uL (ref 150–400)
RBC: 4.24 MIL/uL (ref 3.87–5.11)
RDW: 18.1 % — ABNORMAL HIGH (ref 11.5–15.5)
WBC: 5.7 10*3/uL (ref 4.0–10.5)

## 2016-10-13 LAB — LIPASE, BLOOD: LIPASE: 28 U/L (ref 11–51)

## 2016-10-13 LAB — I-STAT BETA HCG BLOOD, ED (MC, WL, AP ONLY)

## 2016-10-13 MED ORDER — POTASSIUM CHLORIDE 10 MEQ/100ML IV SOLN
10.0000 meq | Freq: Once | INTRAVENOUS | Status: AC
  Administered 2016-10-14: 10 meq via INTRAVENOUS
  Filled 2016-10-13: qty 100

## 2016-10-13 MED ORDER — MORPHINE SULFATE (PF) 4 MG/ML IV SOLN
4.0000 mg | Freq: Once | INTRAVENOUS | Status: AC
  Administered 2016-10-14: 4 mg via INTRAVENOUS
  Filled 2016-10-13: qty 1

## 2016-10-13 MED ORDER — BENZONATATE 100 MG PO CAPS
200.0000 mg | ORAL_CAPSULE | Freq: Once | ORAL | Status: AC
  Administered 2016-10-13: 200 mg via ORAL
  Filled 2016-10-13: qty 2

## 2016-10-13 MED ORDER — ONDANSETRON 4 MG PO TBDP
4.0000 mg | ORAL_TABLET | Freq: Once | ORAL | Status: AC | PRN
  Administered 2016-10-13: 4 mg via ORAL
  Filled 2016-10-13: qty 1

## 2016-10-13 MED ORDER — IPRATROPIUM-ALBUTEROL 0.5-2.5 (3) MG/3ML IN SOLN
3.0000 mL | Freq: Once | RESPIRATORY_TRACT | Status: AC
  Administered 2016-10-13: 3 mL via RESPIRATORY_TRACT
  Filled 2016-10-13: qty 3

## 2016-10-13 MED ORDER — ONDANSETRON HCL 4 MG/2ML IJ SOLN
4.0000 mg | Freq: Once | INTRAMUSCULAR | Status: AC
  Administered 2016-10-14: 4 mg via INTRAVENOUS
  Filled 2016-10-13: qty 2

## 2016-10-13 MED ORDER — SODIUM CHLORIDE 0.9 % IV BOLUS (SEPSIS)
1000.0000 mL | Freq: Once | INTRAVENOUS | Status: AC
  Administered 2016-10-14: 1000 mL via INTRAVENOUS

## 2016-10-13 NOTE — ED Triage Notes (Addendum)
Per pt. She has h/o kidney stones and fibroid tumor. She has 9/10 abdominal pain onset today. Pain located RLQ and radiating to lower back. Denies fever, vomiting, diarrhea. She admits to feeling nauseated. Has taken Tramadol today, which helped a little with the pain. LBM: 10/11/16. She also has upper respiratory sx's she has been dealing with: runny nose, sore throat, productive cough and pain in chest with cough.

## 2016-10-13 NOTE — ED Notes (Signed)
Attempted x 2 without success to initiate IV access.

## 2016-10-13 NOTE — ED Notes (Signed)
Pt assisted to BR.  Pt stated "I went a lot"

## 2016-10-13 NOTE — ED Triage Notes (Addendum)
Pt BIB [facility] for pain in abdomen onset today. Pain located in RLQ. Non rigid but grimaced with palpation. She c/o nausea, no vomiting or diarrhea. She states she had a cough and has Tramadol. She has a h/o kidney stones.

## 2016-10-13 NOTE — ED Notes (Signed)
Patient transported to CT 

## 2016-10-13 NOTE — ED Provider Notes (Signed)
[facility] DEPT Provider Note   CSN: [id number] Arrival date & time: 10/13/16  1929   By signing my name below, I, Kristen Swanson, attest that this documentation has been prepared under the direction and in the presence of Rolondo Pierre PA-C. Electronically Signed: Macon Swanson, ED Scribe. 10/13/16. 10:29 PM.  History   Chief Complaint Chief Complaint  Patient presents with  . Abdominal Pain   The history is provided by the patient. No language interpreter was used.   HPI Comments: Kristen Swanson is a 40 y.o. female with h/o of kidney stones who presents to the Emergency Department complaining of gradual onset, 9/10, constant, RLQ abdominal cramping onset 7 hours ago.  Pain is severe and radiates around her right flank, and is exacerbated with movement, palpation and with attempting to urinate.  Pain is associated with nausea, subjective fevers, chills, dysuria and urinary incontinence.  She took 1 tramadol at home earlier today with mild relief however pain never resolved and began to worsen so she came to the ER for evaluation.  She also attemped to drink 4 bottles of water to attempt to "flush it out," however she had no improvement and there are no other alleviating factors.  She also thought she might have a urinary tract infection or thought it was period cramps which are normally severe, with hx of abnormal uterine bleeding, uterine fibroids and menorrhagia.  She also reports Phx of kidney stones (15 years ago) and pain today is similar. She denies seeing a urologist, denies any complications or interventions.  She denies vomiting, hematuria, vaginal symptoms, vaginal itching. LMP was 09/10/2016. The patient is not sexually active for the past several years. She also reports having a productive cough and URI symptoms with clear to yellow mucus, no hemoptysis, assist with wheeze.  No shortness of breath, chest pain, sweats, rash.  She has been using her daughter's inhaler with some  relief. She denies smoking history. No other acute or specific complaints   Past Medical History:  Diagnosis Date  . ADHD (attention deficit hyperactivity disorder)   . Asthma   . Fibroid tumor   . Hypertension   . Kidney stones     Patient Active Problem List   Diagnosis Date Noted  . Fibroid uterus 01/01/2016  . Menorrhagia 01/01/2016  . ONYCHOMYCOSIS 02/26/2007  . OBESITY, NOS 02/26/2007  . ANEMIA, OTHER, UNSPECIFIED 02/26/2007  . ASTHMA, UNSPECIFIED 02/26/2007    Past Surgical History:  Procedure Laterality Date  . LEG SURGERY    . REPLACEMENT TOTAL KNEE Left 2008    OB History    Gravida Para Term Preterm AB Living   5 3 3   2 3    SAB TAB Ectopic Multiple Live Births   1               Home Medications    Prior to Admission medications   Medication Sig Start Date End Date Taking? Authorizing Provider  amphetamine-dextroamphetamine (ADDERALL XR) 20 MG 24 hr capsule Take 20 mg by mouth daily.   Yes Historical Provider, MD  ferrous sulfate 325 (65 FE) MG tablet Take 1 tablet (325 mg total) by mouth daily with breakfast. Patient taking differently: Take 325 mg by mouth at bedtime.  10/11/16  Yes Tanna Furry, MD  hydrochlorothiazide (HYDRODIURIL) 25 MG tablet Take 25 mg by mouth daily.   Yes Historical Provider, MD  HYDROcodone-acetaminophen (NORCO) 10-325 MG tablet Take 1 tablet by mouth every 6 (six) hours as needed for moderate pain.  Yes Historical Provider, MD  ibuprofen (ADVIL,MOTRIN) 800 MG tablet Take 800 mg by mouth every 8 (eight) hours as needed. Patient used this medication for headaches and leg pain.   Yes Historical Provider, MD  metoprolol tartrate (LOPRESSOR) 25 MG tablet Take 25 mg by mouth daily. Reported on 01/01/2016   Yes Historical Provider, MD  Multiple Vitamin (MULTIVITAMIN WITH MINERALS) TABS tablet Take 1 tablet by mouth daily.   Yes Historical Provider, MD  traMADol (ULTRAM) 50 MG tablet Take 1 tablet (50 mg total) by mouth every 6 (six) hours  as needed. 05/22/16  Yes Gwen Pounds, CNM  valACYclovir (VALTREX) 500 MG tablet Take 500 mg by mouth daily. 12/27/15  Yes Historical Provider, MD  zolpidem (AMBIEN) 10 MG tablet Take 10 mg by mouth at bedtime.  11/20/15  Yes Historical Provider, MD  albuterol (PROVENTIL HFA;VENTOLIN HFA) 108 (90 Base) MCG/ACT inhaler Inhale 2 puffs into the lungs every 4 (four) hours as needed for wheezing or shortness of breath. 10/14/16   Delsa Grana, PA-C  cyclobenzaprine (FLEXERIL) 5 MG tablet Take 1 tablet (5 mg total) by mouth 3 (three) times daily as needed for muscle spasms. Patient not taking: Reported on 10/13/2016 10/26/15   Serita Grit, MD  guaiFENesin-codeine 100-10 MG/5ML syrup Take 5 mLs by mouth 3 (three) times daily as needed for cough. 10/14/16   Delsa Grana, PA-C  ondansetron (ZOFRAN ODT) 4 MG disintegrating tablet Take 1 tablet (4 mg total) by mouth every 8 (eight) hours as needed for nausea or vomiting. 10/14/16   Delsa Grana, PA-C  oxyCODONE-acetaminophen (PERCOCET) 5-325 MG tablet Take 1 tablet by mouth every 4 (four) hours as needed. 10/14/16   Delsa Grana, PA-C  potassium chloride (K-DUR) 10 MEQ tablet Take 1 tablet (10 mEq total) by mouth 2 (two) times daily. 10/14/16 10/17/16  Delsa Grana, PA-C  predniSONE (DELTASONE) 20 MG tablet Take 2 tablets (40 mg total) by mouth daily. 10/14/16   Delsa Grana, PA-C    Family History History reviewed. No pertinent family history.  Social History Social History  Substance Use Topics  . Smoking status: Never Smoker  . Smokeless tobacco: Never Used  . Alcohol use No     Allergies   Review of patient's allergies indicates no known allergies.   Review of Systems Review of Systems  All other systems reviewed and are negative.   10 Systems reviewed and all are negative for acute change except as noted in the HPI.  Physical Exam Updated Vital Signs BP 110/82 (BP Location: Right Arm)   Pulse 79   Temp 98.2 F (36.8 C) (Oral)   Resp  18   Ht 5\' 9"  (1.753 m)   Wt 107 kg   LMP 09/10/2016   SpO2 100%   BMI 34.85 kg/m   Physical Exam  Constitutional: She appears well-developed and well-nourished. No distress.  HENT:  Head: Normocephalic and atraumatic.  Nose: Nose normal.  Mouth/Throat: Oropharynx is clear and moist. No oropharyngeal exudate.  Eyes: Conjunctivae and EOM are normal. Pupils are equal, round, and reactive to light. Right eye exhibits no discharge. Left eye exhibits no discharge.  Neck: Normal range of motion.  Cardiovascular: Normal rate, regular rhythm, normal heart sounds and intact distal pulses.  Exam reveals no gallop and no friction rub.   No murmur heard. Pulmonary/Chest: Effort normal. No respiratory distress. She has wheezes. She has no rales. She exhibits no tenderness.  Intermittent cough with notable wheezing. Symmetrical chest expansion. No retractions. Left expiratory  wheeze in left, middle and lower lung fields. Mild expiratory wheeze in right lung. No rales or rhonchi.   Abdominal: Soft. Bowel sounds are normal. She exhibits no distension and no mass. There is tenderness. There is guarding. There is no rebound. No hernia.    TTP generalized to lower haft of abdomen below umbilicus with voluntary guarding Bilateral CVA tenderness.  Negative Murphy's.  No rebound tenderness.   Musculoskeletal: Normal range of motion.  Neurological: She is alert. She exhibits normal muscle tone. Coordination normal.  Skin: Skin is warm and dry. No rash noted. She is not diaphoretic. No erythema.  Psychiatric: She has a normal mood and affect. Her behavior is normal. Judgment and thought content normal.  Nursing note and vitals reviewed.    ED Treatments / Results   DIAGNOSTIC STUDIES: Oxygen Saturation is 100% on RA, normal by my interpretation.    COORDINATION OF CARE: 10:02 PM Discussed treatment plan with pt at bedside which includes CXR, Renal CT, labs, pain and nausea medications and pt agreed  to plan.  Labs (all labs ordered are listed, but only abnormal results are displayed) Labs Reviewed  COMPREHENSIVE METABOLIC PANEL - Abnormal; Notable for the following:       Result Value   Sodium 134 (*)    Potassium 2.8 (*)    Chloride 100 (*)    Total Protein 9.0 (*)    ALT 12 (*)    All other components within normal limits  CBC - Abnormal; Notable for the following:    Hemoglobin 9.2 (*)    HCT 29.7 (*)    MCV 70.0 (*)    MCH 21.7 (*)    RDW 18.1 (*)    All other components within normal limits  LIPASE, BLOOD  URINALYSIS, ROUTINE W REFLEX MICROSCOPIC (NOT AT [facility])  I-STAT BETA HCG BLOOD, ED ([facility], [facility], [facility] ONLY)    EKG  EKG Interpretation None       Radiology Dg Chest 2 View  Result Date: 10/13/2016 CLINICAL DATA:  Generalized chest pain and productive cough for 5 days. Nonsmoker. History of asthma. EXAM: CHEST  2 VIEW COMPARISON:  01/31/2015 FINDINGS: The heart size and mediastinal contours are within normal limits. Both lungs are clear. The visualized skeletal structures demonstrate mild dextro convex curvature of the thoracic spine, unchanged in appearance. IMPRESSION: No acute cardiopulmonary disease. Electronically Signed   By: Ashley Royalty M.D.   On: 10/13/2016 23:17   US Transvaginal Non-ob  Result Date: 10/14/2016 CLINICAL DATA:  Right lower quadrant pain, right adnexal mass seen on recent CT EXAM: TRANSABDOMINAL AND TRANSVAGINAL ULTRASOUND OF PELVIS TECHNIQUE: Both transabdominal and transvaginal ultrasound examinations of the pelvis were performed. Transabdominal technique was performed for global imaging of the pelvis including uterus, ovaries, adnexal regions, and pelvic cul-de-sac. It was necessary to proceed with endovaginal exam following the transabdominal exam to visualize the the ovaries and endometrium. COMPARISON:  CT from 10/13/2016, pelvic ultrasound 02/15/2016 FINDINGS: Uterus Measurements: Approximately 9.9 x 5.5 cm in length by AP. Two right-sided  intramural and slightly sub serosal fibroids are noted measuring 3.1 x 3.4 x 2.8 cm anteriorly and 2.7 x 2.5 x 2.6 cm posteriorly. There is a posterior uterine submucosal fibroid measuring 3.3 x 3.1 x 3.6 cm. Endometrium Thickness: 15 mm.  No focal abnormality. Right ovary Measurements: 4.9 x 3.8 x 4.9 cm. Color Doppler flow is seen. There is a homogeneously hypoechoic well-circumscribed 3 x 3.3 x 3.8 cm lesion with faint lace-like appearance suggestive of  a hemorrhagic cyst. Differential possibilities might include a small endometrioma. Left ovary Measurements: 3.4 x 2 .5 x 2.3 cm. Normal appearance/no adnexal mass. Other findings Trace free-fluid. IMPRESSION: Complex right ovarian cyst noted measuring 3 x 3.3 x 3.8 cm with a lace-like internal matrix suggestive of a hemorrhagic cyst. Trace free fluid. Follow-up to assure resolution recommended. This might account for the patient's right lower quadrant pain. Fibroid uterus, one is posterior and submucosal, from the uterine body measuring 3.3 x 3.1 x 3.6 cm. Electronically Signed   By: Ashley Royalty M.D.   On: 10/14/2016 03:19   US Pelvis Complete  Result Date: 10/14/2016 CLINICAL DATA:  Right lower quadrant pain, right adnexal mass seen on recent CT EXAM: TRANSABDOMINAL AND TRANSVAGINAL ULTRASOUND OF PELVIS TECHNIQUE: Both transabdominal and transvaginal ultrasound examinations of the pelvis were performed. Transabdominal technique was performed for global imaging of the pelvis including uterus, ovaries, adnexal regions, and pelvic cul-de-sac. It was necessary to proceed with endovaginal exam following the transabdominal exam to visualize the the ovaries and endometrium. COMPARISON:  CT from 10/13/2016, pelvic ultrasound 02/15/2016 FINDINGS: Uterus Measurements: Approximately 9.9 x 5.5 cm in length by AP. Two right-sided intramural and slightly sub serosal fibroids are noted measuring 3.1 x 3.4 x 2.8 cm anteriorly and 2.7 x 2.5 x 2.6 cm posteriorly. There is a  posterior uterine submucosal fibroid measuring 3.3 x 3.1 x 3.6 cm. Endometrium Thickness: 15 mm.  No focal abnormality. Right ovary Measurements: 4.9 x 3.8 x 4.9 cm. Color Doppler flow is seen. There is a homogeneously hypoechoic well-circumscribed 3 x 3.3 x 3.8 cm lesion with faint lace-like appearance suggestive of a hemorrhagic cyst. Differential possibilities might include a small endometrioma. Left ovary Measurements: 3.4 x 2 .5 x 2.3 cm. Normal appearance/no adnexal mass. Other findings Trace free-fluid. IMPRESSION: Complex right ovarian cyst noted measuring 3 x 3.3 x 3.8 cm with a lace-like internal matrix suggestive of a hemorrhagic cyst. Trace free fluid. Follow-up to assure resolution recommended. This might account for the patient's right lower quadrant pain. Fibroid uterus, one is posterior and submucosal, from the uterine body measuring 3.3 x 3.1 x 3.6 cm. Electronically Signed   By: Ashley Royalty M.D.   On: 10/14/2016 03:19   Ct Renal Stone Study  Result Date: 10/13/2016 CLINICAL DATA:  Abdominal pain localizing to right lower quadrant and radiating to lower back. EXAM: CT ABDOMEN AND PELVIS WITHOUT CONTRAST TECHNIQUE: Multidetector CT imaging of the abdomen and pelvis was performed following the standard protocol without IV contrast. COMPARISON:  Pelvic ultrasound 12/16/2011 FINDINGS: LOWER CHEST: Lung bases are clear. Included heart size is normal. No pericardial effusion. HEPATOBILIARY: Liver and gallbladder are normal. PANCREAS: Normal. SPLEEN: Normal. ADRENALS/URINARY TRACT: Kidneys are orthotopic. There is mild right-sided hydroureteronephrosis leading up to a 4.4 x 4.7 x 4.5 cm right adnexal mass believed to be the right ovary which appears enlarged and contains subtle area of hypodensity within possibly a cyst. Further characterization with ultrasound may prove useful. No definite ureteral stone. The unopacified ureters are normal in course and caliber on the left and normal in caliber  distal to the right adnexal mass. Conceivably this could be due to extrinsic mass effect on the ureter by the adnexal lesion. There may be 1 punctate calcification in the mid left kidney that is nonobstructing, series 2, image 22. Urinary bladder is physiologically distended and unremarkable. Normal adrenal glands. STOMACH/BOWEL: The stomach, small and Swanson bowel are normal in course and caliber without inflammatory changes. Normal  appendix. VASCULAR/LYMPHATIC: Aortoiliac vessels are normal in course and caliber. No lymphadenopathy by CT size criteria. REPRODUCTIVE: There appears to be a right adnexal mass as described above possibly representing the right ovary with cystic component that may be further correlated with ultrasound. The uterus is bulky in appearance consistent with fibroids. The largest appears to be slightly sub serosal and posterior on the right. OTHER: No intraperitoneal free fluid or free air. MUSCULOSKELETAL: Nonacute. IMPRESSION: 4.4 x 4.7 x 4.5 cm right adnexal mass believed to be the right ovary containing areas of hypodensity consistent with a cystic component. Further correlation with ultrasound may prove beneficial. This may be causing extrinsic mass effect on the right ureter causing mild right-sided hydroureteronephrosis. No obstructing calculus is identified. Nonobstructing left renal calcification. Electronically Signed   By: Ashley Royalty M.D.   On: 10/13/2016 23:16    Procedures Procedures (including critical care time)  Medications Ordered in ED Medications  ondansetron (ZOFRAN-ODT) disintegrating tablet 4 mg (4 mg Oral Given 10/13/16 1957)  sodium chloride 0.9 % bolus 1,000 mL (0 mLs Intravenous Stopped 10/14/16 0254)  potassium chloride 10 mEq in 100 mL IVPB (0 mEq Intravenous Stopped 10/14/16 0241)  ondansetron (ZOFRAN) injection 4 mg (4 mg Intravenous Given 10/14/16 0131)  morphine 4 MG/ML injection 4 mg (4 mg Intravenous Given 10/14/16 0132)  benzonatate (TESSALON)  capsule 200 mg (200 mg Oral Given 10/13/16 2320)  ipratropium-albuterol (DUONEB) 0.5-2.5 (3) MG/3ML nebulizer solution 3 mL (3 mLs Nebulization Given 10/13/16 2245)  potassium chloride SA (K-DUR,KLOR-CON) CR tablet 40 mEq (40 mEq Oral Given 10/14/16 0354)  predniSONE (DELTASONE) tablet 60 mg (60 mg Oral Given 10/14/16 0354)  albuterol (PROVENTIL HFA;VENTOLIN HFA) 108 (90 Base) MCG/ACT inhaler 2 puff (2 puffs Inhalation Given 10/14/16 0355)     Initial Impression / Assessment and Plan / ED Course  I have reviewed the triage vital signs and the nursing notes.  Pertinent labs & imaging results that were available during my care of the patient were reviewed by me and considered in my medical decision making (see chart for details).  Clinical Course  40 year old female patient presents emergency complaint of severe gradual onset of abdominal pain which is worse on the right side radiating around her right flank, associated with dysuria, urinary frequency and incontinence.  She was worked up for UTI/pyelo vs kidney stone.  Low risk for STD's, not sexually active.  Hx of uterine fibroids. CT renal stone study was begun for right adrenal mass possibly associated right ovary with a cystic component, recommended pelvic ultrasound.  There is associated mass effect with right-sided hydroureteronephrosis. Urinalysis was negative. Basic labwork was remarkable for hypokalemia of 2.8, and stable microcytic anemia. Chest x-rays negative for pneumonia.  Pelvic ultrasound shows complex right ovarian cyst suggestive of hemorrhagic cyst with trace free fluid.   Patient's pain was controlled in the ER, potassium was repleted, She was tolerating PO's.  Wheeze cleared with breathing tx.  Will treat for acute bronchitis.  Pt to follow up ASAP with OB/GYN.  Return precautions reviewed, patient verbalized understanding.  Discharged home in good condition with stable vital signs.  Final Clinical Impressions(s) / ED  Diagnoses   Final diagnoses:  Right ovarian cyst  Hydroureter, right  Hydronephrosis, right  Uterine leiomyoma, unspecified location  Right lower quadrant abdominal pain  Hypokalemia  Acute bronchitis, unspecified organism    New Prescriptions Discharge Medication List as of 10/14/2016  3:35 AM    START taking these medications   Details  albuterol (  PROVENTIL HFA;VENTOLIN HFA) 108 (90 Base) MCG/ACT inhaler Inhale 2 puffs into the lungs every 4 (four) hours as needed for wheezing or shortness of breath., Starting Mon 10/14/2016, Print    guaiFENesin-codeine 100-10 MG/5ML syrup Take 5 mLs by mouth 3 (three) times daily as needed for cough., Starting Mon 10/14/2016, Print    ondansetron (ZOFRAN ODT) 4 MG disintegrating tablet Take 1 tablet (4 mg total) by mouth every 8 (eight) hours as needed for nausea or vomiting., Starting Mon 10/14/2016, Print    oxyCODONE-acetaminophen (PERCOCET) 5-325 MG tablet Take 1 tablet by mouth every 4 (four) hours as needed., Starting Mon 10/14/2016, Print    potassium chloride (K-DUR) 10 MEQ tablet Take 1 tablet (10 mEq total) by mouth 2 (two) times daily., Starting Mon 10/14/2016, Until Thu 10/17/2016, Print    predniSONE (DELTASONE) 20 MG tablet Take 2 tablets (40 mg total) by mouth daily., Starting Mon 10/14/2016, Print       I personally performed the services described in this documentation, which was scribed in my presence. The recorded information has been reviewed and is accurate.      Delsa Grana, PA-C 10/14/16 [id number]    Isla Pence, MD 10/16/16 1019

## 2016-10-14 ENCOUNTER — Emergency Department (HOSPITAL_COMMUNITY): Payer: Medicaid Other

## 2016-10-14 MED ORDER — OXYCODONE-ACETAMINOPHEN 5-325 MG PO TABS: 1.0000 | ORAL_TABLET | ORAL | 0 refills | Status: DC | PRN

## 2016-10-14 MED ORDER — ALBUTEROL SULFATE HFA 108 (90 BASE) MCG/ACT IN AERS: 2.0000 | INHALATION_SPRAY | RESPIRATORY_TRACT | 3 refills | Status: DC | PRN

## 2016-10-14 MED ORDER — GUAIFENESIN-CODEINE 100-10 MG/5ML PO SOLN: 5.0000 mL | Freq: Three times a day (TID) | ORAL | 0 refills | Status: DC | PRN

## 2016-10-14 MED ORDER — ALBUTEROL SULFATE HFA 108 (90 BASE) MCG/ACT IN AERS
2.0000 | INHALATION_SPRAY | Freq: Once | RESPIRATORY_TRACT | Status: AC
  Administered 2016-10-14: 2 via RESPIRATORY_TRACT
  Filled 2016-10-14: qty 6.7

## 2016-10-14 MED ORDER — POTASSIUM CHLORIDE ER 10 MEQ PO TBCR: 10.0000 meq | EXTENDED_RELEASE_TABLET | Freq: Two times a day (BID) | ORAL | 0 refills | Status: DC

## 2016-10-14 MED ORDER — POTASSIUM CHLORIDE CRYS ER 20 MEQ PO TBCR
40.0000 meq | EXTENDED_RELEASE_TABLET | Freq: Once | ORAL | Status: AC
  Administered 2016-10-14: 40 meq via ORAL
  Filled 2016-10-14: qty 2

## 2016-10-14 MED ORDER — ONDANSETRON 4 MG PO TBDP: 4.0000 mg | ORAL_TABLET | Freq: Three times a day (TID) | ORAL | 0 refills | Status: DC | PRN

## 2016-10-14 MED ORDER — PREDNISONE 20 MG PO TABS
60.0000 mg | ORAL_TABLET | Freq: Once | ORAL | Status: AC
  Administered 2016-10-14: 60 mg via ORAL
  Filled 2016-10-14: qty 3

## 2016-10-14 MED ORDER — PREDNISONE 20 MG PO TABS: 40.0000 mg | ORAL_TABLET | Freq: Every day | ORAL | 0 refills | Status: DC

## 2016-10-14 NOTE — ED Notes (Signed)
Patient transported to Ultrasound by RN

## 2016-10-14 NOTE — ED Notes (Signed)
Patient is alert and oriented x3.  She was given DC instructions and follow up visit instructions.  Patient gave verbal understanding. She was DC ambulatory under her own power to home.  V/S stable.  He was not showing any signs of distress on DC 

## 2016-10-14 NOTE — Discharge Instructions (Signed)
Call your OB/GYN for follow-up as soon as possible for follow up on ovarian cyst  Follow up with your PCP for bronchitis and hypokalemia.  Please have your potassium level rechecked in 1-2 weeks

## 2016-10-14 NOTE — ED Notes (Signed)
Pt in [place]

## 2016-10-14 NOTE — ED Notes (Signed)
[place] calling for patient.

## 2016-10-27 ENCOUNTER — Emergency Department (HOSPITAL_BASED_OUTPATIENT_CLINIC_OR_DEPARTMENT_OTHER): Payer: Medicaid Other

## 2016-10-27 ENCOUNTER — Emergency Department (HOSPITAL_BASED_OUTPATIENT_CLINIC_OR_DEPARTMENT_OTHER)
Admission: EM | Admit: 2016-10-27 | Discharge: 2016-10-27 | Disposition: A | Discharge status: Home / Self Care | Payer: Medicaid Other | Attending: Emergency Medicine | Admitting: Emergency Medicine

## 2016-10-27 ENCOUNTER — Encounter (HOSPITAL_BASED_OUTPATIENT_CLINIC_OR_DEPARTMENT_OTHER): Payer: Self-pay | Admitting: *Deleted

## 2016-10-27 DIAGNOSIS — M79672 Pain in left foot: Secondary | ICD-10-CM | POA: Diagnosis not present

## 2016-10-27 DIAGNOSIS — F909 Attention-deficit hyperactivity disorder, unspecified type: Secondary | ICD-10-CM | POA: Diagnosis not present

## 2016-10-27 DIAGNOSIS — M7989 Other specified soft tissue disorders: Secondary | ICD-10-CM | POA: Diagnosis not present

## 2016-10-27 DIAGNOSIS — Z79899 Other long term (current) drug therapy: Secondary | ICD-10-CM | POA: Insufficient documentation

## 2016-10-27 DIAGNOSIS — J45909 Unspecified asthma, uncomplicated: Secondary | ICD-10-CM | POA: Diagnosis not present

## 2016-10-27 DIAGNOSIS — M25572 Pain in left ankle and joints of left foot: Secondary | ICD-10-CM | POA: Diagnosis not present

## 2016-10-27 DIAGNOSIS — I10 Essential (primary) hypertension: Secondary | ICD-10-CM | POA: Insufficient documentation

## 2016-10-27 MED ORDER — HYDROCODONE-ACETAMINOPHEN 5-325 MG PO TABS
1.0000 | ORAL_TABLET | Freq: Once | ORAL | Status: AC
Start: 2016-10-27 — End: 2016-10-27
  Administered 2016-10-27: 1 via ORAL
  Filled 2016-10-27: qty 1

## 2016-10-27 MED ORDER — HYDROCODONE-ACETAMINOPHEN 5-325 MG PO TABS
1.0000 | ORAL_TABLET | Freq: Once | ORAL | Status: AC
  Administered 2016-10-27: 1 via ORAL
  Filled 2016-10-27: qty 1

## 2016-10-27 MED ORDER — NAPROXEN 500 MG PO TABS: 500.0000 mg | ORAL_TABLET | Freq: Two times a day (BID) | ORAL | 0 refills | Status: DC

## 2016-10-27 NOTE — Discharge Instructions (Signed)
Read the information below.  Your imaging was re-assuring. No evidence of fracture or dislocation. No evidence of blood clot. This may be musculoskeletal. You are being provided an ace wrap. Continue to ice and elevate for 20 minute increments. You are being prescribed naprosyn for pain and swelling relief. While taking naprosyn do not take other NSAIDs (ibuprofen, motrin, aleve).  Use the prescribed medication as directed.  Please discuss all new medications with your pharmacist.   I have provided resources for establishing a primary provider. I have also provided the contact information for [facility]. Please call for a follow up appointment.  You may return to the Emergency Department at any time for worsening condition or any new symptoms that concern you.

## 2016-10-27 NOTE — ED Provider Notes (Signed)
[facility] DEPT [facility] Provider Note   CSN: [id number] Arrival date & time: 10/27/16  1615  By signing my name below, I, Delton Prairie, attest that this documentation has been prepared under the direction and in the presence of  Gay Filler, PA-C. Electronically Signed: Delton Prairie, ED Scribe. 10/27/16. 5:02 PM.  History   Chief Complaint Chief Complaint  Patient presents with  . Foot Pain    The history is provided by the patient. No language interpreter was used.   HPI Comments:  Kristen Swanson is a 40 y.o. female, with a hx of surgery to her L leg, HTN who presents to the Emergency Department complaining of sudden onset moderate L sided foot pain and swelling x 4 days. She notes her L leg usually hurts at her baseline. Pt states she awoke and noticed swelling to her L foot with associated pain, worse with standing and walking. Pt denies any trauma to her foot, fevers, abdominal pain, vomiting, chest pain, SOB, trouble swallowing, weakness/numbness to her extremity, redness/ warmth to the area, insect bites, hemoptysis, long distance travels, recent surgeries, recent hospitalizations, h/o blood clots, h/o cancer. She states a bath with epsom salt alleviated the swelling and hydrocodone has provided some relief of the pain. Pt is ambulatory with discomfort. She denies any other symptoms or complaints at this time.   Past Medical History:  Diagnosis Date  . ADHD (attention deficit hyperactivity disorder)   . Asthma   . Fibroid tumor   . Hypertension   . Kidney stones     Patient Active Problem List   Diagnosis Date Noted  . Fibroid uterus 01/01/2016  . Menorrhagia 01/01/2016  . ONYCHOMYCOSIS 02/26/2007  . OBESITY, NOS 02/26/2007  . ANEMIA, OTHER, UNSPECIFIED 02/26/2007  . ASTHMA, UNSPECIFIED 02/26/2007    Past Surgical History:  Procedure Laterality Date  . LEG SURGERY    . REPLACEMENT TOTAL KNEE Left 2008    OB History    Gravida Para Term Preterm AB Living   5 3 3   2  3    SAB TAB Ectopic Multiple Live Births   1               Home Medications    Prior to Admission medications   Medication Sig Start Date End Date Taking? Authorizing Provider  albuterol (PROVENTIL HFA;VENTOLIN HFA) 108 (90 Base) MCG/ACT inhaler Inhale 2 puffs into the lungs every 4 (four) hours as needed for wheezing or shortness of breath. 10/14/16   Delsa Grana, PA-C  amphetamine-dextroamphetamine (ADDERALL XR) 20 MG 24 hr capsule Take 20 mg by mouth daily.    Historical Provider, MD  cyclobenzaprine (FLEXERIL) 5 MG tablet Take 1 tablet (5 mg total) by mouth 3 (three) times daily as needed for muscle spasms. Patient not taking: Reported on 10/13/2016 10/26/15   Serita Grit, MD  ferrous sulfate 325 (65 FE) MG tablet Take 1 tablet (325 mg total) by mouth daily with breakfast. Patient taking differently: Take 325 mg by mouth at bedtime.  10/11/16   Tanna Furry, MD  guaiFENesin-codeine 100-10 MG/5ML syrup Take 5 mLs by mouth 3 (three) times daily as needed for cough. 10/14/16   Delsa Grana, PA-C  hydrochlorothiazide (HYDRODIURIL) 25 MG tablet Take 25 mg by mouth daily.    Historical Provider, MD  HYDROcodone-acetaminophen (NORCO) 10-325 MG tablet Take 1 tablet by mouth every 6 (six) hours as needed for moderate pain.    Historical Provider, MD  ibuprofen (ADVIL,MOTRIN) 800 MG tablet Take 800 mg  by mouth every 8 (eight) hours as needed. Patient used this medication for headaches and leg pain.    Historical Provider, MD  metoprolol tartrate (LOPRESSOR) 25 MG tablet Take 25 mg by mouth daily. Reported on 01/01/2016    Historical Provider, MD  Multiple Vitamin (MULTIVITAMIN WITH MINERALS) TABS tablet Take 1 tablet by mouth daily.    Historical Provider, MD  naproxen (NAPROSYN) 500 MG tablet Take 1 tablet (500 mg total) by mouth 2 (two) times daily. 10/27/16   Roxanna Mew, PA-C  ondansetron (ZOFRAN ODT) 4 MG disintegrating tablet Take 1 tablet (4 mg total) by mouth every 8 (eight) hours as  needed for nausea or vomiting. 10/14/16   Delsa Grana, PA-C  oxyCODONE-acetaminophen (PERCOCET) 5-325 MG tablet Take 1 tablet by mouth every 4 (four) hours as needed. 10/14/16   Delsa Grana, PA-C  potassium chloride (K-DUR) 10 MEQ tablet Take 1 tablet (10 mEq total) by mouth 2 (two) times daily. 10/14/16 10/17/16  Delsa Grana, PA-C  predniSONE (DELTASONE) 20 MG tablet Take 2 tablets (40 mg total) by mouth daily. 10/14/16   Delsa Grana, PA-C  traMADol (ULTRAM) 50 MG tablet Take 1 tablet (50 mg total) by mouth every 6 (six) hours as needed. 05/22/16   Gwen Pounds, CNM  valACYclovir (VALTREX) 500 MG tablet Take 500 mg by mouth daily. 12/27/15   Historical Provider, MD  zolpidem (AMBIEN) 10 MG tablet Take 10 mg by mouth at bedtime.  11/20/15   Historical Provider, MD    Family History History reviewed. No pertinent family history.  Social History Social History  Substance Use Topics  . Smoking status: Never Smoker  . Smokeless tobacco: Never Used  . Alcohol use No     Allergies   Review of patient's allergies indicates no known allergies.   Review of Systems Review of Systems  Constitutional: Negative for fever.  HENT: Negative for trouble swallowing.   Eyes: Negative for visual disturbance.  Respiratory: Negative for cough and shortness of breath.   Cardiovascular: Positive for leg swelling. Negative for chest pain.  Gastrointestinal: Negative for abdominal pain and vomiting.  Musculoskeletal: Positive for arthralgias and myalgias.  Skin: Negative for color change.  Allergic/Immunologic: Negative for immunocompromised state.  Neurological: Negative for weakness and numbness.     Physical Exam Updated Vital Signs BP 133/76   Pulse 89   Temp 98.5 F (36.9 C) (Oral)   Resp 17   Ht 5\' 9"  (1.753 m)   Wt 107 kg   LMP 10/27/2016   SpO2 100%   BMI 34.85 kg/m   Physical Exam  Constitutional: She appears well-developed and well-nourished. No distress.  HENT:  Head:  Normocephalic and atraumatic.  Mouth/Throat: Oropharynx is clear and moist. No oropharyngeal exudate.  Eyes: Conjunctivae and EOM are normal. Pupils are equal, round, and reactive to light. Right eye exhibits no discharge. Left eye exhibits no discharge. No scleral icterus.  Neck: Normal range of motion and phonation normal. Neck supple. No neck rigidity. Normal range of motion present.  Cardiovascular: Normal rate, regular rhythm, normal heart sounds and intact distal pulses.   No murmur heard. Pulmonary/Chest: Effort normal and breath sounds normal. No stridor. No respiratory distress. She has no wheezes. She has no rales.  Abdominal: Soft. Bowel sounds are normal. She exhibits no distension. There is no tenderness. There is no rigidity, no rebound, no guarding and no CVA tenderness.  Musculoskeletal: Normal range of motion. She exhibits edema and tenderness.  Left ankle: She exhibits swelling. Tenderness.  Left ankle: Swelling to L foot and ankle. TTP of the medial malleolus with palpable knot, no area of fluctuance. TTP of dorsal aspect of foot. No warmth, erythema, or discoloration appreciated. Patient able to move toes. Sensation intact. DP pulses intact. Capillary refill <3seconds.  and neurovascularly intact.  Lymphadenopathy:    She has no cervical adenopathy.  Neurological: She is alert. She is not disoriented. Coordination and gait normal. GCS eye subscore is 4. GCS verbal subscore is 5. GCS motor subscore is 6.  Skin: Skin is warm and dry. She is not diaphoretic.  Psychiatric: She has a normal mood and affect. Her behavior is normal.  Nursing note and vitals reviewed.    ED Treatments / Results  DIAGNOSTIC STUDIES:  Oxygen Saturation is 100% on RA, normal by my interpretation.    COORDINATION OF CARE:  4:55 PM Discussed treatment plan with pt at bedside and pt agreed to plan.  Labs (all labs ordered are listed, but only abnormal results are displayed) Labs Reviewed -  No data to display  EKG  EKG Interpretation None       Radiology Dg Ankle Complete Left  Result Date: 10/27/2016 CLINICAL DATA:  Pain and swelling with no known trauma. EXAM: LEFT ANKLE COMPLETE - 3+ VIEW COMPARISON:  None. FINDINGS: There is no evidence of fracture, dislocation, or joint effusion. There is no evidence of arthropathy or other focal bone abnormality. Soft tissues are unremarkable. IMPRESSION: Negative. Electronically Signed   By: Dorise Bullion III M.D   On: 10/27/2016 17:50   US Venous Img Lower Unilateral Left  Result Date: 10/27/2016 CLINICAL DATA:  Acute onset of left leg swelling for 4 days. Initial encounter. EXAM: LEFT LOWER EXTREMITY VENOUS DOPPLER ULTRASOUND TECHNIQUE: Gray-scale sonography with graded compression, as well as color Doppler and duplex ultrasound were performed to evaluate the lower extremity deep venous systems from the level of the common femoral vein and including the common femoral, femoral, profunda femoral, popliteal and calf veins including the posterior tibial, peroneal and gastrocnemius veins when visible. The superficial great saphenous vein was also interrogated. Spectral Doppler was utilized to evaluate flow at rest and with distal augmentation maneuvers in the common femoral, femoral and popliteal veins. COMPARISON:  None. FINDINGS: Contralateral Common Femoral Vein: Respiratory phasicity is normal and symmetric with the symptomatic side. No evidence of thrombus. Normal compressibility. Common Femoral Vein: No evidence of thrombus. Normal compressibility, respiratory phasicity and response to augmentation. Saphenofemoral Junction: No evidence of thrombus. Normal compressibility and flow on color Doppler imaging. Profunda Femoral Vein: No evidence of thrombus. Normal compressibility and flow on color Doppler imaging. Femoral Vein: No evidence of thrombus. Normal compressibility, respiratory phasicity and response to augmentation. Popliteal Vein:  No evidence of thrombus. Normal compressibility, respiratory phasicity and response to augmentation. Calf Veins: No evidence of thrombus. Normal compressibility and flow on color Doppler imaging. Superficial Great Saphenous Vein: No evidence of thrombus. Normal compressibility and flow on color Doppler imaging. Venous Reflux:  None. Other Findings:  None. IMPRESSION: No evidence of deep venous thrombosis. Electronically Signed   By: Garald Balding M.D.   On: 10/27/2016 18:28   Dg Foot Complete Left  Result Date: 10/27/2016 CLINICAL DATA:  Medial ankle pain and swelling with no known trauma. Pain near the base of the toes. EXAM: LEFT FOOT - COMPLETE 3+ VIEW COMPARISON:  None. FINDINGS: There is no evidence of fracture or dislocation. There is no evidence of arthropathy or other  focal bone abnormality. Soft tissues are unremarkable. IMPRESSION: Negative. Electronically Signed   By: Dorise Bullion III M.D   On: 10/27/2016 17:49    Procedures Procedures (including critical care time)  Medications Ordered in ED Medications  HYDROcodone-acetaminophen (NORCO/VICODIN) 5-325 MG per tablet 1 tablet (1 tablet Oral Given 10/27/16 1808)  HYDROcodone-acetaminophen (NORCO/VICODIN) 5-325 MG per tablet 1 tablet (1 tablet Oral Given 10/27/16 2019)     Initial Impression / Assessment and Plan / ED Course  I have reviewed the triage vital signs and the nursing notes.  Pertinent labs & imaging results that were available during my care of the patient were reviewed by me and considered in my medical decision making (see chart for details).  Clinical Course  Value Comment By Time  DG Ankle Complete Left No obvious fracture or dislocation.  Roxanna Mew, [place] 10/29 1810  DG Foot Complete Left No obvious fracture or dislocation.  Roxanna Mew, [place] 10/29 1810  US Venous Img Lower Unilateral Left Reviewed Roxanna Mew, PA-C 10/30 [id number]    Patient presents to ED with complaint of left  ankle/foot swelling and pain x 4 days. Patient is afebrile and non-toxic appearing in NAD. VSS.  Swelling to ankle and foot appreciated with palpable knot to above medial malleolus. No area of fluctuance. No warmth, erythema or discoloration. TTP. Neurovascularly intact. No TTP of posterior calf. Patient is afebrile, low suspicion for cellulitis or abscess. Pain managed in ED.  Patient X-Ray negative for obvious fracture or dislocation. DVT study negative. Pain may be secondary to ?MSK inflammation vs. ?reflex sympathetic dystrophy secondary to prior injuries to left lower extremity. Discussed results and plan with patient. Patient given ACE wrap while in ED, conservative therapy recommended and discussed. Rx naprosyn. Follow up with PCP for re-evaluation and further management, resources provided for establishing PCP. Return precautions discussed. Patient will be discharged home & is agreeable with above plan. Pt appears safe for discharge.   Final Clinical Impressions(s) / ED Diagnoses   Final diagnoses:  Foot pain, left    New Prescriptions Discharge Medication List as of 10/27/2016  8:08 PM    START taking these medications   Details  naproxen (NAPROSYN) 500 MG tablet Take 1 tablet (500 mg total) by mouth 2 (two) times daily., Starting Sun 10/27/2016, Print      I personally performed the services described in this documentation, which was scribed in my presence. The recorded information has been reviewed and is accurate.     Roxanna Mew, [place] 10/28/16 [id number]    Duffy Bruce, MD 10/28/16 [id number]

## 2016-10-27 NOTE — ED Notes (Signed)
Pt in [place]

## 2016-11-06 ENCOUNTER — Emergency Department (HOSPITAL_COMMUNITY): Payer: Medicaid Other

## 2016-11-06 ENCOUNTER — Encounter (HOSPITAL_COMMUNITY): Payer: Self-pay | Admitting: Emergency Medicine

## 2016-11-06 ENCOUNTER — Emergency Department (HOSPITAL_COMMUNITY)
Admission: EM | Admit: 2016-11-06 | Discharge: 2016-11-06 | Disposition: A | Discharge status: Home / Self Care | Payer: Medicaid Other | Attending: Emergency Medicine | Admitting: Emergency Medicine

## 2016-11-06 DIAGNOSIS — R05 Cough: Secondary | ICD-10-CM | POA: Diagnosis present

## 2016-11-06 DIAGNOSIS — J4541 Moderate persistent asthma with (acute) exacerbation: Secondary | ICD-10-CM | POA: Diagnosis not present

## 2016-11-06 DIAGNOSIS — Z79899 Other long term (current) drug therapy: Secondary | ICD-10-CM | POA: Insufficient documentation

## 2016-11-06 DIAGNOSIS — I10 Essential (primary) hypertension: Secondary | ICD-10-CM | POA: Diagnosis not present

## 2016-11-06 DIAGNOSIS — J069 Acute upper respiratory infection, unspecified: Secondary | ICD-10-CM | POA: Diagnosis not present

## 2016-11-06 DIAGNOSIS — F909 Attention-deficit hyperactivity disorder, unspecified type: Secondary | ICD-10-CM | POA: Insufficient documentation

## 2016-11-06 DIAGNOSIS — Z96652 Presence of left artificial knee joint: Secondary | ICD-10-CM | POA: Insufficient documentation

## 2016-11-06 MED ORDER — IPRATROPIUM-ALBUTEROL 0.5-2.5 (3) MG/3ML IN SOLN
3.0000 mL | Freq: Once | RESPIRATORY_TRACT | Status: AC
  Administered 2016-11-06: 3 mL via RESPIRATORY_TRACT
  Filled 2016-11-06: qty 3

## 2016-11-06 MED ORDER — SALINE SPRAY 0.65 % NA SOLN
1.0000 | NASAL | 0 refills | Status: DC | PRN
Start: 2016-11-06 — End: 2017-03-19

## 2016-11-06 MED ORDER — ALBUTEROL SULFATE HFA 108 (90 BASE) MCG/ACT IN AERS: 2.0000 | INHALATION_SPRAY | RESPIRATORY_TRACT | 0 refills | Status: DC | PRN

## 2016-11-06 MED ORDER — KETOROLAC TROMETHAMINE 30 MG/ML IJ SOLN
30.0000 mg | Freq: Once | INTRAMUSCULAR | Status: AC
  Administered 2016-11-06: 30 mg via INTRAMUSCULAR
  Filled 2016-11-06: qty 1

## 2016-11-06 MED ORDER — DEXAMETHASONE SODIUM PHOSPHATE 10 MG/ML IJ SOLN
10.0000 mg | Freq: Once | INTRAMUSCULAR | Status: AC
  Administered 2016-11-06: 10 mg via INTRAMUSCULAR
  Filled 2016-11-06: qty 1

## 2016-11-06 MED ORDER — PREDNISONE 20 MG PO TABS: 40.0000 mg | ORAL_TABLET | Freq: Every day | ORAL | 0 refills | Status: DC

## 2016-11-06 MED ORDER — ONDANSETRON 4 MG PO TBDP
4.0000 mg | ORAL_TABLET | Freq: Once | ORAL | Status: AC
  Administered 2016-11-06: 4 mg via ORAL
  Filled 2016-11-06: qty 1

## 2016-11-06 MED ORDER — FLUTICASONE PROPIONATE 50 MCG/ACT NA SUSP: 2.0000 | Freq: Every day | NASAL | 0 refills | Status: DC

## 2016-11-06 MED ORDER — ALBUTEROL SULFATE HFA 108 (90 BASE) MCG/ACT IN AERS
2.0000 | INHALATION_SPRAY | RESPIRATORY_TRACT | Status: DC | PRN
  Filled 2016-11-06: qty 6.7

## 2016-11-06 NOTE — ED Triage Notes (Signed)
Pt brought in by EMS with congestion   Pt states she has been sick for a while then was improving and got sick again  Pt is c/o cough, congestion, headache, and pain in her lower back  Pt states she has been using her inhaler, delsym, and another cough medications she was given but continues to feel bad  EMS gave one duoneb in route and said pt had some wheezing noted in her right upper lobe

## 2016-11-06 NOTE — ED Provider Notes (Signed)
[facility] DEPT Provider Note   CSN: [id number] Arrival date & time: 11/06/16  0111  By signing my name below, I, Gwenlyn Fudge, attest that this documentation has been prepared under the direction and in the presence of Merryl Hacker, MD. Electronically Signed: Gwenlyn Fudge, ED Scribe. 11/06/16. 1:53 AM.   History   Chief Complaint Chief Complaint  Patient presents with  . URI   The history is provided by the patient. No language interpreter was used.   HPI Comments: Kristen Swanson is a 40 y.o. female with PMHx of Asthma and HTN who presents to the Emergency Department complaining of gradual onset, persistent cough onset 3 days PTA. 2 days ago, she began producing green phlegm. She states her asthma is usually exacerbated when she gets sick. Pt took cough syrup and used inhaler with no relief to symptoms. Pt reports associated lower back pain, headache worse with coughing, and congestion. When she coughs, she feels as if her "brain is being squeezed".  She states her son was recently sick. Pt denies nausea, vomiting, acute abdominal pain and fever.  Past Medical History:  Diagnosis Date  . ADHD (attention deficit hyperactivity disorder)   . Asthma   . Fibroid tumor   . Hypertension   . Kidney stones     Patient Active Problem List   Diagnosis Date Noted  . Fibroid uterus 01/01/2016  . Menorrhagia 01/01/2016  . ONYCHOMYCOSIS 02/26/2007  . OBESITY, NOS 02/26/2007  . ANEMIA, OTHER, UNSPECIFIED 02/26/2007  . ASTHMA, UNSPECIFIED 02/26/2007    Past Surgical History:  Procedure Laterality Date  . LEG SURGERY    . REPLACEMENT TOTAL KNEE Left 2008    OB History    Gravida Para Term Preterm AB Living   5 3 3   2 3    SAB TAB Ectopic Multiple Live Births   1               Home Medications    Prior to Admission medications   Medication Sig Start Date End Date Taking? Authorizing Provider  albuterol (PROVENTIL HFA;VENTOLIN HFA) 108 (90 Base) MCG/ACT inhaler Inhale 2  puffs into the lungs every 4 (four) hours as needed for wheezing or shortness of breath. 10/14/16   Delsa Grana, PA-C  albuterol (PROVENTIL HFA;VENTOLIN HFA) 108 (90 Base) MCG/ACT inhaler Inhale 2 puffs into the lungs every 4 (four) hours as needed for wheezing or shortness of breath. 11/06/16   Merryl Hacker, MD  amphetamine-dextroamphetamine (ADDERALL XR) 20 MG 24 hr capsule Take 20 mg by mouth daily.    Historical Provider, MD  cyclobenzaprine (FLEXERIL) 5 MG tablet Take 1 tablet (5 mg total) by mouth 3 (three) times daily as needed for muscle spasms. Patient not taking: Reported on 10/13/2016 10/26/15   Serita Grit, MD  ferrous sulfate 325 (65 FE) MG tablet Take 1 tablet (325 mg total) by mouth daily with breakfast. Patient taking differently: Take 325 mg by mouth at bedtime.  10/11/16   Tanna Furry, MD  fluticasone (FLONASE) 50 MCG/ACT nasal spray Place 2 sprays into both nostrils daily. 11/06/16   Merryl Hacker, MD  guaiFENesin-codeine 100-10 MG/5ML syrup Take 5 mLs by mouth 3 (three) times daily as needed for cough. 10/14/16   Delsa Grana, PA-C  hydrochlorothiazide (HYDRODIURIL) 25 MG tablet Take 25 mg by mouth daily.    Historical Provider, MD  HYDROcodone-acetaminophen (NORCO) 10-325 MG tablet Take 1 tablet by mouth every 6 (six) hours as needed for moderate pain.  Historical Provider, MD  ibuprofen (ADVIL,MOTRIN) 800 MG tablet Take 800 mg by mouth every 8 (eight) hours as needed. Patient used this medication for headaches and leg pain.    Historical Provider, MD  metoprolol tartrate (LOPRESSOR) 25 MG tablet Take 25 mg by mouth daily. Reported on 01/01/2016    Historical Provider, MD  Multiple Vitamin (MULTIVITAMIN WITH MINERALS) TABS tablet Take 1 tablet by mouth daily.    Historical Provider, MD  naproxen (NAPROSYN) 500 MG tablet Take 1 tablet (500 mg total) by mouth 2 (two) times daily. 10/27/16   Roxanna Mew, PA-C  ondansetron (ZOFRAN ODT) 4 MG disintegrating tablet Take 1  tablet (4 mg total) by mouth every 8 (eight) hours as needed for nausea or vomiting. 10/14/16   Delsa Grana, PA-C  oxyCODONE-acetaminophen (PERCOCET) 5-325 MG tablet Take 1 tablet by mouth every 4 (four) hours as needed. 10/14/16   Delsa Grana, PA-C  potassium chloride (K-DUR) 10 MEQ tablet Take 1 tablet (10 mEq total) by mouth 2 (two) times daily. 10/14/16 10/17/16  Delsa Grana, PA-C  predniSONE (DELTASONE) 20 MG tablet Take 2 tablets (40 mg total) by mouth daily. 11/06/16   Merryl Hacker, MD  sodium chloride (OCEAN) 0.65 % SOLN nasal spray Place 1 spray into both nostrils as needed for congestion. 11/06/16   Merryl Hacker, MD  traMADol (ULTRAM) 50 MG tablet Take 1 tablet (50 mg total) by mouth every 6 (six) hours as needed. 05/22/16   Gwen Pounds, CNM  valACYclovir (VALTREX) 500 MG tablet Take 500 mg by mouth daily. 12/27/15   Historical Provider, MD  zolpidem (AMBIEN) 10 MG tablet Take 10 mg by mouth at bedtime.  11/20/15   Historical Provider, MD    Family History History reviewed. No pertinent family history.  Social History Social History  Substance Use Topics  . Smoking status: Never Smoker  . Smokeless tobacco: Never Used  . Alcohol use No     Allergies   Patient has no known allergies.   Review of Systems Review of Systems  Constitutional: Negative for fever.  HENT: Positive for congestion and sinus pressure. Negative for sore throat.   Respiratory: Positive for cough.   Gastrointestinal: Negative for abdominal pain, nausea and vomiting.  Musculoskeletal: Positive for back pain.  Neurological: Positive for headaches.  All other systems reviewed and are negative.    Physical Exam Updated Vital Signs BP 134/79 (BP Location: Left Arm)   Pulse 105   Temp 98.4 F (36.9 C) (Oral)   Resp 20   LMP 10/27/2016   SpO2 100%   Physical Exam  Constitutional: She is oriented to person, place, and time. She appears well-developed and well-nourished.  HENT:  Head:  Normocephalic and atraumatic.  Nose: Nose normal.  Congestion noted  Eyes: Pupils are equal, round, and reactive to light.  Neck: Neck supple.  Cardiovascular: Normal rate, regular rhythm and normal heart sounds.   No murmur heard. Pulmonary/Chest: Effort normal. No respiratory distress. She has wheezes.  Abdominal: Soft. Bowel sounds are normal. There is no tenderness. There is no guarding.  Neurological: She is alert and oriented to person, place, and time.  Skin: Skin is warm and dry.  Psychiatric: She has a normal mood and affect.  Nursing note and vitals reviewed.    ED Treatments / Results  DIAGNOSTIC STUDIES: Oxygen Saturation is 99% on RA, normal by my interpretation.    COORDINATION OF CARE: 1:52 AM Discussed treatment plan with pt at bedside which  includes DG Chest and pt agreed to plan.  Labs (all labs ordered are listed, but only abnormal results are displayed) Labs Reviewed - No data to display  EKG  EKG Interpretation None       Radiology Dg Chest 2 View  Result Date: 11/06/2016 CLINICAL DATA:  Asthma, productive cough EXAM: CHEST  2 VIEW COMPARISON:  Chest radiograph 10/13/2016 FINDINGS: Cardiomediastinal contours are normal. No pneumothorax or pleural effusion. No focal airspace consolidation or pulmonary edema. IMPRESSION: Clear lungs. Electronically Signed   By: Ulyses Jarred M.D.   On: 11/06/2016 02:30    Procedures Procedures (including critical care time)  Medications Ordered in ED Medications  albuterol (PROVENTIL HFA;VENTOLIN HFA) 108 (90 Base) MCG/ACT inhaler 2 puff (not administered)  ipratropium-albuterol (DUONEB) 0.5-2.5 (3) MG/3ML nebulizer solution 3 mL (3 mLs Nebulization Given 11/06/16 0216)  dexamethasone (DECADRON) injection 10 mg (10 mg Intramuscular Given 11/06/16 0211)  ondansetron (ZOFRAN-ODT) disintegrating tablet 4 mg (4 mg Oral Given 11/06/16 0241)  ketorolac (TORADOL) 30 MG/ML injection 30 mg (30 mg Intramuscular Given 11/06/16  0347)     Initial Impression / Assessment and Plan / ED Course  I have reviewed the triage vital signs and the nursing notes.  Pertinent labs & imaging results that were available during my care of the patient were reviewed by me and considered in my medical decision making (see chart for details).  Clinical Course    Patient presents with upper respiratory symptoms. Found to be wheezing. Reports persistent cough which is keeping her from sleeping. Also reports headache and congestion. No fevers at home. Afebrile here. Vital signs notable for mild tachycardia following DuoNeb. She is wheezing. Suspect viral upper respiratory infection and asthma exacerbation. Chest x-ray shows no evidence of pneumonia. Patient given Decadron and DuoNeb as well as Toradol for her headache. Reports some improvement of her symptoms. She is concerned regarding her persistent cough. I suspect once steroids take effect, this will likely help with this significantly. No indication for antibiotics at this time. Flonase and nasal saline for congestion and sinus pressure. Follow-up with PCP in 2-3 days if not improving.  After history, exam, and medical workup I feel the patient has been appropriately medically screened and is safe for discharge home. Pertinent diagnoses were discussed with the patient. Patient was given return precautions.   Final Clinical Impressions(s) / ED Diagnoses   Final diagnoses:  Viral upper respiratory tract infection  Moderate persistent asthma with acute exacerbation    New Prescriptions New Prescriptions   ALBUTEROL (PROVENTIL HFA;VENTOLIN HFA) 108 (90 BASE) MCG/ACT INHALER    Inhale 2 puffs into the lungs every 4 (four) hours as needed for wheezing or shortness of breath.   FLUTICASONE (FLONASE) 50 MCG/ACT NASAL SPRAY    Place 2 sprays into both nostrils daily.   PREDNISONE (DELTASONE) 20 MG TABLET    Take 2 tablets (40 mg total) by mouth daily.   SODIUM CHLORIDE (OCEAN) 0.65 % SOLN  NASAL SPRAY    Place 1 spray into both nostrils as needed for congestion.     Merryl Hacker, MD 11/06/16 [phone]

## 2016-11-06 NOTE — ED Notes (Signed)
Patient stated she has albuterol inhaler and does not need another. MD notified that patient is requesting an antibiotic.

## 2016-11-06 NOTE — ED Notes (Signed)
Pt ambulated 50 feet.  Pt did not show signs of distress.  Pt's pulse rate remained around 113.  Pt 's O2 level remained at 99 during ambulation.  Pt complained of headache.

## 2016-11-07 ENCOUNTER — Ambulatory Visit (INDEPENDENT_AMBULATORY_CARE_PROVIDER_SITE_OTHER): Payer: Medicare Other | Admitting: Obstetrics and Gynecology

## 2016-11-07 ENCOUNTER — Encounter: Payer: Self-pay | Admitting: Obstetrics and Gynecology

## 2016-11-07 ENCOUNTER — Ambulatory Visit (INDEPENDENT_AMBULATORY_CARE_PROVIDER_SITE_OTHER): Payer: Medicare Other | Admitting: Clinical

## 2016-11-07 VITALS — BP 125/81 | HR 83 | Ht 69.0 in | Wt 253.2 lb

## 2016-11-07 DIAGNOSIS — D251 Intramural leiomyoma of uterus: Secondary | ICD-10-CM | POA: Diagnosis present

## 2016-11-07 DIAGNOSIS — N920 Excessive and frequent menstruation with regular cycle: Secondary | ICD-10-CM

## 2016-11-07 DIAGNOSIS — D500 Iron deficiency anemia secondary to blood loss (chronic): Secondary | ICD-10-CM

## 2016-11-07 DIAGNOSIS — F4323 Adjustment disorder with mixed anxiety and depressed mood: Secondary | ICD-10-CM

## 2016-11-07 NOTE — Progress Notes (Signed)
Kristen Swanson is an 40 y.o. female who presents to clinic for further eval of uterine fibroids and heavy cycles. Pt has been seen before for these problems. U/S of 9/16 demonstrated multiple uterine fibroids ranging in size from 1-3 cm.  She continues to have heavy cycles, last 7 days, changes pads/tampoons q1hr on heaviest days plus cramps.  She H/O anemia with blood transfusion in the past. Last H/H [phone] a month ago.  H/O HTN, stable.   TSVD x 3 largest over 7 #   Menstrual History: Menarche age: 21 Patient's last menstrual period was 10/27/2016 (exact date).    Past Medical History:  Diagnosis Date  . ADHD (attention deficit hyperactivity disorder)   . Asthma   . Fibroid tumor   . Hypertension   . Kidney stones     Past Surgical History:  Procedure Laterality Date  . LEG SURGERY    . REPLACEMENT TOTAL KNEE Left 2008    No family history on file.  Social History:  reports that she has never smoked. She has never used smokeless tobacco. She reports that she does not drink alcohol or use drugs.  Allergies: No Known Allergies   (Not in a hospital admission)  Review of Systems  Constitutional: Negative.   Respiratory: Negative.   Cardiovascular: Negative.   Gastrointestinal: Negative.   Genitourinary: Negative.     Blood pressure 125/81, pulse 83, height 5\' 9"  (1.753 m), weight 253 lb 3.2 oz (114.9 kg), last menstrual period 10/27/2016. Physical Exam  Constitutional: She appears well-developed and well-nourished.  Cardiovascular: Normal rate and regular rhythm.   Respiratory: Effort normal and breath sounds normal.  GI: Soft. Bowel sounds are normal.  Genitourinary:  Genitourinary Comments: Nl EGBUS, cervix no lesion, uterus 8-10 week size, mobile, non tender, no adnexa masses     No results found for this or any previous visit (from the past 24 hour(s)).  Dg Chest 2 View  Result Date: 11/06/2016 CLINICAL DATA:  Asthma, productive cough EXAM: CHEST  2 VIEW  COMPARISON:  Chest radiograph 10/13/2016 FINDINGS: Cardiomediastinal contours are normal. No pneumothorax or pleural effusion. No focal airspace consolidation or pulmonary edema. IMPRESSION: Clear lungs. Electronically Signed   By: Ulyses Jarred M.D.   On: 11/06/2016 02:30    Assessment/Plan:  Uterine Fibroids Menorrhagia Dysmenorrhea Anemia  Discussed treatment options with pt. Pt desires definite treatment. TVH with BS recommended to pt. Procedure reviewed. R/B/Post op care discussed. Will check GYN U/S. If not significant changes will schedule 7-10 days after next cycle.   Chancy Milroy 11/07/2016, 3:21 PM

## 2016-11-07 NOTE — Patient Instructions (Signed)
Hysterectomy Information   A hysterectomy is a surgery in which your uterus is removed. This surgery may be done to treat various medical problems. After the surgery, you will no longer have menstrual periods. The surgery will also make you unable to become pregnant (sterile). The fallopian tubes and ovaries can be removed (bilateral salpingo-oophorectomy) during this surgery as well.   REASONS FOR A HYSTERECTOMY  · Persistent, abnormal bleeding.  · Lasting (chronic) pelvic pain or infection.  · The lining of the uterus (endometrium) starts growing outside the uterus (endometriosis).  · The endometrium starts growing in the muscle of the uterus (adenomyosis).  · The uterus falls down into the vagina (pelvic organ prolapse).  · Noncancerous growths in the uterus (uterine fibroids) that cause symptoms.  · Precancerous cells.  · Cervical cancer or uterine cancer.  TYPES OF HYSTERECTOMIES  · Supracervical hysterectomy--In this type, the top part of the uterus is removed, but not the cervix.  · Total hysterectomy--The uterus and cervix are removed.  · Radical hysterectomy--The uterus, the cervix, and the fibrous tissue that holds the uterus in place in the pelvis (parametrium) are removed.  WAYS A HYSTERECTOMY CAN BE PERFORMED  · Abdominal hysterectomy--A large surgical cut (incision) is made in the abdomen. The uterus is removed through this incision.  · Vaginal hysterectomy--An incision is made in the vagina. The uterus is removed through this incision. There are no abdominal incisions.  · Conventional laparoscopic hysterectomy--Three or four small incisions are made in the abdomen. A thin, lighted tube with a camera (laparoscope) is inserted into one of the incisions. Other tools are put through the other incisions. The uterus is cut into small pieces. The small pieces are removed through the incisions, or they are removed through the vagina.  · Laparoscopically assisted vaginal hysterectomy (LAVH)--Three or four  small incisions are made in the abdomen. Part of the surgery is performed laparoscopically and part vaginally. The uterus is removed through the vagina.  · Robot-assisted laparoscopic hysterectomy--A laparoscope and other tools are inserted into 3 or 4 small incisions in the abdomen. A computer-controlled device is used to give the surgeon a 3D image and to help control the surgical instruments. This allows for more precise movements of surgical instruments. The uterus is cut into small pieces and removed through the incisions or removed through the vagina.  RISKS AND COMPLICATIONS   Possible complications associated with this procedure include:  · Bleeding and risk of blood transfusion. Tell your health care provider if you do not want to receive any blood products.  · Blood clots in the legs or lung.  · Infection.  · Injury to surrounding organs.  · Problems or side effects related to anesthesia.  · Conversion to an abdominal hysterectomy from one of the other techniques.  WHAT TO EXPECT AFTER A HYSTERECTOMY  · You will be given pain medicine.  · You will need to have someone with you for the first 3-5 days after you go home.  · You will need to follow up with your surgeon in 2-4 weeks after surgery to evaluate your progress.  · You may have early menopause symptoms such as hot flashes, night sweats, and insomnia.  · If you had a hysterectomy for a problem that was not cancer or not a condition that could lead to cancer, then you no longer need Pap tests. However, even if you no longer need a Pap test, a regular exam is a good idea to make sure no   other problems are starting.     This information is not intended to replace advice given to you by your health care provider. Make sure you discuss any questions you have with your health care provider.     Document Released: 06/11/2001 Document Revised: 10/06/2013 Document Reviewed: 08/23/2013  Elsevier Interactive Patient Education ©2016 [employer]

## 2016-11-07 NOTE — BH Specialist Note (Signed)
Session Start time: 2:50   End Time: 3:50 Total Time:  60 minutes Type of Service: [place]: No.   Interpreter Name & Language: n/a # [facility] Visits July 2017-June 2018: 1st   SUBJECTIVE: Kristen Swanson is a 40 y.o. female  Pt. was referred by Arlina Robes, MD for:  anxiety and depression. Pt. reports the following symptoms/concerns: Pt states that her primary symptoms  are feeling overwhelmingly tired and being unable to sleep well, as well as needing to be able to talk aloud about her daily stressors. Pt is feeling conflicted over upcoming surgery, and possible hormonal changes/menopause that may result. Duration of problem: Increase in past month Severity: moderate Previous treatment: Individual counseling several years ago was helpful   OBJECTIVE: Mood: Appropriate & Affect: Appropriate Risk of harm to self or others: No known risk of harm to self or others Assessments administered: PHQ9: 12/ GAD7: 12  LIFE CONTEXT:  Family & Social: Lives with 6yo daughter and 21yo son; active member of supportive church family  School/ Work: Disability Self-Care: Low potassium and anemia, along with sleep issues causing her to consider greater need for self-care; prayer and church helps Life changes: Fibroid diagnosis, upcoming hysterectomy What is important to pt/family (values): Family, Health, Finding love   GOALS ADDRESSED:  -Alleviate symptoms of anxiety and depression  INTERVENTIONS: Solution Focused, Strength-based and Supportive   ASSESSMENT:  Pt currently experiencing Adjustment disorder with mixed anxious and depressed mood.  Pt may benefit from psychoeducation and brief therapeutic intervention regarding coping with symptoms of anxiety and depression.      PLAN: 1. F/U with behavioral health clinician: As needed 2. Behavioral Health meds: none 3. Behavioral recommendations:  -Take iron as prescribed, as well as multivitamins, as long as  medical provider recommends -Consider [facility] for individual counseling again, as needed -Read educational material regarding coping with symptoms of anxiety and depression(as well as sleep suggestions) -Consider apps for additional self-care strategy -Consider [facility] for group classes and workshops -Continue to stay active in church family 4. Referral: Brief Counseling/Psychotherapy, Data processing manager and Psychoeducation 5. From scale of 1-10, how likely are you to follow plan: [place]:   Warm Hand Off Completed.        Depression screen PHQ 2/9 11/07/2016  Decreased Interest 1  Down, Depressed, Hopeless 1  PHQ - 2 Score 2  Altered sleeping 3  Tired, decreased energy 3  Change in appetite 1  Feeling bad or failure about yourself  1  Trouble concentrating 2  Moving slowly or fidgety/restless 0  Suicidal thoughts 0  PHQ-9 Score 12   GAD 7 : Generalized Anxiety Score 11/07/2016  Nervous, Anxious, on Edge 1  Control/stop worrying 1  Worry too much - different things 1  Trouble relaxing 1  Restless 2  Easily annoyed or irritable 3  Afraid - awful might happen 3  Total GAD 7 Score 12

## 2016-11-12 ENCOUNTER — Other Ambulatory Visit: Payer: Self-pay | Admitting: *Deleted

## 2016-11-12 DIAGNOSIS — D250 Submucous leiomyoma of uterus: Secondary | ICD-10-CM

## 2016-11-13 ENCOUNTER — Encounter (HOSPITAL_COMMUNITY): Payer: Self-pay

## 2016-11-13 ENCOUNTER — Ambulatory Visit (HOSPITAL_COMMUNITY): Admission: RE | Admit: 2016-11-13 | Payer: Medicaid Other | Source: Ambulatory Visit

## 2017-01-03 DIAGNOSIS — I10 Essential (primary) hypertension: Secondary | ICD-10-CM | POA: Diagnosis not present

## 2017-01-03 DIAGNOSIS — G894 Chronic pain syndrome: Secondary | ICD-10-CM | POA: Diagnosis not present

## 2017-01-03 DIAGNOSIS — Z0000 Encounter for general adult medical examination without abnormal findings: Secondary | ICD-10-CM | POA: Diagnosis not present

## 2017-01-03 DIAGNOSIS — N946 Dysmenorrhea, unspecified: Secondary | ICD-10-CM | POA: Diagnosis not present

## 2017-01-03 DIAGNOSIS — Z131 Encounter for screening for diabetes mellitus: Secondary | ICD-10-CM | POA: Diagnosis not present

## 2017-01-03 DIAGNOSIS — D259 Leiomyoma of uterus, unspecified: Secondary | ICD-10-CM | POA: Diagnosis not present

## 2017-01-03 DIAGNOSIS — E669 Obesity, unspecified: Secondary | ICD-10-CM | POA: Diagnosis not present

## 2017-01-03 DIAGNOSIS — D500 Iron deficiency anemia secondary to blood loss (chronic): Secondary | ICD-10-CM | POA: Diagnosis not present

## 2017-01-03 DIAGNOSIS — Z5181 Encounter for therapeutic drug level monitoring: Secondary | ICD-10-CM | POA: Diagnosis not present

## 2017-01-03 DIAGNOSIS — Z2821 Immunization not carried out because of patient refusal: Secondary | ICD-10-CM | POA: Diagnosis not present

## 2017-03-18 ENCOUNTER — Encounter (HOSPITAL_BASED_OUTPATIENT_CLINIC_OR_DEPARTMENT_OTHER): Payer: Self-pay | Admitting: Emergency Medicine

## 2017-03-18 ENCOUNTER — Inpatient Hospital Stay (HOSPITAL_BASED_OUTPATIENT_CLINIC_OR_DEPARTMENT_OTHER)
Admission: EM | Admit: 2017-03-18 | Discharge: 2017-03-22 | DRG: 176 | Disposition: A | Discharge status: Home / Self Care | Payer: Medicare Other | Attending: Internal Medicine | Admitting: Internal Medicine

## 2017-03-18 ENCOUNTER — Emergency Department (HOSPITAL_BASED_OUTPATIENT_CLINIC_OR_DEPARTMENT_OTHER): Payer: Medicare Other

## 2017-03-18 DIAGNOSIS — Z79891 Long term (current) use of opiate analgesic: Secondary | ICD-10-CM

## 2017-03-18 DIAGNOSIS — D649 Anemia, unspecified: Secondary | ICD-10-CM | POA: Diagnosis present

## 2017-03-18 DIAGNOSIS — J45909 Unspecified asthma, uncomplicated: Secondary | ICD-10-CM | POA: Diagnosis not present

## 2017-03-18 DIAGNOSIS — I2699 Other pulmonary embolism without acute cor pulmonale: Principal | ICD-10-CM | POA: Diagnosis present

## 2017-03-18 DIAGNOSIS — Z86718 Personal history of other venous thrombosis and embolism: Secondary | ICD-10-CM

## 2017-03-18 DIAGNOSIS — D509 Iron deficiency anemia, unspecified: Secondary | ICD-10-CM | POA: Diagnosis present

## 2017-03-18 DIAGNOSIS — Z79899 Other long term (current) drug therapy: Secondary | ICD-10-CM

## 2017-03-18 DIAGNOSIS — I10 Essential (primary) hypertension: Secondary | ICD-10-CM | POA: Diagnosis present

## 2017-03-18 DIAGNOSIS — R0602 Shortness of breath: Secondary | ICD-10-CM | POA: Diagnosis not present

## 2017-03-18 DIAGNOSIS — F4323 Adjustment disorder with mixed anxiety and depressed mood: Secondary | ICD-10-CM | POA: Diagnosis present

## 2017-03-18 DIAGNOSIS — Z96652 Presence of left artificial knee joint: Secondary | ICD-10-CM | POA: Diagnosis present

## 2017-03-18 DIAGNOSIS — E669 Obesity, unspecified: Secondary | ICD-10-CM | POA: Diagnosis present

## 2017-03-18 DIAGNOSIS — R7989 Other specified abnormal findings of blood chemistry: Secondary | ICD-10-CM

## 2017-03-18 DIAGNOSIS — R778 Other specified abnormalities of plasma proteins: Secondary | ICD-10-CM

## 2017-03-18 DIAGNOSIS — D259 Leiomyoma of uterus, unspecified: Secondary | ICD-10-CM | POA: Diagnosis present

## 2017-03-18 DIAGNOSIS — R748 Abnormal levels of other serum enzymes: Secondary | ICD-10-CM | POA: Diagnosis present

## 2017-03-18 DIAGNOSIS — Z6837 Body mass index (BMI) 37.0-37.9, adult: Secondary | ICD-10-CM

## 2017-03-18 DIAGNOSIS — E876 Hypokalemia: Secondary | ICD-10-CM | POA: Diagnosis present

## 2017-03-18 DIAGNOSIS — F909 Attention-deficit hyperactivity disorder, unspecified type: Secondary | ICD-10-CM | POA: Diagnosis present

## 2017-03-18 DIAGNOSIS — Z7952 Long term (current) use of systemic steroids: Secondary | ICD-10-CM

## 2017-03-18 DIAGNOSIS — R079 Chest pain, unspecified: Secondary | ICD-10-CM | POA: Diagnosis not present

## 2017-03-18 HISTORY — DX: Other pulmonary embolism without acute cor pulmonale: I26.99

## 2017-03-18 HISTORY — DX: Adjustment disorder with mixed anxiety and depressed mood: F43.23

## 2017-03-18 LAB — COMPREHENSIVE METABOLIC PANEL
ALBUMIN: 3.7 g/dL (ref 3.5–5.0)
ALK PHOS: 57 U/L (ref 38–126)
ALT: 12 U/L — AB (ref 14–54)
AST: 25 U/L (ref 15–41)
Anion gap: 8 (ref 5–15)
BUN: 8 mg/dL (ref 6–20)
CALCIUM: 9.2 mg/dL (ref 8.9–10.3)
CHLORIDE: 103 mmol/L (ref 101–111)
CO2: 25 mmol/L (ref 22–32)
CREATININE: 0.75 mg/dL (ref 0.44–1.00)
GFR calc Af Amer: >60 mL/min (ref >60)
GFR calc non Af Amer: >60 mL/min (ref >60)
GLUCOSE: 105 mg/dL — AB (ref 65–99)
Potassium: 2.9 mmol/L — ABNORMAL LOW (ref 3.5–5.1)
SODIUM: 136 mmol/L (ref 135–145)
Total Bilirubin: 0.5 mg/dL (ref 0.3–1.2)
Total Protein: 8.6 g/dL — ABNORMAL HIGH (ref 6.5–8.1)

## 2017-03-18 LAB — CBC WITH DIFFERENTIAL/PLATELET
Basophils Absolute: 0 10*3/uL (ref 0.0–0.1)
Basophils Relative: 0 %
EOS ABS: 0.2 10*3/uL (ref 0.0–0.7)
Eosinophils Relative: 3 %
HEMATOCRIT: 31.5 % — AB (ref 36.0–46.0)
Hemoglobin: 9.8 g/dL — ABNORMAL LOW (ref 12.0–15.0)
LYMPHS ABS: 2.4 10*3/uL (ref 0.7–4.0)
Lymphocytes Relative: 35 %
MCH: 23.9 pg — AB (ref 26.0–34.0)
MCHC: 31.1 g/dL (ref 30.0–36.0)
MCV: 76.8 fL — ABNORMAL LOW (ref 78.0–100.0)
MONO ABS: 0.6 10*3/uL (ref 0.1–1.0)
MONOS PCT: 8 %
NEUTROS ABS: 3.7 10*3/uL (ref 1.7–7.7)
Neutrophils Relative %: 54 %
Platelets: 247 10*3/uL (ref 150–400)
RBC: 4.1 MIL/uL (ref 3.87–5.11)
RDW: 17 % — AB (ref 11.5–15.5)
WBC: 6.9 10*3/uL (ref 4.0–10.5)

## 2017-03-18 LAB — D-DIMER, QUANTITATIVE: D-Dimer, Quant: 2.4 ug/mL-FEU — ABNORMAL HIGH (ref 0.00–0.50)

## 2017-03-18 LAB — TROPONIN I

## 2017-03-18 LAB — BRAIN NATRIURETIC PEPTIDE: B Natriuretic Peptide: 16.9 pg/mL (ref 0.0–100.0)

## 2017-03-18 NOTE — ED Provider Notes (Addendum)
[facility] DEPT [facility] Provider Note   CSN: [id number] Arrival date & time: 03/18/17  2238   By signing my name below, I, Eunice Blase, attest that this documentation has been prepared under the direction and in the presence of Merryl Hacker, MD. Electronically signed, Eunice Blase, ED Scribe. 03/18/17. 11:35 PM.   History   Chief Complaint Chief Complaint  Patient presents with  . Shortness of Breath   The history is provided by the patient and medical records. No language interpreter was used.    HPI Comments: Kristen Swanson is a 41 y.o. female with Hx of asthma and HTN who presents to the Emergency Department complaining of SOB x 2-3 days. She notes associated bilateral leg swelling and cramps left > right and 8/10 chest pain that "feels like someone is standing on [her] chest". She states she is unable to breath if she lays down flat. She notes she takes OTC medication to go to sleep at night and her current symptoms do not feel like an asthma attack. She adds she has not used albuterol as an attempt to modify her symptoms. Recent hydrochlorothiazide prescription noted to treat HTN. Pt denies Hx of heart surgery or heart disease.Denies history of heart failure. Denies any recent fevers or cough. Has history of blood clots, recent travel, recent hospitalization, birth control use.  Past Medical History:  Diagnosis Date  . ADHD (attention deficit hyperactivity disorder)   . Adjustment disorder with mixed anxiety and depressed mood   . Asthma   . Fibroid tumor   . Hypertension   . Kidney stones     Patient Active Problem List   Diagnosis Date Noted  . Fibroid uterus 01/01/2016  . Menorrhagia 01/01/2016  . ONYCHOMYCOSIS 02/26/2007  . OBESITY, NOS 02/26/2007  . Anemia 02/26/2007  . ASTHMA, UNSPECIFIED 02/26/2007    Past Surgical History:  Procedure Laterality Date  . LEG SURGERY    . REPLACEMENT TOTAL KNEE Left 2008    OB History    Gravida Para Term Preterm AB  Living   5 3 3   2 3    SAB TAB Ectopic Multiple Live Births   1               Home Medications    Prior to Admission medications   Medication Sig Start Date End Date Taking? Authorizing Provider  albuterol (PROVENTIL HFA;VENTOLIN HFA) 108 (90 Base) MCG/ACT inhaler Inhale 2 puffs into the lungs every 4 (four) hours as needed for wheezing or shortness of breath. 10/14/16   Delsa Grana, PA-C  albuterol (PROVENTIL HFA;VENTOLIN HFA) 108 (90 Base) MCG/ACT inhaler Inhale 2 puffs into the lungs every 4 (four) hours as needed for wheezing or shortness of breath. 11/06/16   Merryl Hacker, MD  amphetamine-dextroamphetamine (ADDERALL XR) 20 MG 24 hr capsule Take 20 mg by mouth daily.    Historical Provider, MD  cyclobenzaprine (FLEXERIL) 5 MG tablet Take 1 tablet (5 mg total) by mouth 3 (three) times daily as needed for muscle spasms. Patient not taking: Reported on 10/13/2016 10/26/15   Serita Grit, MD  ferrous sulfate 325 (65 FE) MG tablet Take 1 tablet (325 mg total) by mouth daily with breakfast. Patient taking differently: Take 325 mg by mouth at bedtime.  10/11/16   Tanna Furry, MD  fluticasone (FLONASE) 50 MCG/ACT nasal spray Place 2 sprays into both nostrils daily. 11/06/16   Merryl Hacker, MD  guaiFENesin-codeine 100-10 MG/5ML syrup Take 5 mLs by mouth 3 (three)  times daily as needed for cough. 10/14/16   Delsa Grana, PA-C  hydrochlorothiazide (HYDRODIURIL) 25 MG tablet Take 25 mg by mouth daily.    Historical Provider, MD  HYDROcodone-acetaminophen (NORCO) 10-325 MG tablet Take 1 tablet by mouth every 6 (six) hours as needed for moderate pain.    Historical Provider, MD  ibuprofen (ADVIL,MOTRIN) 800 MG tablet Take 800 mg by mouth every 8 (eight) hours as needed. Patient used this medication for headaches and leg pain.    Historical Provider, MD  metoprolol tartrate (LOPRESSOR) 25 MG tablet Take 25 mg by mouth daily. Reported on 01/01/2016    Historical Provider, MD  Multiple Vitamin  (MULTIVITAMIN WITH MINERALS) TABS tablet Take 1 tablet by mouth daily.    Historical Provider, MD  naproxen (NAPROSYN) 500 MG tablet Take 1 tablet (500 mg total) by mouth 2 (two) times daily. 10/27/16   Roxanna Mew, PA-C  ondansetron (ZOFRAN ODT) 4 MG disintegrating tablet Take 1 tablet (4 mg total) by mouth every 8 (eight) hours as needed for nausea or vomiting. 10/14/16   Delsa Grana, PA-C  oxyCODONE-acetaminophen (PERCOCET) 5-325 MG tablet Take 1 tablet by mouth every 4 (four) hours as needed. 10/14/16   Delsa Grana, PA-C  potassium chloride (K-DUR) 10 MEQ tablet Take 1 tablet (10 mEq total) by mouth 2 (two) times daily. 10/14/16 10/17/16  Delsa Grana, PA-C  predniSONE (DELTASONE) 20 MG tablet Take 2 tablets (40 mg total) by mouth daily. 11/06/16   Merryl Hacker, MD  sodium chloride (OCEAN) 0.65 % SOLN nasal spray Place 1 spray into both nostrils as needed for congestion. 11/06/16   Merryl Hacker, MD  traMADol (ULTRAM) 50 MG tablet Take 1 tablet (50 mg total) by mouth every 6 (six) hours as needed. 05/22/16   Gwen Pounds, CNM  valACYclovir (VALTREX) 500 MG tablet Take 500 mg by mouth daily. 12/27/15   Historical Provider, MD  zolpidem (AMBIEN) 10 MG tablet Take 10 mg by mouth at bedtime.  11/20/15   Historical Provider, MD    Family History No family history on file.  Social History Social History  Substance Use Topics  . Smoking status: Never Smoker  . Smokeless tobacco: Never Used  . Alcohol use No     Allergies   Patient has no known allergies.   Review of Systems Review of Systems  Constitutional: Negative for fever.  Respiratory: Positive for shortness of breath. Negative for cough.   Cardiovascular: Positive for chest pain and leg swelling.  Gastrointestinal: Negative for abdominal pain, nausea and vomiting.     Physical Exam Updated Vital Signs BP 129/79 (BP Location: Left Arm)   Pulse 96   Temp 98.3 F (36.8 C) (Oral)   Resp 20   Ht 5\' 9"  (1.753  m)   Wt 230 lb (104.3 kg)   LMP 03/08/2017   SpO2 100%   BMI 33.97 kg/m   Physical Exam  Constitutional: She is oriented to person, place, and time. She appears well-developed and well-nourished.  Overweight  HENT:  Head: Normocephalic and atraumatic.  Cardiovascular: Normal rate, regular rhythm and normal heart sounds.   No murmur heard. Pulmonary/Chest: Effort normal and breath sounds normal. No respiratory distress. She has no wheezes.  Abdominal: Soft. Bowel sounds are normal. There is no tenderness. There is no guarding.  Musculoskeletal: She exhibits edema.  Left greater than right lower extremity swelling, tenderness palpation left calf, old scarring noted  Neurological: She is alert and oriented to person, place,  and time.  Skin: Skin is warm and dry.  Psychiatric: She has a normal mood and affect.  Nursing note and vitals reviewed.    ED Treatments / Results  DIAGNOSTIC STUDIES: Oxygen Saturation is 100% on RA, normal by my interpretation.    COORDINATION OF CARE: 11:09 PM Discussed treatment plan with pt at bedside and pt agreed to plan.  Labs (all labs ordered are listed, but only abnormal results are displayed) Labs Reviewed  COMPREHENSIVE METABOLIC PANEL - Abnormal; Notable for the following:       Result Value   Potassium 2.9 (*)    Glucose, Bld 105 (*)    Total Protein 8.6 (*)    ALT 12 (*)    All other components within normal limits  CBC WITH DIFFERENTIAL/PLATELET - Abnormal; Notable for the following:    Hemoglobin 9.8 (*)    HCT 31.5 (*)    MCV 76.8 (*)    MCH 23.9 (*)    RDW 17.0 (*)    All other components within normal limits  D-DIMER, QUANTITATIVE (NOT AT [facility]) - Abnormal; Notable for the following:    D-Dimer, Quant 2.40 (*)    All other components within normal limits  BRAIN NATRIURETIC PEPTIDE  TROPONIN I    EKG  EKG Interpretation  Date/Time:  Tuesday March 18 2017 22:43:56 EDT Ventricular Rate:  96 PR Interval:  156 QRS  Duration: 94 QT Interval:  366 QTC Calculation: 462 R Axis:   53 Text Interpretation:  Normal sinus rhythm Cannot rule out Anterior infarct , age undetermined Abnormal ECG similar to prior EKG  Confirmed by LIU MD, DANA [phone]) on 03/18/2017 10:46:05 PM       Radiology Dg Chest 2 View  Result Date: 03/18/2017 CLINICAL DATA:  Acute onset of shortness of breath and generalized chest pain. Initial encounter. EXAM: CHEST  2 VIEW COMPARISON:  Chest radiograph performed 11/06/2016 FINDINGS: The lungs are well-aerated and clear. There is no evidence of focal opacification, pleural effusion or pneumothorax. The heart is normal in size; the mediastinal contour is within normal limits. No acute osseous abnormalities are seen. IMPRESSION: No acute cardiopulmonary process seen. Electronically Signed   By: Garald Balding M.D.   On: 03/18/2017 23:17   Ct Angio Chest Pe W And/or Wo Contrast  Result Date: 03/19/2017 CLINICAL DATA:  Acute onset of shortness breath and generalized chest pain. Elevated D-dimer. Bilateral leg swelling. Initial encounter. EXAM: CT ANGIOGRAPHY CHEST WITH CONTRAST TECHNIQUE: Multidetector CT imaging of the chest was performed using the standard protocol during bolus administration of intravenous contrast. Multiplanar CT image reconstructions and MIPs were obtained to evaluate the vascular anatomy. CONTRAST:  100 mL of Omnipaque 350 IV contrast COMPARISON:  Chest radiograph performed 03/18/2017 FINDINGS: Cardiovascular: Pulmonary embolus is noted within the main pulmonary arteries bilaterally, extending into all lobes of both lungs. There is an RV/LV ratio of nearly 1.0, reflecting right heart strain and concerning for at least submassive pulmonary embolus. The heart is unremarkable in appearance. The great vessels are grossly unremarkable. Mediastinum/Nodes: The mediastinum is otherwise unremarkable. No mediastinal lymphadenopathy is seen. No pericardial effusion is identified. The thyroid  gland is unremarkable. No axillary lymphadenopathy is appreciated. Lungs/Pleura: A trace right-sided pleural effusion is noted. The lungs are otherwise clear. No pneumothorax is seen. No masses are identified. Upper Abdomen: The visualized portions of the liver and spleen are grossly unremarkable. The visualized portions of the adrenal glands and right kidney are within normal limits. Musculoskeletal: No acute osseous  abnormalities are identified. The visualized musculature is unremarkable in appearance. Review of the MIP images confirms the above findings. IMPRESSION: 1. Pulmonary embolus within the main pulmonary arteries bilaterally, extending into all lobes of both lungs. Positive for acute PE with CT evidence of right heart strain (RV/LV Ratio = 1.0) consistent with at least submassive (intermediate risk) PE. The presence of right heart strain has been associated with an increased risk of morbidity and mortality. Please activate Code PE by paging [phone]. 2. Trace right-sided pleural effusion.  Lungs otherwise clear. Critical Value/emergent results were called by telephone at the time of interpretation on 03/19/2017 at 2:04 am to Dr. Thayer Jew, who verbally acknowledged these results. Electronically Signed   By: Garald Balding M.D.   On: 03/19/2017 02:05    Procedures Procedures (including critical care time)  CRITICAL CARE Performed by: Merryl Hacker   Total critical care time: 25 minutes  Critical care time was exclusive of separately billable procedures and treating other patients.  Critical care was necessary to treat or prevent imminent or life-threatening deterioration.  Critical care was time spent personally by me on the following activities: development of treatment plan with patient and/or surrogate as well as nursing, discussions with consultants, evaluation of patient's response to treatment, examination of patient, obtaining history from patient or surrogate, ordering  and performing treatments and interventions, ordering and review of laboratory studies, ordering and review of radiographic studies, pulse oximetry and re-evaluation of patient's condition.   Medications Ordered in ED Medications  iopamidol (ISOVUE-370) 76 % injection 100 mL (100 mLs Intravenous Incomplete 03/19/17 0050)  heparin ADULT infusion 100 units/mL (25000 units/264mL sodium chloride 0.45%) (not administered)     Initial Impression / Assessment and Plan / ED Course  I have reviewed the triage vital signs and the nursing notes.  Pertinent labs & imaging results that were available during my care of the patient were reviewed by me and considered in my medical decision making (see chart for details).     Patient presents with worsening shortness of breath, dyspnea on exertion, orthopnea, and chest pain. Overall nontoxic-appearing. Vital signs are reassuring. O2 sats 100%. She is in no acute distress. Pulmonary exam is fairly clear. She has lower extremity edema that was mildly asymmetric. No wheezing to suggest asthma. Chest x-ray is clear. EKG and troponin reassuring. D-dimer sent and 2.4. CT scan obtained and shows evidence of bilateral pulmonary embolism and likely submassive. Patient started on heparin. Will admit to the hospital.  2:27 AM Given burden on CT scan and RV ratio, code PE was paged to the intensivist. Discussed with Dr. Oletta Darter. Plan to admit to stepdown unit on heparin. Recommend getting an echocardiogram. Given that she is hemodynamically stable, systemic lytics are not indicated at this time.   Final Clinical Impressions(s) / ED Diagnoses   Final diagnoses:  Other acute pulmonary embolism without acute cor pulmonale (HCC)  SOB (shortness of breath)    New Prescriptions New Prescriptions   No medications on file   I personally performed the services described in this documentation, which was scribed in my presence. The recorded information has been reviewed and  is accurate.    Merryl Hacker, MD 03/19/17 [id number]    Merryl Hacker, MD 03/19/17 [phone]

## 2017-03-18 NOTE — ED Triage Notes (Signed)
Pt c/o shortness of breath with chest pain since yesterday. Pt has hx of asthma but has not used any of her medications.

## 2017-03-19 ENCOUNTER — Encounter (HOSPITAL_BASED_OUTPATIENT_CLINIC_OR_DEPARTMENT_OTHER): Payer: Self-pay | Admitting: Emergency Medicine

## 2017-03-19 ENCOUNTER — Emergency Department (HOSPITAL_BASED_OUTPATIENT_CLINIC_OR_DEPARTMENT_OTHER): Payer: Medicare Other

## 2017-03-19 DIAGNOSIS — E669 Obesity, unspecified: Secondary | ICD-10-CM | POA: Diagnosis present

## 2017-03-19 DIAGNOSIS — R748 Abnormal levels of other serum enzymes: Secondary | ICD-10-CM | POA: Diagnosis present

## 2017-03-19 DIAGNOSIS — Z79899 Other long term (current) drug therapy: Secondary | ICD-10-CM | POA: Diagnosis not present

## 2017-03-19 DIAGNOSIS — Z79891 Long term (current) use of opiate analgesic: Secondary | ICD-10-CM | POA: Diagnosis not present

## 2017-03-19 DIAGNOSIS — Z6837 Body mass index (BMI) 37.0-37.9, adult: Secondary | ICD-10-CM | POA: Diagnosis not present

## 2017-03-19 DIAGNOSIS — R7989 Other specified abnormal findings of blood chemistry: Secondary | ICD-10-CM | POA: Diagnosis not present

## 2017-03-19 DIAGNOSIS — E876 Hypokalemia: Secondary | ICD-10-CM | POA: Diagnosis present

## 2017-03-19 DIAGNOSIS — F909 Attention-deficit hyperactivity disorder, unspecified type: Secondary | ICD-10-CM | POA: Diagnosis present

## 2017-03-19 DIAGNOSIS — I2699 Other pulmonary embolism without acute cor pulmonale: Secondary | ICD-10-CM | POA: Diagnosis not present

## 2017-03-19 DIAGNOSIS — D509 Iron deficiency anemia, unspecified: Secondary | ICD-10-CM | POA: Diagnosis not present

## 2017-03-19 DIAGNOSIS — J45909 Unspecified asthma, uncomplicated: Secondary | ICD-10-CM | POA: Diagnosis present

## 2017-03-19 DIAGNOSIS — Z96652 Presence of left artificial knee joint: Secondary | ICD-10-CM | POA: Diagnosis present

## 2017-03-19 DIAGNOSIS — D259 Leiomyoma of uterus, unspecified: Secondary | ICD-10-CM | POA: Diagnosis present

## 2017-03-19 DIAGNOSIS — Z7952 Long term (current) use of systemic steroids: Secondary | ICD-10-CM | POA: Diagnosis not present

## 2017-03-19 DIAGNOSIS — Z86718 Personal history of other venous thrombosis and embolism: Secondary | ICD-10-CM | POA: Diagnosis not present

## 2017-03-19 DIAGNOSIS — I10 Essential (primary) hypertension: Secondary | ICD-10-CM | POA: Diagnosis present

## 2017-03-19 DIAGNOSIS — F4323 Adjustment disorder with mixed anxiety and depressed mood: Secondary | ICD-10-CM | POA: Diagnosis present

## 2017-03-19 DIAGNOSIS — R778 Other specified abnormalities of plasma proteins: Secondary | ICD-10-CM

## 2017-03-19 LAB — ANTITHROMBIN III: AntiThromb III Func: 86 % (ref 75–120)

## 2017-03-19 LAB — HIV ANTIBODY (ROUTINE TESTING W REFLEX): HIV Screen 4th Generation wRfx: NONREACTIVE

## 2017-03-19 LAB — HEPARIN LEVEL (UNFRACTIONATED)
Heparin Unfractionated: 0.84 IU/mL — ABNORMAL HIGH (ref 0.30–0.70)
Heparin Unfractionated: 0.46 IU/mL (ref 0.30–0.70)

## 2017-03-19 LAB — PREGNANCY, URINE: PREG TEST UR: NEGATIVE

## 2017-03-19 LAB — MRSA PCR SCREENING: MRSA by PCR: NEGATIVE

## 2017-03-19 LAB — TROPONIN I: Troponin I: <0.03 ng/mL (ref <0.03)

## 2017-03-19 MED ORDER — ALBUTEROL SULFATE (2.5 MG/3ML) 0.083% IN NEBU: 2.5000 mg | INHALATION_SOLUTION | RESPIRATORY_TRACT | Status: DC | PRN

## 2017-03-19 MED ORDER — SODIUM CHLORIDE 0.9 % IV SOLN
INTRAVENOUS | Status: DC
  Administered 2017-03-19: 03:00:00 via INTRAVENOUS
  Administered 2017-03-20: 75 mL/h via INTRAVENOUS

## 2017-03-19 MED ORDER — NAPROXEN 250 MG PO TABS: 500.0000 mg | ORAL_TABLET | Freq: Two times a day (BID) | ORAL | Status: DC

## 2017-03-19 MED ORDER — HEPARIN (PORCINE) IN NACL 100-0.45 UNIT/ML-% IJ SOLN
1400.0000 [IU]/h | INTRAMUSCULAR | Status: DC
Start: 2017-03-19 — End: 2017-03-20
  Administered 2017-03-19: 1350 [IU]/h via INTRAVENOUS
  Administered 2017-03-19: 1500 [IU]/h via INTRAVENOUS
  Administered 2017-03-20: 1400 [IU]/h via INTRAVENOUS
  Filled 2017-03-19 (×2): qty 250

## 2017-03-19 MED ORDER — HEPARIN BOLUS VIA INFUSION
5500.0000 [IU] | Freq: Once | INTRAVENOUS | Status: AC
Start: 2017-03-19 — End: 2017-03-19
  Administered 2017-03-19: 5500 [IU] via INTRAVENOUS

## 2017-03-19 MED ORDER — HEPARIN (PORCINE) IN NACL 100-0.45 UNIT/ML-% IJ SOLN
14.0000 [IU]/kg/h | Freq: Once | INTRAMUSCULAR | Status: AC
  Administered 2017-03-19: 14 [IU]/kg/h via INTRAVENOUS
  Filled 2017-03-19: qty 250

## 2017-03-19 MED ORDER — PREDNISONE 20 MG PO TABS: 40.0000 mg | ORAL_TABLET | Freq: Every day | ORAL | Status: DC

## 2017-03-19 MED ORDER — ZOLPIDEM TARTRATE 5 MG PO TABS
5.0000 mg | ORAL_TABLET | Freq: Every day | ORAL | Status: DC
  Administered 2017-03-19 – 2017-03-21 (×3): 5 mg via ORAL
  Filled 2017-03-19 (×3): qty 1

## 2017-03-19 MED ORDER — POTASSIUM CHLORIDE CRYS ER 20 MEQ PO TBCR
40.0000 meq | EXTENDED_RELEASE_TABLET | Freq: Once | ORAL | Status: AC
  Administered 2017-03-19: 40 meq via ORAL
  Filled 2017-03-19: qty 2

## 2017-03-19 MED ORDER — MAGNESIUM SULFATE IN D5W 1-5 GM/100ML-% IV SOLN
1.0000 g | Freq: Once | INTRAVENOUS | Status: AC
  Administered 2017-03-19: 1 g via INTRAVENOUS
  Filled 2017-03-19: qty 100

## 2017-03-19 MED ORDER — POTASSIUM CHLORIDE ER 10 MEQ PO TBCR: 10.0000 meq | EXTENDED_RELEASE_TABLET | Freq: Two times a day (BID) | ORAL | Status: DC

## 2017-03-19 MED ORDER — MORPHINE SULFATE (PF) 2 MG/ML IV SOLN
2.0000 mg | INTRAVENOUS | Status: DC | PRN
  Administered 2017-03-19 – 2017-03-21 (×7): 2 mg via INTRAVENOUS
  Filled 2017-03-19 (×8): qty 1

## 2017-03-19 MED ORDER — HYDROCHLOROTHIAZIDE 25 MG PO TABS
25.0000 mg | ORAL_TABLET | Freq: Every day | ORAL | Status: DC
  Administered 2017-03-19 – 2017-03-20 (×2): 25 mg via ORAL
  Filled 2017-03-19 (×2): qty 1

## 2017-03-19 MED ORDER — SENNOSIDES-DOCUSATE SODIUM 8.6-50 MG PO TABS
1.0000 | ORAL_TABLET | Freq: Every evening | ORAL | Status: DC | PRN
  Administered 2017-03-19 (×2): 1 via ORAL
  Filled 2017-03-19 (×2): qty 1

## 2017-03-19 MED ORDER — AMPHETAMINE-DEXTROAMPHET ER 20 MG PO CP24: 20.0000 mg | ORAL_CAPSULE | Freq: Every day | ORAL | Status: DC

## 2017-03-19 MED ORDER — POTASSIUM CHLORIDE CRYS ER 20 MEQ PO TBCR: 40.0000 meq | EXTENDED_RELEASE_TABLET | Freq: Two times a day (BID) | ORAL | Status: DC

## 2017-03-19 MED ORDER — ONDANSETRON 4 MG PO TBDP
4.0000 mg | ORAL_TABLET | Freq: Three times a day (TID) | ORAL | Status: DC | PRN
  Administered 2017-03-20: 4 mg via ORAL
  Filled 2017-03-19 (×3): qty 1

## 2017-03-19 MED ORDER — VALACYCLOVIR HCL 500 MG PO TABS
500.0000 mg | ORAL_TABLET | Freq: Every day | ORAL | Status: DC
  Administered 2017-03-19 – 2017-03-22 (×4): 500 mg via ORAL
  Filled 2017-03-19 (×4): qty 1

## 2017-03-19 MED ORDER — FLUTICASONE PROPIONATE 50 MCG/ACT NA SUSP
2.0000 | Freq: Every day | NASAL | Status: DC
  Administered 2017-03-20 – 2017-03-22 (×3): 2 via NASAL
  Filled 2017-03-19: qty 16

## 2017-03-19 MED ORDER — GUAIFENESIN-CODEINE 100-10 MG/5ML PO SOLN: 5.0000 mL | Freq: Three times a day (TID) | ORAL | Status: DC | PRN

## 2017-03-19 MED ORDER — POTASSIUM CHLORIDE 20 MEQ/15ML (10%) PO SOLN
40.0000 meq | Freq: Once | ORAL | Status: AC
  Administered 2017-03-19: 40 meq via ORAL

## 2017-03-19 MED ORDER — METOPROLOL TARTRATE 25 MG PO TABS: 25.0000 mg | ORAL_TABLET | Freq: Every day | ORAL | Status: DC

## 2017-03-19 MED ORDER — POTASSIUM CHLORIDE 20 MEQ/15ML (10%) PO SOLN
ORAL | Status: AC
  Administered 2017-03-19: 40 meq via ORAL
  Filled 2017-03-19: qty 30

## 2017-03-19 MED ORDER — IOPAMIDOL (ISOVUE-370) INJECTION 76%: 100.0000 mL | Freq: Once | INTRAVENOUS | Status: DC | PRN

## 2017-03-19 MED ORDER — SODIUM CHLORIDE 0.9% FLUSH
3.0000 mL | Freq: Two times a day (BID) | INTRAVENOUS | Status: DC
  Administered 2017-03-19 – 2017-03-21 (×4): 3 mL via INTRAVENOUS

## 2017-03-19 MED ORDER — FERROUS SULFATE 325 (65 FE) MG PO TABS
325.0000 mg | ORAL_TABLET | Freq: Every day | ORAL | Status: DC
  Administered 2017-03-19 – 2017-03-21 (×3): 325 mg via ORAL
  Filled 2017-03-19 (×3): qty 1

## 2017-03-19 MED ORDER — OXYCODONE-ACETAMINOPHEN 5-325 MG PO TABS
1.0000 | ORAL_TABLET | ORAL | Status: DC | PRN
Start: 2017-03-19 — End: 2017-03-20
  Administered 2017-03-19 – 2017-03-20 (×6): 1 via ORAL
  Filled 2017-03-19 (×6): qty 1

## 2017-03-19 NOTE — ED Notes (Signed)
[facility] here for transport. 

## 2017-03-19 NOTE — Progress Notes (Signed)
ANTICOAGULATION CONSULT NOTE - Follow Up Consult  Pharmacy Consult for Heparin Indication: pulmonary embolus  No Known Allergies  Patient Measurements: Height: 5\' 10"  (177.8 cm) Weight: 258 lb (117 kg) IBW/kg (Calculated) : 68.5 Heparin Dosing Weight: 95  Vital Signs: Temp: 98.6 F (37 C) (03/21 0707) Temp Source: Oral (03/21 0707) BP: 122/94 (03/21 0707) Pulse Rate: 82 (03/21 0707)  Labs:  Recent Labs  03/18/17 2301 03/19/17 0836  HGB 9.8*  --   HCT 31.5*  --   PLT 247  --   HEPARINUNFRC  --  0.84*  CREATININE 0.75  --   TROPONINI <0.03 <0.03    Estimated Creatinine Clearance: 128.4 mL/min (by C-G formula based on SCr of 0.75 mg/dL).   Medications:  Scheduled:  . ferrous sulfate  325 mg Oral QHS  . fluticasone  2 spray Each Nare Daily  . hydrochlorothiazide  25 mg Oral Daily  . sodium chloride flush  3 mL Intravenous Q12H  . valACYclovir  500 mg Oral Daily  . zolpidem  5 mg Oral QHS    Assessment: 41yo female with PE.  Heparin level supratherapeutic this AM on current rate.  No bleeding, labs drawn appropriately.    Noted PMH of PE and on no anticoagulation.  I d/w pt and she told me that she has never had a diagnosed clot but did receive anticoagulation in 2006 after leg surgery as VTE prophylaxis.  Goal of Therapy:  Heparin level 0.3-0.7 units/ml Monitor platelets by anticoagulation protocol: Yes   Plan:  Decrease heparin to 1350 units/hr Repeat heparin level 6hr Daily Heparin level, CBC F/U plans to start oral anticoagulation   Gracy Bruins, [facility]

## 2017-03-19 NOTE — ED Notes (Signed)
Patient transported to CT 

## 2017-03-19 NOTE — Progress Notes (Signed)
ANTICOAGULATION CONSULT NOTE - Initial Consult  Pharmacy Consult for Heparin Indication: pulmonary embolus  No Known Allergies  Patient Measurements: Height: 5\' 9"  (175.3 cm) Weight: 230 lb (104.3 kg) IBW/kg (Calculated) : 66.2 Heparin Dosing Weight: 90 kg  Vital Signs: Temp: 98.3 F (36.8 C) (03/20 2242) Temp Source: Oral (03/20 2242) BP: 134/91 (03/21 0230) Pulse Rate: 93 (03/21 0230)  Labs:  Recent Labs  03/18/17 2301  HGB 9.8*  HCT 31.5*  PLT 247  CREATININE 0.75  TROPONINI <0.03    Estimated Creatinine Clearance: 118.9 mL/min (by C-G formula based on SCr of 0.75 mg/dL).   Medical History: Past Medical History:  Diagnosis Date  . ADHD (attention deficit hyperactivity disorder)   . Adjustment disorder with mixed anxiety and depressed mood   . Asthma   . Fibroid tumor   . Hypertension   . Kidney stones     Medications:  Awaiting electronic med rec  Assessment: 41 y.o. F presents with SOB. CT shows pulmonary embolus within the main pulmonary arteries bilaterally, extending into all lobes of both lungs. Positive for acute PE with CT evidence of right heart strain (RV/LV Ratio = 1.0) consistent with at least submassive (intermediate risk) PE. To begin heparin. Hgb 9.8 (low but stable for this patient), plt wnl.  Goal of Therapy:  Heparin level 0.3-0.7 units/ml Monitor platelets by anticoagulation protocol: Yes   Plan:  Heparin IV bolus 5500 units Heparin gtt at 1500 units/hr Will f/u heparin level in 6 hours Daily heparin level and CBC  Sherlon Handing, PharmD, BCPS Clinical pharmacist, pager [phone] 03/19/2017,2:48 AM

## 2017-03-19 NOTE — Progress Notes (Addendum)
ANTICOAGULATION CONSULT NOTE - Follow Up Consult  Pharmacy Consult for Heparin Indication: pulmonary embolus  No Known Allergies  Patient Measurements: Height: 5\' 10"  (177.8 cm) Weight: 258 lb (117 kg) IBW/kg (Calculated) : 68.5 Heparin Dosing Weight: 95  Vital Signs: Temp: 97.6 F (36.4 C) (03/21 1616) Temp Source: Oral (03/21 1616) BP: 117/84 (03/21 2000) Pulse Rate: 90 (03/21 2000)  Labs:  Recent Labs  03/18/17 2301 03/19/17 0836 03/19/17 1927  HGB 9.8*  --   --   HCT 31.5*  --   --   PLT 247  --   --   HEPARINUNFRC  --  0.84* 0.46  CREATININE 0.75  --   --   TROPONINI <0.03 <0.03  --     Estimated Creatinine Clearance: 128.4 mL/min (by C-G formula based on SCr of 0.75 mg/dL).   Medications:  Scheduled:  . ferrous sulfate  325 mg Oral QHS  . fluticasone  2 spray Each Nare Daily  . hydrochlorothiazide  25 mg Oral Daily  . sodium chloride flush  3 mL Intravenous Q12H  . valACYclovir  500 mg Oral Daily  . zolpidem  5 mg Oral QHS    Assessment: 41yo female with PE. Pharmacy consulted to dose Heparin Heparin therapeutic this evening at 0.46.  No bleeding noted.    Goal of Therapy:  Heparin level 0.3-0.7 units/ml Monitor platelets by anticoagulation protocol: Yes   Plan:  Increase heparin slightly to 1400 units/hr F/u morning and daily Heparin level, CBC F/U plans to start oral anticoagulation   Thank you for allowing [place] to participate in this patients care. Jens Som, PharmD Pager: [phone]

## 2017-03-19 NOTE — H&P (Signed)
History and Physical    [name]:[id number] DOB: 12-Jun-1976 DOA: 03/18/2017  PCP: No PCP Per Patient Patient coming from: home  Chief Complaint: sob/chest pain  HPI: Kristen Swanson is a very pleasant 41 y.o. female with medical history significant for hypertension, ADHD, fibroids, asthma, obesity, presents to the emergency department with chief complaint of chest pain and shortness of breath as well as bilateral lower extremity edema. Initial evaluation included a CTA of chest revealing pulmonary embolus.  Information is obtained from the patient. She reports to 3 day history of gradual worsening shortness of breath. Associated symptoms include bilateral lower extremity swelling and pain in her left calf. She also reports a chest "pressure". She states it feels like "someone standing on her chest. Pain radiates to her back. She states the pain is worse with deep breathing. She denies headache dizziness syncope or near-syncope. She denies abdominal pain nausea vomiting diaphoresis. She denies dysuria hematuria frequency or urgency. She denies diarrhea constipation melena or bright red blood per rectum.    ED Course: In the emergency department at the drip is initiated. She is afebrile hemodynamically stable and not hypoxic  Review of Systems: As per HPI otherwise 10 point review of systems negative.   Ambulatory Status: ambulates independently is independent with ADLs  Past Medical History:  Diagnosis Date  . ADHD (attention deficit hyperactivity disorder)   . Adjustment disorder with mixed anxiety and depressed mood   . Asthma   . Fibroid tumor   . Hypertension   . Kidney stones   . Pulmonary emboli [facility])     Past Surgical History:  Procedure Laterality Date  . LEG SURGERY    . REPLACEMENT TOTAL KNEE Left 2008    Social History   Social History  . Marital status: Single    Spouse name: N/A  . Number of children: N/A  . Years of education: N/A   Occupational History    . Not on file.   Social History Main Topics  . Smoking status: Never Smoker  . Smokeless tobacco: Never Used  . Alcohol use No  . Drug use: No  . Sexual activity: No   Other Topics Concern  . Not on file   Social History Narrative  . No narrative on file    No Known Allergies  Family History  Problem Relation Age of Onset  . Colon cancer Mother   . Stroke Father     Prior to Admission medications   Medication Sig Start Date End Date Taking? Authorizing Provider  albuterol (PROVENTIL HFA;VENTOLIN HFA) 108 (90 Base) MCG/ACT inhaler Inhale 2 puffs into the lungs every 4 (four) hours as needed for wheezing or shortness of breath. 10/14/16   Delsa Grana, PA-C  albuterol (PROVENTIL HFA;VENTOLIN HFA) 108 (90 Base) MCG/ACT inhaler Inhale 2 puffs into the lungs every 4 (four) hours as needed for wheezing or shortness of breath. 11/06/16   Merryl Hacker, MD  amphetamine-dextroamphetamine (ADDERALL XR) 20 MG 24 hr capsule Take 20 mg by mouth daily.    Historical Provider, MD  cyclobenzaprine (FLEXERIL) 5 MG tablet Take 1 tablet (5 mg total) by mouth 3 (three) times daily as needed for muscle spasms. Patient not taking: Reported on 10/13/2016 10/26/15   Serita Grit, MD  ferrous sulfate 325 (65 FE) MG tablet Take 1 tablet (325 mg total) by mouth daily with breakfast. Patient taking differently: Take 325 mg by mouth at bedtime.  10/11/16   Tanna Furry, MD  fluticasone (  FLONASE) 50 MCG/ACT nasal spray Place 2 sprays into both nostrils daily. 11/06/16   Merryl Hacker, MD  guaiFENesin-codeine 100-10 MG/5ML syrup Take 5 mLs by mouth 3 (three) times daily as needed for cough. 10/14/16   Delsa Grana, PA-C  hydrochlorothiazide (HYDRODIURIL) 25 MG tablet Take 25 mg by mouth daily.    Historical Provider, MD  HYDROcodone-acetaminophen (NORCO) 10-325 MG tablet Take 1 tablet by mouth every 6 (six) hours as needed for moderate pain.    Historical Provider, MD  ibuprofen (ADVIL,MOTRIN) 800 MG  tablet Take 800 mg by mouth every 8 (eight) hours as needed. Patient used this medication for headaches and leg pain.    Historical Provider, MD  metoprolol tartrate (LOPRESSOR) 25 MG tablet Take 25 mg by mouth daily. Reported on 01/01/2016    Historical Provider, MD  Multiple Vitamin (MULTIVITAMIN WITH MINERALS) TABS tablet Take 1 tablet by mouth daily.    Historical Provider, MD  naproxen (NAPROSYN) 500 MG tablet Take 1 tablet (500 mg total) by mouth 2 (two) times daily. 10/27/16   Roxanna Mew, PA-C  ondansetron (ZOFRAN ODT) 4 MG disintegrating tablet Take 1 tablet (4 mg total) by mouth every 8 (eight) hours as needed for nausea or vomiting. 10/14/16   Delsa Grana, PA-C  oxyCODONE-acetaminophen (PERCOCET) 5-325 MG tablet Take 1 tablet by mouth every 4 (four) hours as needed. 10/14/16   Delsa Grana, PA-C  potassium chloride (K-DUR) 10 MEQ tablet Take 1 tablet (10 mEq total) by mouth 2 (two) times daily. 10/14/16 10/17/16  Delsa Grana, PA-C  predniSONE (DELTASONE) 20 MG tablet Take 2 tablets (40 mg total) by mouth daily. 11/06/16   Merryl Hacker, MD  sodium chloride (OCEAN) 0.65 % SOLN nasal spray Place 1 spray into both nostrils as needed for congestion. 11/06/16   Merryl Hacker, MD  traMADol (ULTRAM) 50 MG tablet Take 1 tablet (50 mg total) by mouth every 6 (six) hours as needed. 05/22/16   Gwen Pounds, CNM  valACYclovir (VALTREX) 500 MG tablet Take 500 mg by mouth daily. 12/27/15   Historical Provider, MD  zolpidem (AMBIEN) 10 MG tablet Take 10 mg by mouth at bedtime.  11/20/15   Historical Provider, MD    Physical Exam: Vitals:   03/19/17 0330 03/19/17 0446 03/19/17 0452 03/19/17 0707  BP: 117/79  130/77 (!) 122/94  Pulse: 87   82  Resp: (!) 21  19 17   Temp:   97.9 F (36.6 C) 98.6 F (37 C)  TempSrc:   Oral Oral  SpO2: 99%  100% 100%  Weight:  117 kg (258 lb)    Height:  5\' 10"  (1.778 m)       General:  Appears calm and comfortable no Acute distress Eyes:  PERRL,  EOMI, normal lids, iris ENT:  grossly normal hearing, lips & tongue, his membranes of her mouth are moist and pink Neck:  no LAD, masses or thyromegaly Cardiovascular:  RRR, no m/r/g. Trace LE edema. Calf tenderness Pulses present and palpable Respiratory:  Increased work of breathing.sounds clear bilaterally  Abdomen:  soft, ntnd, positive bowel sounds throughout no guarding or rebounding  Skin:  no rash or induration seen on limited exam Musculoskeletal:  grossly normal tone BUE/BLE, good ROM, no bony abnormality Psychiatric:  grossly normal mood and affect, speech fluent and appropriate, AOx3 Neurologic:  CN 2-12 grossly intact, moves all extremities in coordinated fashion, sensation intact  Labs on Admission: I have personally reviewed following labs and imaging studies  CBC:  Recent Labs Lab 03/18/17 2301  WBC 6.9  NEUTROABS 3.7  HGB 9.8*  HCT 31.5*  MCV 76.8*  PLT 166   Basic Metabolic Panel:  Recent Labs Lab 03/18/17 2301  NA 136  K 2.9*  CL 103  CO2 25  GLUCOSE 105*  BUN 8  CREATININE 0.75  CALCIUM 9.2   GFR: Estimated Creatinine Clearance: 128.4 mL/min (by C-G formula based on SCr of 0.75 mg/dL). Liver Function Tests:  Recent Labs Lab 03/18/17 2301  AST 25  ALT 12*  ALKPHOS 57  BILITOT 0.5  PROT 8.6*  ALBUMIN 3.7   No results for input(s): LIPASE, AMYLASE in the last 168 hours. No results for input(s): AMMONIA in the last 168 hours. Coagulation Profile: No results for input(s): INR, PROTIME in the last 168 hours. Cardiac Enzymes:  Recent Labs Lab 03/18/17 2301  TROPONINI <0.03   BNP (last 3 results) No results for input(s): PROBNP in the last 8760 hours. HbA1C: No results for input(s): HGBA1C in the last 72 hours. CBG: No results for input(s): GLUCAP in the last 168 hours. Lipid Profile: No results for input(s): CHOL, HDL, LDLCALC, TRIG, CHOLHDL, LDLDIRECT in the last 72 hours. Thyroid Function Tests: No results for input(s): TSH,  T4TOTAL, FREET4, T3FREE, THYROIDAB in the last 72 hours. Anemia Panel: No results for input(s): VITAMINB12, FOLATE, FERRITIN, TIBC, IRON, RETICCTPCT in the last 72 hours. Urine analysis:    Component Value Date/Time   COLORURINE YELLOW 10/13/2016 2040   APPEARANCEUR CLEAR 10/13/2016 2040   LABSPEC 1.006 10/13/2016 2040   PHURINE 6.5 10/13/2016 2040   GLUCOSEU NEGATIVE 10/13/2016 2040   HGBUR NEGATIVE 10/13/2016 2040   BILIRUBINUR NEGATIVE 10/13/2016 2040   KETONESUR NEGATIVE 10/13/2016 2040   PROTEINUR NEGATIVE 10/13/2016 2040   UROBILINOGEN 0.2 12/19/2008 0127   NITRITE NEGATIVE 10/13/2016 2040   LEUKOCYTESUR NEGATIVE 10/13/2016 2040    Creatinine Clearance: Estimated Creatinine Clearance: 128.4 mL/min (by C-G formula based on SCr of 0.75 mg/dL).  Sepsis Labs: @LABRCNTIP (procalcitonin:4,lacticidven:4) ) Recent Results (from the past 240 hour(s))  MRSA PCR Screening     Status: None   Collection Time: 03/19/17  5:06 AM  Result Value Ref Range Status   MRSA by PCR NEGATIVE NEGATIVE Final    Comment:        The GeneXpert MRSA Assay (FDA approved for NASAL specimens only), is one component of a comprehensive MRSA colonization surveillance program. It is not intended to diagnose MRSA infection nor to guide or monitor treatment for MRSA infections.      Radiological Exams on Admission: Dg Chest 2 View  Result Date: 03/18/2017 CLINICAL DATA:  Acute onset of shortness of breath and generalized chest pain. Initial encounter. EXAM: CHEST  2 VIEW COMPARISON:  Chest radiograph performed 11/06/2016 FINDINGS: The lungs are well-aerated and clear. There is no evidence of focal opacification, pleural effusion or pneumothorax. The heart is normal in size; the mediastinal contour is within normal limits. No acute osseous abnormalities are seen. IMPRESSION: No acute cardiopulmonary process seen. Electronically Signed   By: Garald Balding M.D.   On: 03/18/2017 23:17   Ct Angio Chest Pe  W And/or Wo Contrast  Result Date: 03/19/2017 CLINICAL DATA:  Acute onset of shortness breath and generalized chest pain. Elevated D-dimer. Bilateral leg swelling. Initial encounter. EXAM: CT ANGIOGRAPHY CHEST WITH CONTRAST TECHNIQUE: Multidetector CT imaging of the chest was performed using the standard protocol during bolus administration of intravenous contrast. Multiplanar CT image reconstructions and MIPs were obtained to  evaluate the vascular anatomy. CONTRAST:  100 mL of Omnipaque 350 IV contrast COMPARISON:  Chest radiograph performed 03/18/2017 FINDINGS: Cardiovascular: Pulmonary embolus is noted within the main pulmonary arteries bilaterally, extending into all lobes of both lungs. There is an RV/LV ratio of nearly 1.0, reflecting right heart strain and concerning for at least submassive pulmonary embolus. The heart is unremarkable in appearance. The great vessels are grossly unremarkable. Mediastinum/Nodes: The mediastinum is otherwise unremarkable. No mediastinal lymphadenopathy is seen. No pericardial effusion is identified. The thyroid gland is unremarkable. No axillary lymphadenopathy is appreciated. Lungs/Pleura: A trace right-sided pleural effusion is noted. The lungs are otherwise clear. No pneumothorax is seen. No masses are identified. Upper Abdomen: The visualized portions of the liver and spleen are grossly unremarkable. The visualized portions of the adrenal glands and right kidney are within normal limits. Musculoskeletal: No acute osseous abnormalities are identified. The visualized musculature is unremarkable in appearance. Review of the MIP images confirms the above findings. IMPRESSION: 1. Pulmonary embolus within the main pulmonary arteries bilaterally, extending into all lobes of both lungs. Positive for acute PE with CT evidence of right heart strain (RV/LV Ratio = 1.0) consistent with at least submassive (intermediate risk) PE. The presence of right heart strain has been associated  with an increased risk of morbidity and mortality. Please activate Code PE by paging [phone]. 2. Trace right-sided pleural effusion.  Lungs otherwise clear. Critical Value/emergent results were called by telephone at the time of interpretation on 03/19/2017 at 2:04 am to Dr. Thayer Jew, who verbally acknowledged these results. Electronically Signed   By: Garald Balding M.D.   On: 03/19/2017 02:05    EKG: Independently reviewed. Normal sinus rhythm Cannot rule out Anterior infarct , age undetermined Abnormal ECG similar to prior EKG   Assessment/Plan Principal Problem:   PE (pulmonary thromboembolism) (HCC) Active Problems:   Obesity   Anemia   Asthma   Hypokalemia   Pulmonary emboli ([place])     1. Pulmonary emboli. CTA of the L-spine pulmonary embolus within the main pulmonary arteries bilaterally extending into all lobes of both lungs. evidenceof right heart strain constent with at least submassive PE. Etiology unclear. No recent travel. No hx cancer, no recent surgery. Heparin gtt initiated in ED. Per chart PCCM consulted per EDP. Recommend IV heparin and echo -Admit to telemetry -Continue heparin drip -Transition to by mouth when indicated -Doppler of left lower extremity -Echocardiogram -hypercoag panel -cycle troponin -oxygen supplementation as needed  #2. Hypokalemia. Potassium level 2.9. repleted in ED. Magnesium 1 gm given as well -replete -recheck  3. Anemia. Microcytic. chronic. Hg and 0.8 on admission. Appears to be close to baseline.  No Signs symptoms of active bleeding -monitor -anemia panel  4. Asthma. Stable at baseline  #5. Obesity. BMI 37.1 nutritional consult    DVT prophylaxis: heparin  Code Status: full  Family Communication: none  Disposition Plan: home  Consults called: none  Admission status: inpatient    Radene Gunning MD [facility]  If 7PM-7AM, please contact night-coverage [email or web address] Password [email or web address]  03/19/2017, 9:30 AM

## 2017-03-19 NOTE — Progress Notes (Addendum)
This is a no charge note in response to nurse call  Transfer from [facility] per Dr. Dina Rich  41 year old lady with past medical history of hypertension, asthma, ADHD, fibroids, who presents with chest pain, SOB and bilateral leg swelling. CTA of chest showed pulmonary embolus within the main pulmonary arteries bilaterally, extending into all lobes of both lungs. Positive for acute PE with CT evidence of right heart strain (RV/LV Ratio = 1.0) consistent with at least submassive (intermediate risk) PE. Currently hemodynamically stable. PCCM was consulted by EDP. Dr. Oletta Darter recommended IV heparin and get 2-D echo. Will check hypercoagulable panel, lower extremity venous Doppler. K=2.9-->40 mEq of KCl was given and also gave 1 g of magnesium sulfate.  Ivor Costa, MD  [facility] Pager [phone]  If 7PM-7AM, please contact night-coverage [email or web address] Password [email or web address] 03/19/2017, 4:31 AM

## 2017-03-19 NOTE — Care Management Note (Addendum)
Case Management Note  Patient Details  Name: Kristen Swanson MRN: [id number] Date of Birth: [birth year]  Subjective/Objective: Pt presented for SOB and Chest Pain. Positive for Pulmonary Emboli. Plan to return home once stable.                   Action/Plan: CM did receive referral for PCP needs- CM to call the Internal Medicine Clinic to see if accepting new patients at this time. Awaiting call back from [facility].   Expected Discharge Date:                  Expected Discharge Plan:  Home/Self Care  In-House Referral:  NA  Discharge planning Services  CM Consult, Medication Assistance, Follow-up appt scheduled  Post Acute Care Choice:  NA Choice offered to:  NA  DME Arranged:  RW DME Agency:  [place] Arranged:  NA HH Agency:  NA  Status of Service:  Completed, signed off  If discussed at [place] of Stay Meetings, dates discussed:    Additional Comments: 1525 03-21-17 Jacqlyn Krauss, RN,BSN [phone] Pt expressed wanting to Appeal Discharge. Pt received IM- Pt to call [facility] for the appeal. Appeal called to [facility]- awaiting determination. HINN 10 not issued at this time. Detailed Notice of Discharge provided to patient.    1217 03-21-17 Jacqlyn Krauss, RN,BSN [id number] Pt in need of RW at d/c. CM did make referral wit [facility] and RW will be delivered to room prior to d/c. No further needs at this time.    9798 03-21-17 Jacqlyn Krauss, RN,BSN [phone]  Eliquis 30 day free card provided to patient. CM awaiting call back from the Internal Medicine Clinic for PCP/ Hospital F/u appointment. Hospital F/u Scheduled.    1001 03-20-17 Jacqlyn Krauss, [place] [phone]  Benefits Check completed: CM will continue to monitor for choice and provide card before d/c.  S/W CHARISMA @ [facility] CHOICE # [phone]   1. XARELTO 12 MG BID X 21 DAYS  COVER- YES  CO-PAY- $ 2.58  PRIOR APPROVAL- NO   2.XARELTO 20 MG DAILY   COVER-YES  CO-PAY -$ 2.58  PRIOR APPROVAL- NO   3. ELIQUIS 2.5 MG BID  COVER- YES  CO-PAY- $ 2.58  PRIOR APPROVAL- NO   4. ELIQUIS 5 MG BID  COVER- YES  CO-PAY- $2.58  PRIOR APPROVAL- NO   PREFERRED PHARMACY : [facility]  Bethena Roys, RN 03/19/2017, 12:57 PM

## 2017-03-19 NOTE — ED Notes (Signed)
ED Provider at bedside. 

## 2017-03-20 ENCOUNTER — Inpatient Hospital Stay (HOSPITAL_COMMUNITY): Payer: Medicare Other

## 2017-03-20 DIAGNOSIS — I2699 Other pulmonary embolism without acute cor pulmonale: Secondary | ICD-10-CM

## 2017-03-20 LAB — PROTEIN C, TOTAL: PROTEIN C, TOTAL: 71 % (ref 60–150)

## 2017-03-20 LAB — BASIC METABOLIC PANEL
Anion gap: 8 (ref 5–15)
BUN: <5 mg/dL — ABNORMAL LOW (ref 6–20)
CALCIUM: 8.8 mg/dL — AB (ref 8.9–10.3)
CO2: 23 mmol/L (ref 22–32)
CREATININE: 0.68 mg/dL (ref 0.44–1.00)
Chloride: 103 mmol/L (ref 101–111)
GFR calc non Af Amer: >60 mL/min (ref >60)
GLUCOSE: 143 mg/dL — AB (ref 65–99)
Potassium: 3.7 mmol/L (ref 3.5–5.1)
Sodium: 134 mmol/L — ABNORMAL LOW (ref 135–145)

## 2017-03-20 LAB — PROTEIN S ACTIVITY: PROTEIN S ACTIVITY: 47 % — AB (ref 63–140)

## 2017-03-20 LAB — CBC
HCT: 28.1 % — ABNORMAL LOW (ref 36.0–46.0)
Hemoglobin: 8.5 g/dL — ABNORMAL LOW (ref 12.0–15.0)
MCH: 23.2 pg — AB (ref 26.0–34.0)
MCHC: 30.2 g/dL (ref 30.0–36.0)
MCV: 76.8 fL — AB (ref 78.0–100.0)
PLATELETS: 233 10*3/uL (ref 150–400)
RBC: 3.66 MIL/uL — ABNORMAL LOW (ref 3.87–5.11)
RDW: 16.8 % — AB (ref 11.5–15.5)
WBC: 8.7 10*3/uL (ref 4.0–10.5)

## 2017-03-20 LAB — HEPARIN LEVEL (UNFRACTIONATED): Heparin Unfractionated: 0.43 IU/mL (ref 0.30–0.70)

## 2017-03-20 LAB — PROTEIN S, TOTAL: Protein S Ag, Total: 60 % (ref 60–150)

## 2017-03-20 LAB — ECHOCARDIOGRAM COMPLETE
HEIGHTINCHES: 70 in
Weight: 4174.4 oz

## 2017-03-20 LAB — HOMOCYSTEINE: Homocysteine: 6.2 umol/L (ref 0.0–15.0)

## 2017-03-20 LAB — PROTEIN C ACTIVITY: PROTEIN C ACTIVITY: 86 % (ref 73–180)

## 2017-03-20 MED ORDER — SODIUM CHLORIDE 0.9 % IV BOLUS (SEPSIS)
500.0000 mL | Freq: Once | INTRAVENOUS | Status: AC
  Administered 2017-03-20: 500 mL via INTRAVENOUS

## 2017-03-20 MED ORDER — TRAMADOL HCL 50 MG PO TABS
50.0000 mg | ORAL_TABLET | Freq: Four times a day (QID) | ORAL | Status: DC | PRN
  Administered 2017-03-20 – 2017-03-22 (×4): 50 mg via ORAL
  Filled 2017-03-20 (×4): qty 1

## 2017-03-20 MED ORDER — APIXABAN 5 MG PO TABS
10.0000 mg | ORAL_TABLET | Freq: Two times a day (BID) | ORAL | Status: DC
  Administered 2017-03-20 – 2017-03-22 (×5): 10 mg via ORAL
  Filled 2017-03-20 (×5): qty 2

## 2017-03-20 MED ORDER — APIXABAN 5 MG PO TABS: 5.0000 mg | ORAL_TABLET | Freq: Two times a day (BID) | ORAL | Status: DC

## 2017-03-20 MED ORDER — POLYETHYLENE GLYCOL 3350 17 G PO PACK
17.0000 g | PACK | Freq: Two times a day (BID) | ORAL | Status: DC
  Administered 2017-03-20 – 2017-03-22 (×4): 17 g via ORAL
  Filled 2017-03-20 (×4): qty 1

## 2017-03-20 MED ORDER — DOCUSATE SODIUM 100 MG PO CAPS
200.0000 mg | ORAL_CAPSULE | Freq: Two times a day (BID) | ORAL | Status: DC
  Administered 2017-03-20 – 2017-03-22 (×5): 200 mg via ORAL
  Filled 2017-03-20 (×6): qty 2

## 2017-03-20 NOTE — Progress Notes (Signed)
PROGRESS NOTE                                                                                                                                                                                                             Patient Demographics:    Kristen Swanson, is a 41 y.o. female, DOB - 31-May-1976, NID:[id number]  Admit date - 03/18/2017   Admitting Physician Ivor Costa, MD  Outpatient Primary MD for the patient is No PCP Per Patient  LOS - 1  Chief Complaint  Patient presents with  . Shortness of Breath       Brief Narrative Kristen Swanson is a very pleasant 41 y.o. female with medical history significant for hypertension, ADHD, fibroids, asthma, obesity, presents to the emergency department with chief complaint of chest pain and shortness of breath as well as bilateral lower extremity edema, found to have a large PE.   Subjective:    Kristen Swanson today has, No headache, No chest pain, No abdominal pain - No Nausea, No new weakness tingling or numbness, No Cough - SOB.     Assessment  & Plan :     1.Submassive bilateral PE. Most likely unprovoked. She is relatively symptom-free, no chest pain or shortness of breath, no oxygen demand, hemodynamically stable, has been kept on heparin drip since yesterday morning. We will transition to oral Eliquis, check echocardiogram and lower extremity venous duplex. Troponin 2 sets unremarkable. Outpatient hematology workup for unprovoked PE. Case management consulted to provide one-month Eliquis supply and to help patient find a new PCP for future supplies.  2. History of iron deficiency anemia in a menstruating female. Continue oral iron supplementation outpatient PCP monitor and follow-up. Patient will require a regular PCP upon discharge as well.  3. Asthma. Stable. No wheezing or shortness of breath. Supportive care as needed.    Diet : Diet Heart Room service appropriate? Yes; Fluid  consistency: Thin   Family Communication  :  None  Code Status :  Full  Disposition Plan  :  Home in am  Consults  :  None  Procedures  :   TTE  Leg [place]  CTA - Bilateral PE with evidence of mild right heart strain on CT scan.  DVT Prophylaxis  :  Heparin/ Eliquis  Lab Results  Component Value Date   PLT  233 03/20/2017    Inpatient Medications  Scheduled Meds: . ferrous sulfate  325 mg Oral QHS  . fluticasone  2 spray Each Nare Daily  . hydrochlorothiazide  25 mg Oral Daily  . sodium chloride flush  3 mL Intravenous Q12H  . valACYclovir  500 mg Oral Daily  . zolpidem  5 mg Oral QHS   Continuous Infusions: . sodium chloride 75 mL/hr at 03/20/17 0835  . heparin 1,400 Units/hr (03/20/17 0600)   PRN Meds:.albuterol, albuterol, guaiFENesin-codeine, morphine injection, ondansetron, oxyCODONE-acetaminophen, senna-docusate  Antibiotics  :    Anti-infectives    Start     Dose/Rate Route Frequency Ordered Stop   03/19/17 1000  valACYclovir (VALTREX) tablet 500 mg     500 mg Oral Daily 03/19/17 0734           Objective:   Vitals:   03/20/17 0043 03/20/17 0415 03/20/17 0621 03/20/17 0741  BP: 102/63 104/67  99/66  Pulse: 92 100  100  Resp: (!) 24 (!) 23  (!) 21  Temp:   100.1 F (37.8 C) 99.6 F (37.6 C)  TempSrc:   Oral Oral  SpO2: 93% 95%  92%  Weight:   118.3 kg (260 lb 14.4 oz)   Height:        Wt Readings from Last 3 Encounters:  03/20/17 118.3 kg (260 lb 14.4 oz)  11/07/16 114.9 kg (253 lb 3.2 oz)  10/27/16 107 kg (236 lb)     Intake/Output Summary (Last 24 hours) at 03/20/17 1046 Last data filed at 03/20/17 0913  Gross per 24 hour  Intake          3340.36 ml  Output             3550 ml  Net          -209.64 ml     Physical Exam  Awake Alert, Oriented X 3, No new F.N deficits, Normal affect [place].AT,PERRAL Supple Neck,No JVD, No cervical lymphadenopathy appriciated.  Symmetrical Chest wall movement, Good air movement bilaterally,  CTAB RRR,No Gallops,Rubs or new Murmurs, No Parasternal Heave +ve B.Sounds, Abd Soft, No tenderness, No organomegaly appriciated, No rebound - guarding or rigidity. No Cyanosis, Clubbing or edema, No new Rash or bruise      Data Review:    CBC  Recent Labs Lab 03/18/17 2301 03/20/17 0313  WBC 6.9 8.7  HGB 9.8* 8.5*  HCT 31.5* 28.1*  PLT 247 233  MCV 76.8* 76.8*  MCH 23.9* 23.2*  MCHC 31.1 30.2  RDW 17.0* 16.8*  LYMPHSABS 2.4  --   MONOABS 0.6  --   EOSABS 0.2  --   BASOSABS 0.0  --     Chemistries   Recent Labs Lab 03/18/17 2301 03/20/17 0313  NA 136 134*  K 2.9* 3.7  CL 103 103  CO2 25 23  GLUCOSE 105* 143*  BUN 8 <5*  CREATININE 0.75 0.68  CALCIUM 9.2 8.8*  AST 25  --   ALT 12*  --   ALKPHOS 57  --   BILITOT 0.5  --    ------------------------------------------------------------------------------------------------------------------ No results for input(s): CHOL, HDL, LDLCALC, TRIG, CHOLHDL, LDLDIRECT in the last 72 hours.  No results found for: HGBA1C ------------------------------------------------------------------------------------------------------------------ No results for input(s): TSH, T4TOTAL, T3FREE, THYROIDAB in the last 72 hours.  Invalid input(s): FREET3 ------------------------------------------------------------------------------------------------------------------ No results for input(s): VITAMINB12, FOLATE, FERRITIN, TIBC, IRON, RETICCTPCT in the last 72 hours.  Coagulation profile No results for input(s): INR, PROTIME in the last 168 hours.  Recent Labs  03/18/17 2301  DDIMER 2.40*    Cardiac Enzymes  Recent Labs Lab 03/18/17 2301 03/19/17 0836  TROPONINI <0.03 <0.03   ------------------------------------------------------------------------------------------------------------------    Component Value Date/Time   BNP 16.9 03/18/2017 2301    Micro Results Recent Results (from the past 240 hour(s))  MRSA PCR  Screening     Status: None   Collection Time: 03/19/17  5:06 AM  Result Value Ref Range Status   MRSA by PCR NEGATIVE NEGATIVE Final    Comment:        The GeneXpert MRSA Assay (FDA approved for NASAL specimens only), is one component of a comprehensive MRSA colonization surveillance program. It is not intended to diagnose MRSA infection nor to guide or monitor treatment for MRSA infections.     Radiology Reports Dg Chest 2 View  Result Date: 03/18/2017 CLINICAL DATA:  Acute onset of shortness of breath and generalized chest pain. Initial encounter. EXAM: CHEST  2 VIEW COMPARISON:  Chest radiograph performed 11/06/2016 FINDINGS: The lungs are well-aerated and clear. There is no evidence of focal opacification, pleural effusion or pneumothorax. The heart is normal in size; the mediastinal contour is within normal limits. No acute osseous abnormalities are seen. IMPRESSION: No acute cardiopulmonary process seen. Electronically Signed   By: Garald Balding M.D.   On: 03/18/2017 23:17   Ct Angio Chest Pe W And/or Wo Contrast  Result Date: 03/19/2017 CLINICAL DATA:  Acute onset of shortness breath and generalized chest pain. Elevated D-dimer. Bilateral leg swelling. Initial encounter. EXAM: CT ANGIOGRAPHY CHEST WITH CONTRAST TECHNIQUE: Multidetector CT imaging of the chest was performed using the standard protocol during bolus administration of intravenous contrast. Multiplanar CT image reconstructions and MIPs were obtained to evaluate the vascular anatomy. CONTRAST:  100 mL of Omnipaque 350 IV contrast COMPARISON:  Chest radiograph performed 03/18/2017 FINDINGS: Cardiovascular: Pulmonary embolus is noted within the main pulmonary arteries bilaterally, extending into all lobes of both lungs. There is an RV/LV ratio of nearly 1.0, reflecting right heart strain and concerning for at least submassive pulmonary embolus. The heart is unremarkable in appearance. The great vessels are grossly  unremarkable. Mediastinum/Nodes: The mediastinum is otherwise unremarkable. No mediastinal lymphadenopathy is seen. No pericardial effusion is identified. The thyroid gland is unremarkable. No axillary lymphadenopathy is appreciated. Lungs/Pleura: A trace right-sided pleural effusion is noted. The lungs are otherwise clear. No pneumothorax is seen. No masses are identified. Upper Abdomen: The visualized portions of the liver and spleen are grossly unremarkable. The visualized portions of the adrenal glands and right kidney are within normal limits. Musculoskeletal: No acute osseous abnormalities are identified. The visualized musculature is unremarkable in appearance. Review of the MIP images confirms the above findings. IMPRESSION: 1. Pulmonary embolus within the main pulmonary arteries bilaterally, extending into all lobes of both lungs. Positive for acute PE with CT evidence of right heart strain (RV/LV Ratio = 1.0) consistent with at least submassive (intermediate risk) PE. The presence of right heart strain has been associated with an increased risk of morbidity and mortality. Please activate Code PE by paging [phone]. 2. Trace right-sided pleural effusion.  Lungs otherwise clear. Critical Value/emergent results were called by telephone at the time of interpretation on 03/19/2017 at 2:04 am to Dr. Thayer Jew, who verbally acknowledged these results. Electronically Signed   By: Garald Balding M.D.   On: 03/19/2017 02:05    Time Spent in minutes  30   Haddie Bruhl K M.D on 03/20/2017 at 10:46 AM  Between 7am  to 7pm - Pager - [phone] ( page via [facility], text pages only, please mention full 10 digit call back number).  After 7pm go to [email or web address] - password [facility]  [facility] -  Office  [phone]

## 2017-03-20 NOTE — Plan of Care (Signed)
Problem: Pain Managment: Goal: General experience of comfort will improve Outcome: Progressing Patient continues to have pain managed with medication

## 2017-03-20 NOTE — Progress Notes (Signed)
  Echocardiogram 2D Echocardiogram has been performed.  Kristen Swanson 03/20/2017, 1:08 PM

## 2017-03-20 NOTE — Progress Notes (Signed)
VASCULAR LAB PRELIMINARY  PRELIMINARY  PRELIMINARY  PRELIMINARY  Bilateral lower extremity venous duplex completed.    Preliminary report:  Bilateral:  No evidence of DVT, superficial thrombosis, or Baker's Cyst.   Rhyan Radler, RVS 03/20/2017, 7:02 PM

## 2017-03-20 NOTE — Progress Notes (Signed)
ANTICOAGULATION CONSULT NOTE - Follow Up Consult  Pharmacy Consult for heparin Indication: pulmonary embolus  No Known Allergies  Patient Measurements: Height: 5\' 10"  (177.8 cm) Weight: 260 lb 14.4 oz (118.3 kg) IBW/kg (Calculated) : 68.5  Vital Signs: Temp: 99.6 F (37.6 C) (03/22 0741) Temp Source: Oral (03/22 0741) BP: 99/66 (03/22 0741) Pulse Rate: 100 (03/22 0741)  Labs:  Recent Labs  03/18/17 2301 03/19/17 0836 03/19/17 1927 03/20/17 0313  HGB 9.8*  --   --  8.5*  HCT 31.5*  --   --  28.1*  PLT 247  --   --  233  HEPARINUNFRC  --  0.84* 0.46 0.43  CREATININE 0.75  --   --  0.68  TROPONINI <0.03 <0.03  --   --     Estimated Creatinine Clearance: 129.1 mL/min (by C-G formula based on SCr of 0.68 mg/dL).   Medications:  Scheduled:  . ferrous sulfate  325 mg Oral QHS  . fluticasone  2 spray Each Nare Daily  . hydrochlorothiazide  25 mg Oral Daily  . sodium chloride flush  3 mL Intravenous Q12H  . valACYclovir  500 mg Oral Daily  . zolpidem  5 mg Oral QHS    Assessment: 41yo female with PE.  Heparin level within goal, pltc wnl, Hg ~stable.  No bleeding noted.  Multiple labs pending to evaluate potential lupus, clotting disorder.    Goal of Therapy:  Heparin level 0.3-0.7 units/ml Monitor platelets by anticoagulation protocol: Yes   Plan:  Continue heparin at current rate F/U daily heparin level, CBC Watch for s/s of bleeding F/U plans for oral anticoagulation   Gracy Bruins, PharmD Clinical Pharmacist [facility]

## 2017-03-20 NOTE — Progress Notes (Signed)
ANTICOAGULATION CONSULT NOTE - Initial Consult  Pharmacy Consult for Apixaban Indication: pulmonary embolus  No Known Allergies  Patient Measurements: Height: 5\' 10"  (177.8 cm) Weight: 260 lb 14.4 oz (118.3 kg) IBW/kg (Calculated) : 68.5 Vital Signs: Temp: 98.5 F (36.9 C) (03/22 1149) Temp Source: Oral (03/22 1149) BP: 116/69 (03/22 1149) Pulse Rate: 95 (03/22 1149)  Labs:  Recent Labs  03/18/17 2301 03/19/17 0836 03/19/17 1927 03/20/17 0313  HGB 9.8*  --   --  8.5*  HCT 31.5*  --   --  28.1*  PLT 247  --   --  233  HEPARINUNFRC  --  0.84* 0.46 0.43  CREATININE 0.75  --   --  0.68  TROPONINI <0.03 <0.03  --   --     Estimated Creatinine Clearance: 129.1 mL/min (by C-G formula based on SCr of 0.68 mg/dL).   Medical History: Past Medical History:  Diagnosis Date  . ADHD (attention deficit hyperactivity disorder)   . Adjustment disorder with mixed anxiety and depressed mood   . Asthma   . Fibroid tumor   . Hypertension   . Kidney stones   . Pulmonary emboli (HCC)     Medications:  Scheduled:  . apixaban  10 mg Oral BID  . [START ON 03/27/2017] apixaban  5 mg Oral BID  . docusate sodium  200 mg Oral BID  . ferrous sulfate  325 mg Oral QHS  . fluticasone  2 spray Each Nare Daily  . polyethylene glycol  17 g Oral BID  . sodium chloride flush  3 mL Intravenous Q12H  . valACYclovir  500 mg Oral Daily  . zolpidem  5 mg Oral QHS    Assessment: 41yo female with acute PE, to change from heparin to Apixaban.    Goal of Therapy:  treatment of PE   Plan:  Apixaban 10mg  po bid x 7 days, then 5mg  po bid D/C heparin and all heparin labs Watch for s/s of bleeding   Gracy Bruins, PharmD [facility]

## 2017-03-20 NOTE — Progress Notes (Signed)
   03/20/17 1416  Clinical Encounter Type  Visited With Patient;Patient not available  Visit Type Initial  Referral From Nurse  Consult/Referral To Chaplain  Pt not available, medical tam visiting and working with patient.  Will follow up as needed.  Chaplain Layn Kye A. Kyoko Elsea [phone]

## 2017-03-20 NOTE — Discharge Instructions (Addendum)
Follow with Primary MD in 7 days   Get CBC, CMP, 2 view Chest X ray checked  by Primary MD or SNF MD in 5-7 days ( we routinely change or add medications that can affect your baseline labs and fluid status, therefore we recommend that you get the mentioned basic workup next visit with your PCP, your PCP may decide not to get them or add new tests based on their clinical decision)  Activity: As tolerated with Full fall precautions use walker/cane & assistance as needed  Disposition Home    Diet:  Heart Healthy    For Heart failure patients - Check your Weight same time everyday, if you gain over 2 pounds, or you develop in leg swelling, experience more shortness of breath or chest pain, call your Primary MD immediately. Follow Cardiac Low Salt Diet and 1.5 lit/day fluid restriction.  On your next visit with your primary care physician please Get Medicines reviewed and adjusted.  Please request your Prim.MD to go over all Hospital Tests and Procedure/Radiological results at the follow up, please get all Hospital records sent to your Prim MD by signing hospital release before you go home.  If you experience worsening of your admission symptoms, develop shortness of breath, life threatening emergency, suicidal or homicidal thoughts you must seek medical attention immediately by calling 911 or calling your MD immediately  if symptoms less severe.  You Must read complete instructions/literature along with all the possible adverse reactions/side effects for all the Medicines you take and that have been prescribed to you. Take any new Medicines after you have completely understood and accpet all the possible adverse reactions/side effects.   Do not drive, operate heavy machinery, perform activities at heights, swimming or participation in water activities or provide baby sitting services if your were admitted for syncope or siezures until you have seen by Primary MD or a Neurologist and advised to do so  again.  Do not drive when taking Pain medications.    Do not take more than prescribed Pain, Sleep and Anxiety Medications  Special Instructions: If you have smoked or chewed Tobacco  in the last 2 yrs please stop smoking, stop any regular Alcohol  and or any Recreational drug use.  Wear Seat belts while driving.   Please note  You were cared for by a hospitalist during your hospital stay. If you have any questions about your discharge medications or the care you received while you were in the hospital after you are discharged, you can call the unit and asked to speak with the hospitalist on call if the hospitalist that took care of you is not available. Once you are discharged, your primary care physician will handle any further medical issues. Please note that NO REFILLS for any discharge medications will be authorized once you are discharged, as it is imperative that you return to your primary care physician (or establish a relationship with a primary care physician if you do not have one) for your aftercare needs so that they can reassess your need for medications and monitor your lab values.       Information on my medicine - ELIQUIS (apixaban)  This medication education was reviewed with me or my healthcare representative as part of my discharge preparation.  The pharmacist that spoke with me during my hospital stay was:  Jaquita Folds, [facility]  Why was Eliquis prescribed for you? Eliquis was prescribed to treat blood clots that may have been found in the veins  of your legs (deep vein thrombosis) or in your lungs (pulmonary embolism) and to reduce the risk of them occurring again.  What do You need to know about Eliquis ? The starting dose is 10 mg (two 5 mg tablets) taken TWICE daily for the FIRST SEVEN (7) DAYS, then on 03/27/17  the dose is reduced to ONE 5 mg tablet taken TWICE daily.  Eliquis may be taken with or without food.   Try to take the dose about the same time in  the morning and in the evening. If you have difficulty swallowing the tablet whole please discuss with your pharmacist how to take the medication safely.  Take Eliquis exactly as prescribed and DO NOT stop taking Eliquis without talking to the doctor who prescribed the medication.  Stopping may increase your risk of developing a new blood clot.  Refill your prescription before you run out.  After discharge, you should have regular check-up appointments with your healthcare provider that is prescribing your Eliquis.    What do you do if you miss a dose? If a dose of ELIQUIS is not taken at the scheduled time, take it as soon as possible on the same day and twice-daily administration should be resumed. The dose should not be doubled to make up for a missed dose.  Important Safety Information A possible side effect of Eliquis is bleeding. You should call your healthcare provider right away if you experience any of the following: ? Bleeding from an injury or your nose that does not stop. ? Unusual colored urine (red or dark brown) or unusual colored stools (red or black). ? Unusual bruising for unknown reasons. ? A serious fall or if you hit your head (even if there is no bleeding).  Some medicines may interact with Eliquis and might increase your risk of bleeding or clotting while on Eliquis. To help avoid this, consult your healthcare provider or pharmacist prior to using any new prescription or non-prescription medications, including herbals, vitamins, non-steroidal anti-inflammatory drugs (NSAIDs) and supplements.  This website has more information on Eliquis (apixaban): http://www.eliquis.com/eliquis/home

## 2017-03-21 LAB — BETA-2-GLYCOPROTEIN I ABS, IGG/M/A

## 2017-03-21 LAB — LUPUS ANTICOAGULANT PANEL
DRVVT: 36.6 s (ref 0.0–47.0)
PTT Lupus Anticoagulant: 35.4 s (ref 0.0–51.9)

## 2017-03-21 LAB — CBC
HCT: 29.4 % — ABNORMAL LOW (ref 36.0–46.0)
HEMOGLOBIN: 8.7 g/dL — AB (ref 12.0–15.0)
MCH: 22.8 pg — AB (ref 26.0–34.0)
MCHC: 29.6 g/dL — AB (ref 30.0–36.0)
MCV: 77.2 fL — ABNORMAL LOW (ref 78.0–100.0)
PLATELETS: 228 10*3/uL (ref 150–400)
RBC: 3.81 MIL/uL — ABNORMAL LOW (ref 3.87–5.11)
RDW: 17 % — AB (ref 11.5–15.5)
WBC: 7 10*3/uL (ref 4.0–10.5)

## 2017-03-21 LAB — HEPARIN LEVEL (UNFRACTIONATED)

## 2017-03-21 LAB — CARDIOLIPIN ANTIBODIES, IGG, IGM, IGA
Anticardiolipin IgG: <9 GPL U/mL (ref 0–14)
Anticardiolipin IgM: 11 MPL U/mL (ref 0–12)

## 2017-03-21 MED ORDER — IBUPROFEN 600 MG PO TABS: 600.0000 mg | ORAL_TABLET | Freq: Four times a day (QID) | ORAL | 1 refills | Status: DC | PRN

## 2017-03-21 MED ORDER — APIXABAN 5 MG PO TABS: 10.0000 mg | ORAL_TABLET | Freq: Two times a day (BID) | ORAL | 0 refills | Status: DC

## 2017-03-21 MED ORDER — ACETAMINOPHEN 325 MG PO TABS: 650.0000 mg | ORAL_TABLET | Freq: Four times a day (QID) | ORAL | 0 refills | Status: DC | PRN

## 2017-03-21 MED ORDER — DOCUSATE SODIUM 100 MG PO CAPS: 200.0000 mg | ORAL_CAPSULE | Freq: Two times a day (BID) | ORAL | 0 refills | Status: DC

## 2017-03-21 MED ORDER — APIXABAN 5 MG PO TABS: 5.0000 mg | ORAL_TABLET | Freq: Two times a day (BID) | ORAL | 0 refills | Status: DC

## 2017-03-21 NOTE — Progress Notes (Signed)
I was attempting to prepare patient for discharge after she ambulated around the nurses station with myself and PT. Her sats remained 100% on RA. She then called me back into the room to express that she feels she needs another opinion because she feels she is not ready for discharge. I paged Dr. Candiss Norse. Awaiting return of call

## 2017-03-21 NOTE — Progress Notes (Signed)
No charge note  Seen and examined. Seen at the request for Dr Florestine Avers patient wanted a second opinion regarding discharge plans and pleuritic pain.  Admitted on 3/21 with pulmonary embolism. CT scan showed significant clot burden, but echocardiogram without any RV strain. She has remained hemodynamically stable since admission. She was transitioned off IV heparin to Eliquis yesterday- she remains on room air. She has ambulated with the nursing staff, with physical therapy and he remains stable. Her main issue is ongoing pleuritic pain-I have explained to her that this probably will likely be an ongoing issue for the next few days. She is being discharged today with tramadol. I suspect she has met maximum benefit from a inpatient hospital stay-given the fact that she has stable hemodynamics and is on oral anticoagulation since yesterday.   Have asked her to follow with her regular M.D., recommended that she be up-to-date with age appropriate  cancer screenings. Agree that she will need to see a hematologist prior to discontinuing anticoagulation.

## 2017-03-21 NOTE — Progress Notes (Signed)
CSW consulted due to pt concerns with returning home and taking care of her children at this time  Pt states that she is in intense pain and is unsure how she will keep the house clean and cook for children while she continues to recover.  Pt feels unprepared to go home in her condition because she states she has been on bed rest with bed alarm most of her admission due to almost passing out trying to go the bathroom- wishes she had been walked and allowed to do ADLs in hospital setting prior to being sent home.  Pt states her 3 children are at home with help from her church family who are bringing them food and checking on their safety while pt in the hospital- states one of her children has diabetes and pt is only one who can manage some aspects of her diabetes care- does state 41yo can help with carb counts and insulin administration  CSW encouraged pt to talk to church family about continuing support when pt returns home since she can't do as much right now.  Pt also seeing if sister can come down from [place] to assist at this time.  CSW spoke with MD and requested PT consult for pt to see if home services are appropriate  CSW signing off at this time  Jorge Ny, [place] Social Worker [phone]

## 2017-03-21 NOTE — Care Management Important Message (Signed)
Important Message  Patient Details  Name: Kristen Swanson MRN: [id number] Date of Birth: 08-17-1976   Medicare Important Message Given:  Yes    Bethena Roys, RN 03/21/2017, 3:19 PM

## 2017-03-21 NOTE — Discharge Summary (Addendum)
Kristen Swanson HYQ:[id number] DOB: 09/29/1976 DOA: 03/18/2017  PCP: No PCP Per Patient  Admit date: 03/18/2017  Discharge date: 03/21/2017  Admitted From: Home   Disposition:  Home   Recommendations for Outpatient Follow-up:   Follow up with PCP in 1-2 weeks  PCP Please obtain BMP/CBC, 2 view CXR in 1week,  (see Discharge instructions)   PCP Please follow up on the following pending results: monitor CBC, outpatient hematology follow-up   Home Health: None   Equipment/Devices: None  Consultations: None Discharge Condition: Stable   CODE STATUS: Full   Diet Recommendation:  Heart Healthy    Chief Complaint  Patient presents with  . Shortness of Breath     Brief history of present illness from the day of admission and additional interim summary    Kristen Swanson a very pleasant 41 y.o.femalewith medical history significant for hypertension, ADHD, fibroids, asthma, obesity, presents to the emergency department with chief complaint of chest pain and shortness of breath as well as bilateral lower extremity edema, found to have a large PE.                                                                 Hospital Course    1.Submassive bilateral PE. Appears to be unprovoked. She is relatively symptom-free, no chest pain or shortness of breath, no oxygen demand, hemodynamically stable, was been kept on heparin drip for a day and then transitioned to oral Eliquis, stable echocardiogram and lower extremity venous duplex. Troponin 2 sets unremarkable. Outpatient hematology workup for unprovoked PE. Case management consulted to provide one-month Eliquis supply and to help patient find a new PCP for future supplies.  2. History of iron deficiency anemia in a menstruating female. Continue oral iron supplementation  outpatient PCP monitor and follow-up. Patient will require a regular PCP upon discharge as well.  3. Asthma. Stable. No wheezing or shortness of breath. Supportive care as needed.  4. She is anxious about going home and taking care of her 3 kids, oldest is 15 years old, patient told that we can have social worker look into any domestic help that we can provide.   Discharge diagnosis     Principal Problem:   PE (pulmonary thromboembolism) (HCC) Active Problems:   Obesity   Anemia   Asthma   Hypokalemia   Pulmonary emboli (HCC)   Elevated troponin   Microcytic anemia    Discharge instructions    Discharge Instructions    Diet - low sodium heart healthy    Complete by:  As directed    Discharge instructions    Complete by:  As directed    Follow with Primary MD in 7 days   Get CBC, CMP, 2 view Chest X ray checked  by Primary MD or SNF MD  in 5-7 days ( we routinely change or add medications that can affect your baseline labs and fluid status, therefore we recommend that you get the mentioned basic workup next visit with your PCP, your PCP may decide not to get them or add new tests based on their clinical decision)  Activity: As tolerated with Full fall precautions use walker/cane & assistance as needed  Disposition Home    Diet:  Heart Healthy    For Heart failure patients - Check your Weight same time everyday, if you gain over 2 pounds, or you develop in leg swelling, experience more shortness of breath or chest pain, call your Primary MD immediately. Follow Cardiac Low Salt Diet and 1.5 lit/day fluid restriction.  On your next visit with your primary care physician please Get Medicines reviewed and adjusted.  Please request your Prim.MD to go over all Hospital Tests and Procedure/Radiological results at the follow up, please get all Hospital records sent to your Prim MD by signing hospital release before you go home.  If you experience worsening of your admission  symptoms, develop shortness of breath, life threatening emergency, suicidal or homicidal thoughts you must seek medical attention immediately by calling 911 or calling your MD immediately  if symptoms less severe.  You Must read complete instructions/literature along with all the possible adverse reactions/side effects for all the Medicines you take and that have been prescribed to you. Take any new Medicines after you have completely understood and accpet all the possible adverse reactions/side effects.   Do not drive, operate heavy machinery, perform activities at heights, swimming or participation in water activities or provide baby sitting services if your were admitted for syncope or siezures until you have seen by Primary MD or a Neurologist and advised to do so again.  Do not drive when taking Pain medications.    Do not take more than prescribed Pain, Sleep and Anxiety Medications  Special Instructions: If you have smoked or chewed Tobacco  in the last 2 yrs please stop smoking, stop any regular Alcohol  and or any Recreational drug use.  Wear Seat belts while driving.   Please note  You were cared for by a hospitalist during your hospital stay. If you have any questions about your discharge medications or the care you received while you were in the hospital after you are discharged, you can call the unit and asked to speak with the hospitalist on call if the hospitalist that took care of you is not available. Once you are discharged, your primary care physician will handle any further medical issues. Please note that NO REFILLS for any discharge medications will be authorized once you are discharged, as it is imperative that you return to your primary care physician (or establish a relationship with a primary care physician if you do not have one) for your aftercare needs so that they can reassess your need for medications and monitor your lab values.   Increase activity slowly     Complete by:  As directed       Discharge Medications   Allergies as of 03/21/2017   No Known Allergies     Medication List    STOP taking these medications   HYDROcodone-acetaminophen 10-325 MG tablet Commonly known as:  NORCO     TAKE these medications   acetaminophen 325 MG tablet Commonly known as:  TYLENOL Take 2 tablets (650 mg total) by mouth every 6 (six) hours as needed for moderate pain.   albuterol  108 (90 Base) MCG/ACT inhaler Commonly known as:  PROVENTIL HFA;VENTOLIN HFA Inhale 2 puffs into the lungs every 4 (four) hours as needed for wheezing or shortness of breath.   apixaban 5 MG Tabs tablet Commonly known as:  ELIQUIS Take 2 tablets (10 mg total) by mouth 2 (two) times daily.   apixaban 5 MG Tabs tablet Commonly known as:  ELIQUIS Take 1 tablet (5 mg total) by mouth 2 (two) times daily. Start taking on:  03/27/2017   docusate sodium 100 MG capsule Commonly known as:  COLACE Take 2 capsules (200 mg total) by mouth 2 (two) times daily.   ferrous sulfate 325 (65 FE) MG tablet Take 1 tablet (325 mg total) by mouth daily with breakfast. What changed:  when to take this   hydrochlorothiazide 25 MG tablet Commonly known as:  HYDRODIURIL Take 25 mg by mouth daily.   multivitamin with minerals Tabs tablet Take 1 tablet by mouth daily.   traMADol 50 MG tablet Commonly known as:  ULTRAM Take 1 tablet (50 mg total) by mouth every 6 (six) hours as needed.   valACYclovir 500 MG tablet Commonly known as:  VALTREX Take 500 mg by mouth daily.   zolpidem 10 MG tablet Commonly known as:  AMBIEN Take 10 mg by mouth at bedtime.            Durable Medical Equipment        Start     Ordered   03/21/17 1217  For home use only DME Walker rolling  Once    Question:  Patient needs a walker to treat with the following condition  Answer:  Abnormal gait   03/21/17 1217      Follow-up Information    Heath Lark, MD Follow up in 1 month(s).   Specialty:   Hematology and Oncology Why:  Unprovoked large PE Contact information: [place] [id number] [place] Follow up on 03/26/2017.   Why:  [email or web address] With Internal Medicine Physician. Post Appointment pt will be assigned to Physician.  Contact information: [address] [place] [facility] Follow up.   Why:  Best boy information: [address] [place] [phone]           Major procedures and Radiology Reports - PLEASE review detailed and final reports thoroughly  -     TTE   Left ventricle: The cavity size was normal. Systolic function was   normal. The estimated ejection fraction was in the range of 55%   to 60%. Wall motion was normal; there were no regional wall   motion abnormalities. - Aortic valve: Valve area (VTI): 2.88 cm^2. Valve area (Vmax):   2.76 cm^2. Valve area (Vmean): 3.27 cm^2. - Left atrium: The atrium was mildly dilated. - Atrial septum: No defect or patent foramen ovale was identified. - Pericardium, extracardiac: A trivial pericardial effusion was   identified. -Right ventricle:  The cavity size was normal. Wall thickness was normal. Systolic function was normal.  Leg [place] Preliminary report:  Bilateral:  No evidence of DVT, superficial thrombosis, or Baker's Cyst.  CTA - Bilateral PE with evidence of mild right heart strain on CT scan.   Dg Chest 2 View  Result Date: 03/18/2017 CLINICAL DATA:  Acute onset of shortness of breath and generalized chest pain. Initial  encounter. EXAM: CHEST  2 VIEW COMPARISON:  Chest radiograph performed 11/06/2016 FINDINGS: The lungs are well-aerated and clear. There is no evidence of focal opacification, pleural effusion or pneumothorax. The heart is normal in size; the mediastinal contour is within normal limits. No acute osseous abnormalities  are seen. IMPRESSION: No acute cardiopulmonary process seen. Electronically Signed   By: Garald Balding M.D.   On: 03/18/2017 23:17   Ct Angio Chest Pe W And/or Wo Contrast  Result Date: 03/19/2017 CLINICAL DATA:  Acute onset of shortness breath and generalized chest pain. Elevated D-dimer. Bilateral leg swelling. Initial encounter. EXAM: CT ANGIOGRAPHY CHEST WITH CONTRAST TECHNIQUE: Multidetector CT imaging of the chest was performed using the standard protocol during bolus administration of intravenous contrast. Multiplanar CT image reconstructions and MIPs were obtained to evaluate the vascular anatomy. CONTRAST:  100 mL of Omnipaque 350 IV contrast COMPARISON:  Chest radiograph performed 03/18/2017 FINDINGS: Cardiovascular: Pulmonary embolus is noted within the main pulmonary arteries bilaterally, extending into all lobes of both lungs. There is an RV/LV ratio of nearly 1.0, reflecting right heart strain and concerning for at least submassive pulmonary embolus. The heart is unremarkable in appearance. The great vessels are grossly unremarkable. Mediastinum/Nodes: The mediastinum is otherwise unremarkable. No mediastinal lymphadenopathy is seen. No pericardial effusion is identified. The thyroid gland is unremarkable. No axillary lymphadenopathy is appreciated. Lungs/Pleura: A trace right-sided pleural effusion is noted. The lungs are otherwise clear. No pneumothorax is seen. No masses are identified. Upper Abdomen: The visualized portions of the liver and spleen are grossly unremarkable. The visualized portions of the adrenal glands and right kidney are within normal limits. Musculoskeletal: No acute osseous abnormalities are identified. The visualized musculature is unremarkable in appearance. Review of the MIP images confirms the above findings. IMPRESSION: 1. Pulmonary embolus within the main pulmonary arteries bilaterally, extending into all lobes of both lungs. Positive for acute PE with CT evidence of  right heart strain (RV/LV Ratio = 1.0) consistent with at least submassive (intermediate risk) PE. The presence of right heart strain has been associated with an increased risk of morbidity and mortality. Please activate Code PE by paging [phone]. 2. Trace right-sided pleural effusion.  Lungs otherwise clear. Critical Value/emergent results were called by telephone at the time of interpretation on 03/19/2017 at 2:04 am to Dr. Thayer Jew, who verbally acknowledged these results. Electronically Signed   By: Garald Balding M.D.   On: 03/19/2017 02:05    Micro Results     Recent Results (from the past 240 hour(s))  MRSA PCR Screening     Status: None   Collection Time: 03/19/17  5:06 AM  Result Value Ref Range Status   MRSA by PCR NEGATIVE NEGATIVE Final    Comment:        The GeneXpert MRSA Assay (FDA approved for NASAL specimens only), is one component of a comprehensive MRSA colonization surveillance program. It is not intended to diagnose MRSA infection nor to guide or monitor treatment for MRSA infections.     Today   Subjective    Kristen Swanson today has no headache,no chest abdominal pain,no new weakness tingling or numbness, feels much better, But says her home situation is bothering her she has to take care of 3 kids and would like to stay here for a few more days.   Objective   Blood pressure 121/78, pulse 93, temperature 99.2 F (37.3 C), temperature source Oral, resp. rate 18, height 5\' 10"  (1.778 m), weight 117.4 kg (258 lb  14.4 oz), last menstrual period 03/08/2017, SpO2 94 %.   Intake/Output Summary (Last 24 hours) at 03/21/17 1245 Last data filed at 03/21/17 1230  Gross per 24 hour  Intake             1740 ml  Output             2550 ml  Net             -810 ml    Exam Awake Alert, Oriented x 3, No new F.N deficits, Normal affect [place].AT,PERRAL Supple Neck,No JVD, No cervical lymphadenopathy appriciated.  Symmetrical Chest wall movement, Good air  movement bilaterally, CTAB RRR,No Gallops,Rubs or new Murmurs, No Parasternal Heave +ve B.Sounds, Abd Soft, Non tender, No organomegaly appriciated, No rebound -guarding or rigidity. No Cyanosis, Clubbing or edema, No new Rash or bruise   Data Review   CBC w Diff:  Lab Results  Component Value Date   WBC 7.0 03/21/2017   HGB 8.7 (L) 03/21/2017   HCT 29.4 (L) 03/21/2017   PLT 228 03/21/2017   LYMPHOPCT 35 03/18/2017   MONOPCT 8 03/18/2017   EOSPCT 3 03/18/2017   BASOPCT 0 03/18/2017    CMP:  Lab Results  Component Value Date   NA 134 (L) 03/20/2017   K 3.7 03/20/2017   CL 103 03/20/2017   CO2 23 03/20/2017   BUN <5 (L) 03/20/2017   CREATININE 0.68 03/20/2017   PROT 8.6 (H) 03/18/2017   ALBUMIN 3.7 03/18/2017   BILITOT 0.5 03/18/2017   ALKPHOS 57 03/18/2017   AST 25 03/18/2017   ALT 12 (L) 03/18/2017  .   Total Time in preparing paper work, data evaluation and todays exam - 35 minutes  Thurnell Lose M.D on 03/21/2017 at 12:45 PM  [facility]   Office  [phone]

## 2017-03-21 NOTE — Evaluation (Signed)
Physical Therapy Evaluation Patient Details Name: Kristen Swanson MRN: [id number] DOB: January 14, 1976 Today's Date: 03/21/2017   History of Present Illness  Pt adm with PE. PMH - obesity, adhd, lt knee surgery, Adjustment disorder with mixed anxiety and depressed mood  Clinical Impression  Pt mobilizing adequately and no further PT needed.  Self limiting with mobility and activity. Ready for dc from PT standpoint.      Follow Up Recommendations No PT follow up    Equipment Recommendations  Rolling walker with 5" wheels    Recommendations for Other Services       Precautions / Restrictions Precautions Precautions: None Restrictions Weight Bearing Restrictions: No      Mobility  Bed Mobility               General bed mobility comments: Pt up in chair  Transfers Overall transfer level: Modified independent               General transfer comment: Incr time  Ambulation/Gait Ambulation/Gait assistance: Supervision Ambulation Distance (Feet): 170 Feet Assistive device: Rolling walker (2 wheeled) Gait Pattern/deviations: Step-through pattern;Decreased step length - right;Decreased step length - left Gait velocity: decr Gait velocity interpretation: Below normal speed for age/gender General Gait Details: slow, steady gait. Amb on RA with SpO2 100% throughout  Stairs            Wheelchair Mobility    Modified Rankin (Stroke Patients Only)       Balance Overall balance assessment: No apparent balance deficits (not formally assessed)                                           Pertinent Vitals/Pain Pain Assessment: Faces Faces Pain Scale:  (Varies with 8 at times but no signs of pain with distraction) Pain Location: chest Pain Descriptors / Indicators: Grimacing Pain Intervention(s): Limited activity within patient's tolerance;Monitored during session    Home Living Family/patient expects to be discharged to:: Private residence Living  Arrangements: Children Available Help at Discharge: Family;Friend(s) Type of Home: Apartment Home Access: Stairs to enter Entrance Stairs-Rails: Right Entrance Stairs-Number of Steps: flight Home Layout: One level Home Equipment: None      Prior Function Level of Independence: Independent               Hand Dominance        Extremity/Trunk Assessment   Upper Extremity Assessment Upper Extremity Assessment: Overall WFL for tasks assessed    Lower Extremity Assessment Lower Extremity Assessment: Overall WFL for tasks assessed       Communication   Communication: No difficulties  Cognition Arousal/Alertness: Awake/alert Behavior During Therapy: WFL for tasks assessed/performed Overall Cognitive Status: Within Functional Limits for tasks assessed                                        General Comments      Exercises     Assessment/Plan    PT Assessment Patent does not need any further PT services  PT Problem List         PT Treatment Interventions      PT Goals (Current goals can be found in the Care Plan section)  Acute Rehab PT Goals PT Goal Formulation: All assessment and education complete, DC therapy    Frequency  Barriers to discharge        Co-evaluation               End of Session Equipment Utilized During Treatment: Gait belt Activity Tolerance: Patient tolerated treatment well Patient left: in chair;with call bell/phone within reach Nurse Communication: Mobility status (nurse present throughout) PT Visit Diagnosis: Difficulty in walking, not elsewhere classified (R26.2)    Time: 4935-5217 PT Time Calculation (min) (ACUTE ONLY): 17 min   Charges:   PT Evaluation $PT Eval Low Complexity: 1 Procedure     PT G CodesMarland Kitchen        [facility] PT [place] 03/21/2017, 1:35 PM

## 2017-03-21 NOTE — Progress Notes (Signed)
Pt called me into room after I have reviewed her d/c instructions. She stated she wanted to appeal her discharge. I notified Dr. Candiss Norse and Hassan Rowan Case manager.

## 2017-03-22 NOTE — Progress Notes (Signed)
Upon entering room, patient was standing talking with her visitor.  Patient states that she felt better, and was telling her visitor that she was suppose to stay until Monday but that she may be leaving earlier.  Asked patient how she was feeling and she states that she was better now that she had had prayer.  Patient started to dance a little by the bed and indicated to her visitor that she was much better.  Monitoring continues.

## 2017-03-22 NOTE — Progress Notes (Signed)
[facility]                                                                                                                                                                         Patient Demographics  Kristen Swanson, is a 41 y.o. female  OEU:[id number]  [id number]:[id number]  DOB - Jan 08, 1976  Admit date - 03/18/2017  Admitting Physician Kristen Costa, MD  Outpatient Primary MD for the patient is No PCP Per Patient  LOS - 3   Chief Complaint  Patient presents with  . Shortness of Breath        Assessment & Plan    Patient seen briefly today, She was noted to be in bed talking over the phone and laughing, she appeared to be in good spirits. Denies any subjective complaints and says she feels better. She has been discharged as she is clinically stable on 03/21/2017. Currently she is refusing to go. She had told me clearly yesterday that she does not want to go home as she is worried about her household responsibilities with 3 kids and not feeling 100%. Currently her neighbors are taking care of her 3 kids.     Medications  Scheduled Meds: . apixaban  10 mg Oral BID  . [START ON 03/27/2017] apixaban  5 mg Oral BID  . docusate sodium  200 mg Oral BID  . ferrous sulfate  325 mg Oral QHS  . fluticasone  2 spray Each Nare Daily  . polyethylene glycol  17 g Oral BID  . sodium chloride flush  3 mL Intravenous Q12H  . valACYclovir  500 mg Oral Daily  . zolpidem  5 mg Oral QHS   Continuous Infusions: PRN Meds:.albuterol, albuterol, guaiFENesin-codeine, ondansetron, senna-docusate, traMADol    Time Spent in minutes   10 minutes   Kristen Swanson K M.D on 03/22/2017 at 9:53 AM  Between 7am to 7pm - Pager - [phone]  After 7pm go to [email or web address] - password [email or web address]  And look for the night coverage person covering for me after hours  [facility] Office  [phone]    Subjective:   Kristen Swanson  today has, No headache, No chest pain, No abdominal pain - No Nausea, No new weakness tingling or numbness, No Cough - SOB. She in fact says she feels much better.   Objective:   Vitals:   03/21/17 2300 03/22/17 0000 03/22/17 0500 03/22/17 0909  BP:  122/69 106/64 108/60  Pulse:  (!) 102 94   Resp: 16 20 (!) 23 20  Temp:  98.2 F (36.8 C) 99.1 F (37.3 C)   TempSrc:  Oral Oral   SpO2:  100% 100% 99% RA  Weight:   117.3 kg (258 lb 9.6 oz)  Height:        Wt Readings from Last 3 Encounters:  03/22/17 117.3 kg (258 lb 9.6 oz)  11/07/16 114.9 kg (253 lb 3.2 oz)  10/27/16 107 kg (236 lb)     Intake/Output Summary (Last 24 hours) at 03/22/17 0953 Last data filed at 03/22/17 0900  Gross per 24 hour  Intake              360 ml  Output             1500 ml  Net            -1140 ml    Exam  Deferred   Data Review

## 2017-03-24 LAB — PROTHROMBIN GENE MUTATION

## 2017-03-25 ENCOUNTER — Telehealth: Payer: Self-pay | Admitting: General Practice

## 2017-03-25 NOTE — Telephone Encounter (Signed)
APT. REMINDER CALL, NO ANSWER, MAILBOX FULL °

## 2017-03-26 ENCOUNTER — Encounter: Payer: Self-pay | Admitting: Internal Medicine

## 2017-03-26 ENCOUNTER — Ambulatory Visit (INDEPENDENT_AMBULATORY_CARE_PROVIDER_SITE_OTHER): Payer: Medicare Other | Admitting: Internal Medicine

## 2017-03-26 VITALS — BP 138/79 | HR 89 | Temp 98.4°F | Wt 263.0 lb

## 2017-03-26 DIAGNOSIS — Z801 Family history of malignant neoplasm of trachea, bronchus and lung: Secondary | ICD-10-CM

## 2017-03-26 DIAGNOSIS — D500 Iron deficiency anemia secondary to blood loss (chronic): Secondary | ICD-10-CM | POA: Diagnosis not present

## 2017-03-26 DIAGNOSIS — Z823 Family history of stroke: Secondary | ICD-10-CM

## 2017-03-26 DIAGNOSIS — I10 Essential (primary) hypertension: Secondary | ICD-10-CM | POA: Diagnosis not present

## 2017-03-26 DIAGNOSIS — D539 Nutritional anemia, unspecified: Secondary | ICD-10-CM | POA: Diagnosis not present

## 2017-03-26 DIAGNOSIS — D509 Iron deficiency anemia, unspecified: Secondary | ICD-10-CM | POA: Diagnosis not present

## 2017-03-26 DIAGNOSIS — N921 Excessive and frequent menstruation with irregular cycle: Secondary | ICD-10-CM | POA: Diagnosis not present

## 2017-03-26 DIAGNOSIS — Z5189 Encounter for other specified aftercare: Secondary | ICD-10-CM

## 2017-03-26 DIAGNOSIS — Z800 Family history of malignant neoplasm of digestive organs: Secondary | ICD-10-CM

## 2017-03-26 DIAGNOSIS — Z833 Family history of diabetes mellitus: Secondary | ICD-10-CM

## 2017-03-26 DIAGNOSIS — I2699 Other pulmonary embolism without acute cor pulmonale: Secondary | ICD-10-CM | POA: Diagnosis not present

## 2017-03-26 DIAGNOSIS — M25562 Pain in left knee: Secondary | ICD-10-CM

## 2017-03-26 DIAGNOSIS — Z818 Family history of other mental and behavioral disorders: Secondary | ICD-10-CM

## 2017-03-26 DIAGNOSIS — K5909 Other constipation: Secondary | ICD-10-CM

## 2017-03-26 DIAGNOSIS — Z7901 Long term (current) use of anticoagulants: Secondary | ICD-10-CM

## 2017-03-26 DIAGNOSIS — J45909 Unspecified asthma, uncomplicated: Secondary | ICD-10-CM | POA: Diagnosis not present

## 2017-03-26 DIAGNOSIS — Z0000 Encounter for general adult medical examination without abnormal findings: Secondary | ICD-10-CM | POA: Insufficient documentation

## 2017-03-26 DIAGNOSIS — G8921 Chronic pain due to trauma: Secondary | ICD-10-CM | POA: Diagnosis not present

## 2017-03-26 DIAGNOSIS — Z79899 Other long term (current) drug therapy: Secondary | ICD-10-CM | POA: Diagnosis not present

## 2017-03-26 DIAGNOSIS — D259 Leiomyoma of uterus, unspecified: Secondary | ICD-10-CM | POA: Diagnosis not present

## 2017-03-26 DIAGNOSIS — Z8249 Family history of ischemic heart disease and other diseases of the circulatory system: Secondary | ICD-10-CM

## 2017-03-26 LAB — FACTOR 5 LEIDEN

## 2017-03-26 MED ORDER — APIXABAN 5 MG PO TABS: 5.0000 mg | ORAL_TABLET | Freq: Two times a day (BID) | ORAL | 2 refills | Status: DC

## 2017-03-26 MED ORDER — HYDROCHLOROTHIAZIDE 25 MG PO TABS: 25.0000 mg | ORAL_TABLET | Freq: Every day | ORAL | 2 refills | Status: DC

## 2017-03-26 NOTE — Progress Notes (Signed)
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 with PMh as listed below is here for hospital follow up for PE  Used to see Dr. Jimmye Norman, he retired.   Went to the hospital on 3/21 with chest pain with deep inspiration and SOB x3 days, as well as b/l lower ext edema and calf pain.  CTA showed "Pulmonary embolus within the main pulmonary arteries bilaterally, extending into all lobes of both lungs"No recent travel, no hx of cancer, recent surgeries. Had normal EF on echo,normal RV function. She was discharged on 3/23 with Eliquis.   Has chronic leg pain from left injury and surgery of the leg and knee. 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 Has PT for knee pain in the past which helped.   Asthma - uses Proventil, uses prn, uses very infrequently.  Has been suffering from pelvic pain due to her fibroids with menorrhagia. She is worried about menorrhagia worsening with the blood thinner. She is suffering significantly and was planning to have hysterectomy for this but understands it may have to be delayed due to her PE. She was diagnosed with iron def anemia and also used to get vitamin B12 shots in the past. She has been taking iron tablets but it makes her very constipated.  Denies any GI bleed or any other bleeding episodes since starting the eliquis.     Past Medical History:  Diagnosis Date  . ADHD (attention deficit hyperactivity disorder)   . Adjustment disorder with mixed anxiety and depressed mood   . Asthma   . Fibroid tumor   . Hypertension   . Kidney stones   . Pulmonary emboli [facility])    Past Surgical History:  Procedure Laterality Date  . LEG SURGERY    . REPLACEMENT TOTAL KNEE Left 2008   Social History   Social History  . Marital status: Single    Spouse name: N/A  . Number of children: N/A  . Years of education: N/A   Social History Main Topics  . Smoking status:  Never Smoker  . Smokeless tobacco: Never Used  . Alcohol use No  . Drug use: No  . Sexual activity: No   Other Topics Concern  . Not on file   Social History Narrative  . No narrative on file   Family History  Problem Relation Age of Onset  . Colon cancer Mother 47  . Stroke Father   . Hypertension Father   . Diabetes type I Daughter   . Autism Son   . Lung cancer Maternal Uncle     Review of Systems:    Review of Systems  Constitutional: Negative for chills and fever.  Cardiovascular: Positive for chest pain and leg swelling. Negative for palpitations.       Left leg swelling chronically  Gastrointestinal: Negative for heartburn, nausea and vomiting.  Musculoskeletal: Positive for joint pain.  Neurological: Negative for dizziness and headaches.  Endo/Heme/Allergies: Does not bruise/bleed easily.    Physical Exam:  Vitals:   03/26/17 1032  BP: 138/79  Pulse: 89  Temp: 98.4 F (36.9 C)  TempSrc: Oral  SpO2: 100%  Weight: 263 lb (119.3 kg)   Physical Exam  Constitutional: She is oriented to person, place, and time. She appears well-developed and well-nourished.  HENT:  Head: Normocephalic and atraumatic.  Mouth/Throat: No oropharyngeal exudate.  Eyes: Conjunctivae are  normal.  Cardiovascular: Exam reveals no gallop and no friction rub.   No murmur heard. Respiratory: Effort normal and breath sounds normal. No respiratory distress. She has no wheezes.  GI: Soft. Bowel sounds are normal.  Musculoskeletal: Normal range of motion.  Has surgical scar on her left leg and knee. Left leg is bigger than the right leg. Has non pitting edema on left leg. No erythema or tenderness.  Neurological: She is alert and oriented to person, place, and time.     Assessment & Plan:   See Encounters Tab for problem based charting.  Patient discussed with Dr. Evette Doffing

## 2017-03-26 NOTE — Assessment & Plan Note (Signed)
Had unprovoked PE. On eliquis currently. I recommended lifelong therapy unless something changes in the future. She is doing well other than residual chest pain which is improving.  - cont eliquis, switching to 5mg  bid tomorrow.

## 2017-03-26 NOTE — Assessment & Plan Note (Signed)
Mother had colon cancer at age 41 and diet from this at 11. Patient had her first cscope on age 5. Will need f/up colonoscopy, will put in referral.

## 2017-03-26 NOTE — Assessment & Plan Note (Addendum)
  Vitals:   03/26/17 1032  BP: 138/79  Pulse: 89  Temp: 98.4 F (36.9 C)   BP well controlled On HCTZ 25 mg daily. Continue current regimen.

## 2017-03-26 NOTE — Patient Instructions (Signed)
Keep taking your meds for now. Continue taking the blood thinner. Take tylenol for pain. I would stop taking ibuprofen.  We will call you with the lab results.

## 2017-03-26 NOTE — Assessment & Plan Note (Addendum)
Has hx of microcytic anemia, hgb ranging in 8-9. Is on iron tablets now but causing constipation. Also states having to get B12 shots in the past by her previous PCP - will recheck ferritin, B12 and MMA level today. If ferritin is low, will consider IV iron therapy.   Addendum: Ferritin is borderline low. With her chronic blood loss from the fibroids, she would benefit from iron replacement. Despite taking PO iron for last 6 months which is causing her significant constipation, her ferritin is still low. Therefore, I will do IV iron transfusion. We tried to call her to schedule her, VM is full. Will re-try and send a letter if not successful in reaching her.

## 2017-03-26 NOTE — Assessment & Plan Note (Signed)
Continues to have significant pain and menorrhagia from her fibroids. Saw Gyn, and plan was to do hysterectomy. However, with her recent acute PE, I recommended being on anticoag at least for 3 months before doing hysterectomy unless she has severe bleeding due to the fibroids.  - cont anticoag for now, after 3 months may consider hysterectomy and resumption of anticoag after that.  - tylenol for pain.

## 2017-03-27 ENCOUNTER — Other Ambulatory Visit: Payer: Self-pay | Admitting: Oncology

## 2017-03-27 ENCOUNTER — Telehealth: Payer: Self-pay | Admitting: *Deleted

## 2017-03-27 DIAGNOSIS — D500 Iron deficiency anemia secondary to blood loss (chronic): Secondary | ICD-10-CM

## 2017-03-27 NOTE — Telephone Encounter (Signed)
Called pt again- still no answer or mail box is full. I try her again on Monday.

## 2017-03-27 NOTE — Progress Notes (Signed)
Internal Medicine Clinic Attending  Case discussed with Dr. Ahmed at the time of the visit.  We reviewed the resident's history and exam and pertinent patient test results.  I agree with the assessment, diagnosis, and plan of care documented in the resident's note. 

## 2017-03-27 NOTE — Telephone Encounter (Addendum)
Called pt at home, no answer, no VM Called cell , no answer, mailbox full. Need to discuss best date/time to schedule iron infusion appt per Dr Genene Churn.

## 2017-03-27 NOTE — Telephone Encounter (Signed)
Thanks for calling her. Would you guys send a letter for her to call us back?

## 2017-03-28 LAB — FERRITIN: Ferritin: 23 ng/mL (ref 15–150)

## 2017-03-28 LAB — VITAMIN B12: Vitamin B-12: 456 pg/mL (ref 232–1245)

## 2017-03-28 LAB — METHYLMALONIC ACID, SERUM: METHYLMALONIC ACID: 101 nmol/L (ref 0–378)

## 2017-03-31 ENCOUNTER — Encounter: Payer: Self-pay | Admitting: *Deleted

## 2017-03-31 NOTE — Telephone Encounter (Signed)
Called pt, [phone] (H), no answer; able to leave  message this time to call us back. Also I will mail a letter today.

## 2017-04-01 ENCOUNTER — Telehealth: Payer: Self-pay | Admitting: *Deleted

## 2017-04-01 NOTE — Telephone Encounter (Signed)
Pt called - stated wrong telephone # was entered into her chart. Informed to call Short Stay to schedule appt for iron infusion [phone]. Also stated wanted to scheduled an appt here at the clinic- she found lump in her breast - appt scheduled for Friday in [facility].

## 2017-04-03 ENCOUNTER — Telehealth: Payer: Self-pay | Admitting: Internal Medicine

## 2017-04-03 NOTE — Telephone Encounter (Signed)
Calling to confirm appt for 04/04/17 at 1:45 lmtcb

## 2017-04-04 ENCOUNTER — Ambulatory Visit: Payer: Self-pay

## 2017-04-22 ENCOUNTER — Other Ambulatory Visit: Payer: Self-pay | Admitting: Internal Medicine

## 2017-04-22 MED ORDER — APIXABAN 5 MG PO TABS: 5.0000 mg | ORAL_TABLET | Freq: Two times a day (BID) | ORAL | 2 refills | Status: DC

## 2017-04-22 NOTE — Telephone Encounter (Signed)
apixaban (ELIQUIS) 5 MG TABS tablet [facility] [address]

## 2017-04-23 ENCOUNTER — Ambulatory Visit: Payer: Self-pay

## 2017-05-06 ENCOUNTER — Other Ambulatory Visit: Payer: Self-pay | Admitting: Internal Medicine

## 2017-05-06 ENCOUNTER — Emergency Department (HOSPITAL_BASED_OUTPATIENT_CLINIC_OR_DEPARTMENT_OTHER)
Admission: EM | Admit: 2017-05-06 | Discharge: 2017-05-06 | Disposition: A | Discharge status: Home / Self Care | Payer: Medicare Other | Attending: Emergency Medicine | Admitting: Emergency Medicine

## 2017-05-06 ENCOUNTER — Encounter (HOSPITAL_BASED_OUTPATIENT_CLINIC_OR_DEPARTMENT_OTHER): Payer: Self-pay | Admitting: *Deleted

## 2017-05-06 DIAGNOSIS — J45909 Unspecified asthma, uncomplicated: Secondary | ICD-10-CM | POA: Insufficient documentation

## 2017-05-06 DIAGNOSIS — N6311 Unspecified lump in the right breast, upper outer quadrant: Secondary | ICD-10-CM | POA: Insufficient documentation

## 2017-05-06 DIAGNOSIS — R222 Localized swelling, mass and lump, trunk: Secondary | ICD-10-CM | POA: Diagnosis present

## 2017-05-06 DIAGNOSIS — N6310 Unspecified lump in the right breast, unspecified quadrant: Secondary | ICD-10-CM

## 2017-05-06 DIAGNOSIS — I10 Essential (primary) hypertension: Secondary | ICD-10-CM | POA: Diagnosis not present

## 2017-05-06 DIAGNOSIS — F909 Attention-deficit hyperactivity disorder, unspecified type: Secondary | ICD-10-CM | POA: Insufficient documentation

## 2017-05-06 DIAGNOSIS — Z79899 Other long term (current) drug therapy: Secondary | ICD-10-CM | POA: Diagnosis not present

## 2017-05-06 MED ORDER — AMOXICILLIN-POT CLAVULANATE 875-125 MG PO TABS: 1.0000 | ORAL_TABLET | Freq: Two times a day (BID) | ORAL | 0 refills | Status: DC

## 2017-05-06 MED FILL — AMOX-CLAV 875-125 MG TABLET: 875-125 | 7 days supply | Qty: 14 | Fill #0

## 2017-05-06 NOTE — ED Triage Notes (Signed)
She had a marble sized lump on her right breast a month ago. It is now the size of a golf ball. Painless at first and now painful. No discharge from her nipple.

## 2017-05-06 NOTE — ED Provider Notes (Signed)
[facility] Provider Note   CSN: [id number] Arrival date & time: 05/06/17  1253     History   Chief Complaint No chief complaint on file.   HPI Kristen Swanson is a 41 y.o. female with a h/o of unprovoked PE on Eliquis who presents to the Emergency Department for a mass to the right breast. She reports she first noticed the mass approximately one month ago when it was the size over a marble. Over the last month the mass has increased to the size of a golf ball. No fever, chills. She reports the mass has become increasingly tender. She denies recent trauma or injury to the breast. No discharge. No family h/o of breast CA. No treatment PTA.   Patient reports she was admitted on 03/18/17 for unprovoked PE and discharged on Eliquis. Denies recent immobilization, travel, or dyspnea.   PMH includes asthma, menorrhagia, fibroid tumor of the uterus and pulmonary embolism. Medications include Eliquis.   The history is provided by the patient. No language interpreter was used.    Past Medical History:  Diagnosis Date  . ADHD (attention deficit hyperactivity disorder)   . Adjustment disorder with mixed anxiety and depressed mood   . Asthma   . Fibroid tumor   . Hypertension   . Kidney stones   . Microcytic anemia   . Pulmonary emboli [facility])     Patient Active Problem List   Diagnosis Date Noted  . Breast mass, right 05/07/2017  . Healthcare maintenance 03/26/2017  . HTN (hypertension) 03/26/2017  . Pulmonary emboli ([place]) 03/19/2017  . Microcytic anemia   . Fibroid uterus 01/01/2016  . Menorrhagia 01/01/2016  . ONYCHOMYCOSIS 02/26/2007  . Obesity 02/26/2007  . Asthma 02/26/2007    Past Surgical History:  Procedure Laterality Date  . LEG SURGERY    . REPLACEMENT TOTAL KNEE Left 2008    OB History    Gravida Para Term Preterm AB Living   5 3 3   2 3    SAB TAB Ectopic Multiple Live Births   1               Home Medications    Prior to Admission medications     Medication Sig Start Date End Date Taking? Authorizing Provider  acetaminophen (TYLENOL) 325 MG tablet Take 2 tablets (650 mg total) by mouth every 6 (six) hours as needed for moderate pain. 03/21/17  Yes Thurnell Lose, MD  apixaban (ELIQUIS) 5 MG TABS tablet Take 1 tablet (5 mg total) by mouth 2 (two) times daily. 04/22/17  Yes Ahmed, Chesley Mires, MD  ferrous sulfate 325 (65 FE) MG tablet Take 1 tablet (325 mg total) by mouth daily with breakfast. Patient taking differently: Take 325 mg by mouth at bedtime.  10/11/16  Yes Tanna Furry, MD  hydrochlorothiazide (HYDRODIURIL) 25 MG tablet Take 1 tablet (25 mg total) by mouth daily. 03/26/17  Yes Ahmed, Chesley Mires, MD  Multiple Vitamin (MULTIVITAMIN WITH MINERALS) TABS tablet Take 1 tablet by mouth daily.   Yes [provider]  valACYclovir (VALTREX) 500 MG tablet Take 500 mg by mouth daily. 12/27/15  Yes [provider]  zolpidem (AMBIEN) 10 MG tablet Take 10 mg by mouth at bedtime.  11/20/15  Yes [provider]  albuterol (PROVENTIL HFA;VENTOLIN HFA) 108 (90 Base) MCG/ACT inhaler Inhale 2 puffs into the lungs every 4 (four) hours as needed for wheezing or shortness of breath. 10/14/16   Delsa Grana, PA-C  amoxicillin-clavulanate (AUGMENTIN) 875-125 MG tablet  Take 1 tablet by mouth every 12 (twelve) hours. 05/06/17   McDonald, Mia A, PA-C  docusate sodium (COLACE) 100 MG capsule Take 2 capsules (200 mg total) by mouth 2 (two) times daily. 03/21/17   Thurnell Lose, MD    Family History Family History  Problem Relation Age of Onset  . Colon cancer Mother 48  . Stroke Father   . Hypertension Father   . Diabetes type I Daughter   . Autism Son   . Lung cancer Maternal Uncle     Social History Social History  Substance Use Topics  . Smoking status: Never Smoker  . Smokeless tobacco: Never Used  . Alcohol use No     Allergies   Patient has no known allergies.   Review of Systems Review of Systems  Constitutional:  Negative for activity change.  Respiratory: Negative for shortness of breath.   Cardiovascular: Negative for chest pain and leg swelling.  Gastrointestinal: Negative for abdominal pain, diarrhea, nausea and vomiting.  Musculoskeletal: Negative for back pain.  Skin: Negative for rash.       Breast mass  Allergic/Immunologic: Negative for immunocompromised state.  Neurological: Negative for headaches.  Hematological: Does not bruise/bleed easily.  Psychiatric/Behavioral: Negative for confusion.     Physical Exam Updated Vital Signs BP 129/81 (BP Location: Right Arm)   Pulse 94   Temp 98.3 F (36.8 C) (Oral)   Resp 14   Ht 5\' 9"  (1.753 m)   Wt 108 kg   LMP 04/29/2017   SpO2 100%   BMI 35.15 kg/m   Physical Exam  Constitutional: She appears well-developed and well-nourished. No distress.  HENT:  Head: Normocephalic and atraumatic.  Eyes: Conjunctivae are normal.  Neck: Neck supple.  Cardiovascular: Normal rate and regular rhythm.  Exam reveals no gallop and no friction rub.   No murmur heard. Pulmonary/Chest: Effort normal and breath sounds normal. No respiratory distress. She has no wheezes. She has no rales. Right breast exhibits mass. Right breast exhibits no inverted nipple, no nipple discharge, no skin change and no tenderness. Left breast exhibits no inverted nipple, no mass, no nipple discharge, no skin change and no tenderness. Breasts are symmetrical. There is no breast swelling.  There is a fluctuant golf-ball size mass to the upper, outer quadrant between 8 and 10 o'clock of the right breast with tenderness to palpation. No axillary lymphadenopathy. No overlying erythema, warmth, swelling, or ecchymosis. No nipple discharge.   Abdominal: Soft. She exhibits no distension. There is no tenderness. There is no guarding.  Genitourinary: There is breast tenderness and discharge. No breast bleeding.  Musculoskeletal: She exhibits no edema.  Neurological: She is alert.  Skin:  Skin is warm and dry. No rash noted. She is not diaphoretic.  Psychiatric: Her behavior is normal.  Nursing note and vitals reviewed.    ED Treatments / Results  Labs (all labs ordered are listed, but only abnormal results are displayed) Labs Reviewed - No data to display  EKG  EKG Interpretation None       Radiology No results found.  Procedures Procedures (including critical care time)  Medications Ordered in ED Medications - No data to display   Initial Impression / Assessment and Plan / ED Course  I have reviewed the triage vital signs and the nursing notes.  Pertinent labs & imaging results that were available during my care of the patient were reviewed by me and considered in my medical decision making (see chart for details).  Patient history and physical consistent with large mass to the right breast. No erythema or induration notedt.  No bloody discharge from the nipple. No axillary lymphadenopathy noted on the affected side.  Will give Augmentin. The patient was admitted to the hospital in March for a PE and has a follow up appointment with pulmonology tomorrow.  I encouraged her to keep this appointment. Will discharge to home with a referral to the [facility] for a screening US. She is alert, oriented, nontoxic and nonseptic appearing. She is afebrile and non-tachycardic. She acknowledged understanding of the plan and is agreeable at this time.  Final Clinical Impressions(s) / ED Diagnoses   Final diagnoses:  Large mass of right breast    New Prescriptions Discharge Medication List as of 05/06/2017  2:31 PM    START taking these medications   Details  amoxicillin-clavulanate (AUGMENTIN) 875-125 MG tablet Take 1 tablet by mouth every 12 (twelve) hours., Starting Tue 05/06/2017, Print         McDonald, Mia A, PA-C 05/08/17 2213    Drenda Freeze, MD 05/10/17 [phone]

## 2017-05-06 NOTE — Discharge Instructions (Signed)
Please call the Breast Center to make an appointment for a screening ultrasound. Please keep your appointment tomorrow with your primary care provider. You have been prescribed Augmentin. Please take it twice a day for the next 7 days. I have included the name of your OBGYN for your convenience. If you develop worsening symptoms including fever or chills, you can return to the Emergency Department for re-evaluation.

## 2017-05-07 ENCOUNTER — Encounter: Payer: Self-pay | Admitting: Internal Medicine

## 2017-05-07 ENCOUNTER — Ambulatory Visit (INDEPENDENT_AMBULATORY_CARE_PROVIDER_SITE_OTHER): Payer: Medicare Other | Admitting: Internal Medicine

## 2017-05-07 VITALS — BP 127/80 | HR 97 | Temp 98.0°F | Ht 70.0 in | Wt 256.9 lb

## 2017-05-07 DIAGNOSIS — N6311 Unspecified lump in the right breast, upper outer quadrant: Secondary | ICD-10-CM | POA: Diagnosis present

## 2017-05-07 DIAGNOSIS — R599 Enlarged lymph nodes, unspecified: Secondary | ICD-10-CM | POA: Insufficient documentation

## 2017-05-07 DIAGNOSIS — I2699 Other pulmonary embolism without acute cor pulmonale: Secondary | ICD-10-CM | POA: Diagnosis not present

## 2017-05-07 DIAGNOSIS — N6310 Unspecified lump in the right breast, unspecified quadrant: Secondary | ICD-10-CM

## 2017-05-07 DIAGNOSIS — Z7901 Long term (current) use of anticoagulants: Secondary | ICD-10-CM

## 2017-05-07 NOTE — Assessment & Plan Note (Addendum)
Hx of PE less than 3 months ago. Still on anticoagulation. Doing well. No chest pain and SOB is improving. Per Dr. Brandt Loosen note this is an unprovoked PE and would require life long anticoagulation.  -- Continue Eliquis

## 2017-05-07 NOTE — Assessment & Plan Note (Addendum)
Patient with 1 month history of enlarging right-sided breast mass. She first noticed this approximately one month ago and this was the size of a marble. It is located in the upper outer quadrant of the right breast. This has increased in size over the month and has become painful and tender to light touch and exquisitely tender to palpation. It is now approximately the size of a golf ball. She was seen in the ED on 05/06/2017 and was told she may have an infection and was given Augmentin and told to complete a 7 day course and to follow up with her PCP. Today there is no overlying erythema or cellulitis. I advised the patient to continue to complete her course of Augmentin for 7 days. I will make a stat referral for her to have bilateral mammogram and right-sided breast ultrasound. The differential diagnosis for this breast mass includes infection, abscess or rapid growing malignancy. Additionally, given the patient's recent history of an unprovoked PE I am concerned that this may represent an underlying malignancy and made patient hypercoagulable resulting in a pulmonary embolism. As of now she has no constitutional symptoms. She denies fevers, night sweats or weight loss. For now, I have advised the patient to continue antibiotics and to follow-up with the breast center. Referrals have been made. -- Complete Augmentin as prescribed in the emergency department -- Bilateral mammogram -- Right-sided breast ultrasound  ADDENDUM Breast ultrasound demonstrated bilateral concerning masses. BI-RADS Category 4. Will require coronary aspiration versus cyst biopsy. There is concern over anticoagulation prior to this procedure. She will need to discontinue eliquis 48 hours before the procedure and may resume 24 hours after the procedure.

## 2017-05-07 NOTE — Patient Instructions (Signed)
It was a pleasure seeing you today. Thank you for choosing [facility] for your healthcare needs.   Please finish the antibiotic.  Please follow-up with the breast center for an ultrasound and mammogram.  If The mass continues to grow in size or worsening pain before you see the breast clinic please call the clinic here.

## 2017-05-07 NOTE — Progress Notes (Signed)
   CC: Enlarging and painful Right breast lump HPI: Ms. Kristen Swanson is a 41 y.o. female with a h/o of ADHD, asthma, fibroid tumor, hypertension, pulmonary embolism on anticoagulation and kidney stones who presents with a right breast mass.    Review of Systems: Denies n/v and abdominal pain. Denies fevers and chills. Denies weight loss. Denies shortness of breath.   Physical Exam: There were no vitals filed for this visit. Physical Exam  Constitutional: She is oriented to person, place, and time. She appears well-developed and well-nourished.  Cardiovascular: Normal rate.   Respiratory: Effort normal and breath sounds normal.  GI: Soft. Bowel sounds are normal.  Genitourinary: There is breast tenderness.  Genitourinary Comments: Patient has a very large golf ball size lump in the upper outer quadrant of her right breast between 9 and 10 o'clock. This mass appears and feels somewhat fluctuant but it is difficult to tell on examination given the patient's exquisite tenderness to palpation. No overlying cellulitis or erythema.  Musculoskeletal: She exhibits edema.  Neurological: She is alert and oriented to person, place, and time.     Assessment & Plan:  See encounters tab for problem based medical decision making. Patient discussed with Dr. Angelia Mould  Signed: Ophelia Shoulder, MD 05/07/2017, 2:07 PM  Pager: [phone]

## 2017-05-09 ENCOUNTER — Other Ambulatory Visit: Payer: Self-pay | Admitting: Internal Medicine

## 2017-05-09 ENCOUNTER — Ambulatory Visit
Admission: RE | Admit: 2017-05-09 | Discharge: 2017-05-09 | Disposition: A | Discharge status: Home / Self Care | Payer: Medicare Other | Source: Ambulatory Visit | Attending: Internal Medicine | Admitting: Internal Medicine

## 2017-05-09 DIAGNOSIS — R922 Inconclusive mammogram: Secondary | ICD-10-CM | POA: Diagnosis not present

## 2017-05-09 DIAGNOSIS — R928 Other abnormal and inconclusive findings on diagnostic imaging of breast: Secondary | ICD-10-CM

## 2017-05-09 DIAGNOSIS — N6310 Unspecified lump in the right breast, unspecified quadrant: Secondary | ICD-10-CM

## 2017-05-09 DIAGNOSIS — N6320 Unspecified lump in the left breast, unspecified quadrant: Secondary | ICD-10-CM

## 2017-05-09 DIAGNOSIS — N6489 Other specified disorders of breast: Secondary | ICD-10-CM | POA: Diagnosis not present

## 2017-05-09 DIAGNOSIS — R599 Enlarged lymph nodes, unspecified: Secondary | ICD-10-CM

## 2017-05-12 NOTE — Progress Notes (Signed)
Ideally, if we can get her out 1 month minimally from her initial index event there can be interruption of anticoagulation without the usual sequelae of its discontinuation < 1 month. This comes from NOT a randomized prospective trial--but a review of the literature in similar circumstances by a Physician--Dr. Kary Kos ([facility], [place], [place]) some years ago which from an EBM perspective would be considered "expert opinion" but it became the opinion which drove clinical practice. No matter WHICH she is <9m or >19m it sounds as if she needs this done sooner than later. I would recommend stopping warfarin 4-5 days in advance of any planned biopsy or procedure; 36h after d/c warfarin, commence 1mg /kg SQ q12 or 1.5mg /kg SQ q24h of enoxaparin and take this up until 24h of procedure. S/P procedure, we would recommence both enoxaparin + warfarin for a minimum of 5 days overlap and check her INR carefully and often here in the clinic.

## 2017-05-13 NOTE — Progress Notes (Signed)
Internal Medicine Clinic Attending  Case discussed with Dr. Taylor at the time of the visit.  We reviewed the resident's history and exam and pertinent patient test results.  I agree with the assessment, diagnosis, and plan of care documented in the resident's note. 

## 2017-05-14 ENCOUNTER — Telehealth: Payer: Self-pay | Admitting: Internal Medicine

## 2017-05-14 ENCOUNTER — Telehealth: Payer: Self-pay | Admitting: *Deleted

## 2017-05-14 NOTE — Telephone Encounter (Signed)
Call placed to pt for follow up per Dr Rip Harbour request.  Asked pt about f/u appt for breast Bx. Pt states that she has appt scheduled for Bx on 05/28/17.  Pt was strongly advised to keep this appt.  Follow up with pt regarding PE and if she has seen Primary Provider regarding PE and medication.  Pt states that she has been seen at Internal Medicine and is currently still on medication.  Pt was made aware while reviewing chart she has received Mychart message regarding need for follow up appt with Internal Medicine.  Pt was advised to review her MyChart for message and contact her Int Med office in order to set up appt. Pt states understanding and states she will contact office to set up appt.  Dr Rip Harbour was made aware pt appt for Bx and she has had follow up regarding PE.

## 2017-05-14 NOTE — Telephone Encounter (Signed)
I have attempted to contact the patient by phone several times to discuss the US findings and next step of care. She has not answered. We have also sent her an e-mail through [facility].

## 2017-05-15 ENCOUNTER — Ambulatory Visit (INDEPENDENT_AMBULATORY_CARE_PROVIDER_SITE_OTHER): Payer: Medicare Other | Admitting: Internal Medicine

## 2017-05-15 ENCOUNTER — Encounter: Payer: Self-pay | Admitting: Internal Medicine

## 2017-05-15 VITALS — BP 138/74 | HR 102 | Temp 98.4°F | Ht 69.0 in | Wt 256.6 lb

## 2017-05-15 DIAGNOSIS — D259 Leiomyoma of uterus, unspecified: Secondary | ICD-10-CM

## 2017-05-15 DIAGNOSIS — N6310 Unspecified lump in the right breast, unspecified quadrant: Secondary | ICD-10-CM | POA: Diagnosis present

## 2017-05-15 NOTE — Progress Notes (Signed)
Case discussed with Dr. Ahmed at the time of the visit. We reviewed the resident's history and exam and pertinent patient test results. I agree with the assessment, diagnosis, and plan of care documented in the resident's note. 

## 2017-05-15 NOTE — Progress Notes (Signed)
   CC: breast mass f/up   HPI:  Kristen Swanson is a 41 y.o. with PMH as listed below is here for breast mass f/up  Past Medical History:  Diagnosis Date  . ADHD (attention deficit hyperactivity disorder)   . Adjustment disorder with mixed anxiety and depressed mood   . Asthma   . Fibroid tumor   . Hypertension   . Kidney stones   . Microcytic anemia   . Pulmonary emboli ([place])     Went to ED 5/8 with large mass on ler right breast, was started on augmentin x7 days,saw Dr. Lovena Le on 5/9 for this, stat mammogram was ordered along with stat right sided breast u/s.   Breast mammogram showed R breast 930 oclock cluster of masses likely hemorrhagic cyst, and probably benign left breast 2 oclok and 7 oclok cysts. Also showed b/l axillary lymphadenopathy L>R.  Recommended u/s guided cyst aspiration of the right breast mass and u/s guided core needle bx of the left axillary lymph node. Patient is scheduled for these procedures on 5/30. She came in to discuss the procedure today.   She is doing well, pain is a bit better, has 1 more day of augmentin left, no fevers, no discharge.  I answer all of her questions. Was instructed to hold eliquis 48 hours before the procedure and to resume 24 hours after the procedure.   Review of Systems:   Review of Systems  Constitutional: Negative for chills and fever.  Cardiovascular: Negative for chest pain and palpitations.  Gastrointestinal: Negative for heartburn, nausea and vomiting.  Neurological: Negative for dizziness and headaches.     Physical Exam:  Vitals:   05/15/17 1343  BP: 138/74  Pulse: (!) 102  Temp: 98.4 F (36.9 C)  TempSrc: Oral  SpO2: 100%  Weight: 256 lb 9.6 oz (116.4 kg)  Height: 5\' 9"  (1.753 m)   Physical Exam  Constitutional: She is oriented to person, place, and time. She appears well-developed and well-nourished.  Cardiovascular: Exam reveals no gallop and no friction rub.   No murmur heard. Respiratory: Effort  normal and breath sounds normal.  Genitourinary:  Genitourinary Comments: Did not perform breast exam today  Musculoskeletal: Normal range of motion. She exhibits no edema.  Neurological: She is alert and oriented to person, place, and time.    Assessment & Plan:   See Encounters Tab for problem based charting.  Patient discussed with Dr. Eppie Gibson

## 2017-05-15 NOTE — Assessment & Plan Note (Signed)
reast mammogram showed R breast 930 oclock cluster of masses likely hemorrhagic cyst, and probably benign left breast 2 oclok and 7 oclok cysts. Also showed b/l axillary lymphadenopathy L>R.  Recommended u/s guided cyst aspiration of the right breast mass and u/s guided core needle bx of the left axillary lymph node. Patient is scheduled for these procedures on 5/30.  Was instructed to hold eliquis 48 hours before the procedure and to resume 24 hours after the procedure.

## 2017-05-15 NOTE — Assessment & Plan Note (Addendum)
Continues to have heavy periods and pain with menstruation. Saw gyn in the past, plan was to do a hysterectomy but due to her PE that was deferred as she is on anticoag.   Asked her to see her gyn again as she is symptomatic. We can try to bridge her with lovenox if needed if they want to do the procedure. Otherwise, I would recommend continuing the anticoag for 6 months total (from March 2018 for her unprovoked PE) and then stop it given the fact she is having heavy bleeding.  Also scheduled for IV iron to help with microcytic anemia from the bleeding.

## 2017-05-15 NOTE — Patient Instructions (Signed)
Keep your appt for the biopsy procedure on 5/30. We will know better after we get the biopsy results.  Continue our blood thinner, stop 48 hours before the procedure and restart after 24 hours.  Call your gyn about your uterine fibroids.

## 2017-05-28 ENCOUNTER — Ambulatory Visit
Admission: RE | Admit: 2017-05-28 | Discharge: 2017-05-28 | Disposition: A | Discharge status: Home / Self Care | Payer: Medicare Other | Source: Ambulatory Visit | Attending: Internal Medicine | Admitting: Internal Medicine

## 2017-05-28 ENCOUNTER — Other Ambulatory Visit: Payer: Self-pay | Admitting: Internal Medicine

## 2017-05-28 DIAGNOSIS — R928 Other abnormal and inconclusive findings on diagnostic imaging of breast: Secondary | ICD-10-CM

## 2017-05-28 DIAGNOSIS — N6310 Unspecified lump in the right breast, unspecified quadrant: Secondary | ICD-10-CM

## 2017-05-28 DIAGNOSIS — R599 Enlarged lymph nodes, unspecified: Secondary | ICD-10-CM

## 2017-05-28 DIAGNOSIS — R590 Localized enlarged lymph nodes: Secondary | ICD-10-CM | POA: Diagnosis not present

## 2017-05-28 DIAGNOSIS — N6001 Solitary cyst of right breast: Secondary | ICD-10-CM | POA: Diagnosis not present

## 2017-05-29 ENCOUNTER — Telehealth: Payer: Self-pay | Admitting: Internal Medicine

## 2017-05-29 ENCOUNTER — Other Ambulatory Visit (HOSPITAL_COMMUNITY)
Admission: RE | Admit: 2017-05-29 | Discharge: 2017-05-29 | Disposition: A | Discharge status: Home / Self Care | Payer: Medicare Other | Source: Ambulatory Visit | Attending: Diagnostic Radiology | Admitting: Diagnostic Radiology

## 2017-05-29 NOTE — Telephone Encounter (Signed)
I called the patient this morning to see how she was doing after her breast biopsy yesterday. She said she was having significant soreness. I instructed her to take Tylenol and ibuprofen as needed. Also, her anticoagulation was held 48 hours before the procedure. I instructed her to start this medication this evening. She endorsed understanding of this. I informed her that the breast aspiration appeared to be consistent with benign cystic fluid. She had extensive left axillary lymphadenopathy which required biopsy sent for pathology. We will follow-up with this.

## 2017-06-05 ENCOUNTER — Telehealth: Payer: Self-pay | Admitting: Internal Medicine

## 2017-06-05 NOTE — Telephone Encounter (Signed)
I spoke with the patient on the phone this AM regarding the results of her lymph node Bx. The pathology report showed lymphoid hyperplasia. The node was also noted to be enlarged. She has not had any pain in the area. She will need to be seen in clinic so we can examine the node and get additional history from her to know how to guide further management. I tried to answer all her questions on the phone. She was amenable to returning to clinic next week.

## 2017-06-12 ENCOUNTER — Encounter: Payer: Self-pay | Admitting: Internal Medicine

## 2017-06-12 ENCOUNTER — Ambulatory Visit (INDEPENDENT_AMBULATORY_CARE_PROVIDER_SITE_OTHER): Payer: Medicare Other | Admitting: Internal Medicine

## 2017-06-12 ENCOUNTER — Other Ambulatory Visit: Payer: Self-pay | Admitting: *Deleted

## 2017-06-12 VITALS — BP 113/78 | Temp 99.0°F | Wt 259.6 lb

## 2017-06-12 DIAGNOSIS — M79662 Pain in left lower leg: Secondary | ICD-10-CM | POA: Diagnosis not present

## 2017-06-12 DIAGNOSIS — M898X6 Other specified disorders of bone, lower leg: Secondary | ICD-10-CM

## 2017-06-12 DIAGNOSIS — R590 Localized enlarged lymph nodes: Secondary | ICD-10-CM | POA: Diagnosis present

## 2017-06-12 DIAGNOSIS — G8929 Other chronic pain: Secondary | ICD-10-CM

## 2017-06-12 DIAGNOSIS — N920 Excessive and frequent menstruation with regular cycle: Secondary | ICD-10-CM | POA: Diagnosis not present

## 2017-06-12 DIAGNOSIS — Z8781 Personal history of (healed) traumatic fracture: Secondary | ICD-10-CM | POA: Diagnosis not present

## 2017-06-12 DIAGNOSIS — Z7901 Long term (current) use of anticoagulants: Secondary | ICD-10-CM | POA: Diagnosis not present

## 2017-06-12 DIAGNOSIS — E669 Obesity, unspecified: Secondary | ICD-10-CM | POA: Diagnosis not present

## 2017-06-12 DIAGNOSIS — R599 Enlarged lymph nodes, unspecified: Secondary | ICD-10-CM

## 2017-06-12 DIAGNOSIS — Z96698 Presence of other orthopedic joint implants: Secondary | ICD-10-CM | POA: Diagnosis not present

## 2017-06-12 DIAGNOSIS — M25562 Pain in left knee: Secondary | ICD-10-CM

## 2017-06-12 DIAGNOSIS — I2699 Other pulmonary embolism without acute cor pulmonale: Secondary | ICD-10-CM

## 2017-06-12 DIAGNOSIS — D259 Leiomyoma of uterus, unspecified: Secondary | ICD-10-CM

## 2017-06-12 MED ORDER — FERROUS GLUCONATE 324 (38 FE) MG PO TABS: 324.0000 mg | ORAL_TABLET | Freq: Every day | ORAL | 3 refills | Status: DC

## 2017-06-12 MED ORDER — POLYETHYLENE GLYCOL 3350 17 G PO PACK: 17.0000 g | PACK | Freq: Every day | ORAL | 0 refills | Status: DC | PRN

## 2017-06-12 NOTE — Telephone Encounter (Signed)
Pt was seen in [facility] today. After visit requesting refills - request sent to PCP. I will send message to front office to schedule appt w/PCP.

## 2017-06-12 NOTE — Assessment & Plan Note (Addendum)
Assessment Patient continues to have heavy menstrual bleeding and pain with menstruation secondary to her uterine fibroids. Reports having monthly menstrual cycles that last 7 days. She was previously advised to see gynecology and has not followed up with them yet. Her hemoglobin was 8.7, MCV 77.2, and ferritin 23 in March 2018. Patient was previously advised to take an iron supplement but she stopped taking it secondary to constipation. 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 As such, it is best to delay elective procedures until the end of her treatment. Advised her to follow-up with gynecology soon for possible oral contraceptive pill for the next 3 months to temporarily control her menorrhagia since she is currently on full dose anticoagulation.   Plan -Referral to gynecology -Start Ferrous gluconate daily as it is better tolerated than ferrous sulfate -MiraLAX daily as needed for constipation plus over-the-counter Colace -Check CBC -Return to the clinic in 2 months for repeat ferritin to assess response to oral iron supplementation  Addendum 06/13/2017 at 9:28 AM: Hemoglobin stable at 8.5. Platelets increased at 439,000, likely reactive thrombocytosis due to her iron deficiency anemia. I tried calling the patient on her home and mobile numbers and could not reach her. Will try calling again.  Addendum 06/13/2017 at 5:29 PM: Spoke to the patient over the phone and discussed labs. Advised her to start taking the iron supplement and follow-up with gynecology as soon.

## 2017-06-12 NOTE — Telephone Encounter (Signed)
I believe Dr. Marlowe Sax saw this patient and will copy her instead of myself.

## 2017-06-12 NOTE — Progress Notes (Signed)
   CC: Patient is here to discuss her recent axillary lymph node biopsy results. She is also complaining of pain in her left tibia and knee. History of PE and uterine fibroids were also discussed during this visit.  HPI:  Kristen Swanson is a 41 y.o. female with a past medical history of conditions listed below presenting to the clinic to discuss her recent axillary lymph node biopsy results. She is also complaining of pain in her left tibia and knee. History of PE and uterine fibroids were also discussed during this visit. Please see problem based charting for the status of the patient's current and chronic medical conditions.     Past Medical History:  Diagnosis Date  . ADHD (attention deficit hyperactivity disorder)   . Adjustment disorder with mixed anxiety and depressed mood   . Asthma   . Fibroid tumor   . Hypertension   . Kidney stones   . Microcytic anemia   . Pulmonary emboli ([facility])     Review of Systems: Pertinent positives mentioned in HPI. Remainder of all ROS negative.   Physical Exam:  Vitals:   06/12/17 1508  BP: 113/78  Temp: 99 F (37.2 C)  TempSrc: Oral  SpO2: 100%  Weight: 259 lb 9.6 oz (117.8 kg)   Physical Exam  Constitutional: She is oriented to person, place, and time. She appears well-developed and well-nourished. No distress.  HENT:  Head: Normocephalic and atraumatic.  Eyes: Right eye exhibits no discharge. Left eye exhibits no discharge.  Cardiovascular: Normal rate, regular rhythm and intact distal pulses.   Pulmonary/Chest: Effort normal and breath sounds normal. No respiratory distress. She has no wheezes. She has no rales.  Abdominal: Soft. Bowel sounds are normal. She exhibits no distension. There is no tenderness.  Musculoskeletal: She exhibits no edema.  Examination was limited secondary to patient's large body habitus. No supraclavicular or axillary lymphadenopathy appreciated bilaterally. Questionable mass palpated at 9' clock position in  the right breast. No masses palpated on exam of the left breast.  Left tibia nontender to palpation. Calves appear symmetrical in size bilaterally and there is no erythema, increased warmth, or swelling. Calves nontender to palpation bilaterally.   Knees bilaterally: She has normal range of motion of her knees. No erythema, increased warmth, or swelling. No joint effusion noted. Noted to have crepitus bilaterally.  Neurological: She is alert and oriented to person, place, and time.  Skin: Skin is warm and dry.    Assessment & Plan:   See Encounters Tab for problem based charting.  Patient discussed with Dr. Beryle Beams

## 2017-06-12 NOTE — Assessment & Plan Note (Addendum)
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 She is currently on Eliquis 5 mg twice daily and plan is to continue anticoagulation for a total of 6 months. Hypercoagulable workup done during her hospitalization was negative - including prothrombin gene mutation, protein S activity, protein C activity, lupus anticoagulant, homocystine level, factor V Leiden mutation, cardiolipin antibody, beta-2 glycoprotein antibodies, and antithrombin III activity. Patient denies ever being on any oral contraceptive pill. Denies any travel or prolonged immobilization previously. Denies any family history of blood clots.  Plan -Continue Eliquis for 6 months -Check factor VIII activity as increased level of factor VIII has been implicated in causing increased risk of thrombosis in African-Americans.  Addendum 06/13/2017 at 9:25 AM: Factor VIII activity increased at 246% (reference range is 57-163%).  -Check ESR -If ESR is normal, increased factor VIII activity could explain her unprovoked PE I tried calling the patient on her home and mobile numbers and could not reach her. Will try again later.  Addendum 06/13/2017 at 5:28 PM: ESR mildly elevated at 49 (reference range 0-32). Patient does not have a history of vasculitis, arthritis, or any rheumatologic disease. She does have reactive lymphoid hyperplasia which could possibly explain the mild elevation in her ESR. -Recheck factor VIII activity and ESR in 4-6 months -Spoke to the patient over the phone and she agrees with the plan

## 2017-06-12 NOTE — Assessment & Plan Note (Addendum)
History of present illness Patient reports having chronic pain in her left tibia and left knee. States she broke her tibia in 2007 and had 5 surgeries and hardware placement at that time. Reports having occasional swelling in her left lower extremity. States her pain is worse after standing for long period of time. Denies having any calf pain or swelling. Denies any recent falls or injury to her tibia or knee.  Assessment Unclear etiology of left tibial pain, could possibly be due to displacement of hardware. Tibia nontender to palpation on exam. DVT less likely as calves appears symmetrical in size; there is no erythema, increased warmth, swelling or tenderness to palpation bilaterally. In addition, patient is currently on full dose anticoagulation with Eliquis for unprovoked PE diagnosed in March 2018. Knee pain could likely be secondary to osteoarthritis as patient is obese and was found to have crepitus on exam bilaterally. Knee exam otherwise benign.  Plan -X-ray of left knee to rule out osteoarthritis -X-ray of left tibia/fibula to rule out displacement of surgical hardware

## 2017-06-12 NOTE — Assessment & Plan Note (Addendum)
History of present illness Patient went to the ED on 05/06/2017 for a large right breast mass and was treated with a course of Augmentin. She then returned to [facility] on 05/07/2017 and a stat mammogram and right breast ultrasound was ordered at this visit. Breast mammogram done on 05/09/2017 showing right breast 9:30 o'clock cluster of palpable tender hypoechoic masses, likely hemorrhagic cysts and possibly benign left breast 2:00 o'clock and 7:00 o'clock cysts. Also showed bilateral axillary lymphadenopathy, left greater than right. Patient then underwent an ultrasound-guided cyst aspiration of the right breast on 05/28/2017. Per report, the aspirated fluid was thought to be benign in nature and a screening mammogram was recommended in 1 year. Patient also underwent an ultrasound-guided core needle biopsy of the left axillary lymph node; pathology result showing reactive lymphoid hyperplasia. Tissue flow cytometry showing no monoclonal B-cell population or abnormal T-cell phenotype. At present, patient reports feeling well and denies having any pain in her breasts. Denies having any fevers or weight loss. Denies having any pulmonary symptoms such as shortness of breath or cough.  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 HIV antibody checked in March 2018 was nonreactive. Although less likely, fibrocystic breast disease could possibly be a source for the reactive lymphoid hyperplasia. Castleman's disease is also on the differential. Questionable mass palpated on examination of the right breast at 9:00 position, which is the same location where patient was found to have hemorrhagic cysts. Examination was limited secondary to patient's large body habitus.  Plan -She will need repeat screening mammogram in 1 year -Advised patient to contact the clinic if she experiences any fevers, chills, weight loss or palpates  any new breast masses or enlarged lymph nodes.

## 2017-06-12 NOTE — Patient Instructions (Addendum)
Ms. Ostrom it was nice meeting you today.  -You will need a repeat mammogram in one year. Please contact the clinic immediately if you notice any swelling under your arms or any new lumps in your breasts.   -I have ordered x-rays of your left knee and left lower leg.  -Please take ferrous gluconate daily as instructed  -Referral to gynecology has been placed. Please schedule an appointment as soon as possible.

## 2017-06-13 LAB — CBC WITH DIFFERENTIAL/PLATELET
Basophils Absolute: 0 10*3/uL (ref 0.0–0.2)
Basos: 1 %
EOS (ABSOLUTE): 0.2 10*3/uL (ref 0.0–0.4)
EOS: 5 %
HEMOGLOBIN: 8.5 g/dL — AB (ref 11.1–15.9)
Hematocrit: 28.2 % — ABNORMAL LOW (ref 34.0–46.6)
Immature Grans (Abs): 0 10*3/uL (ref 0.0–0.1)
Immature Granulocytes: 0 %
LYMPHS ABS: 1.9 10*3/uL (ref 0.7–3.1)
Lymphs: 41 %
MCH: 21.5 pg — AB (ref 26.6–33.0)
MCHC: 30.1 g/dL — AB (ref 31.5–35.7)
MCV: 71 fL — ABNORMAL LOW (ref 79–97)
MONOCYTES: 10 %
Monocytes Absolute: 0.5 10*3/uL (ref 0.1–0.9)
NEUTROS ABS: 2 10*3/uL (ref 1.4–7.0)
Neutrophils: 43 %
Platelets: 439 10*3/uL — ABNORMAL HIGH (ref 150–379)
RBC: 3.96 x10E6/uL (ref 3.77–5.28)
RDW: 17.6 % — ABNORMAL HIGH (ref 12.3–15.4)
WBC: 4.7 10*3/uL (ref 3.4–10.8)

## 2017-06-13 LAB — SPECIMEN STATUS REPORT

## 2017-06-13 LAB — SEDIMENTATION RATE: Sed Rate: 49 mm/hr — ABNORMAL HIGH (ref 0–32)

## 2017-06-13 LAB — FACTOR 8 ASSAY: FACTOR VIII ACTIVITY: 246 % — AB (ref 57–163)

## 2017-06-13 NOTE — Progress Notes (Signed)
Medicine attending: Medical history, presenting problems, physical findings, and medications, reviewed with resident physician Dr Vasu Rathore on the day of the patient visit and I concur with her evaluation and management plan. 

## 2017-06-13 NOTE — Telephone Encounter (Signed)
I cannot find why she is on valtrex with chart review and would prefer to discuss other options that are safer than ambien if she has insomnia. I will hold off on approving these refill request until I have met with Kristen Swanson.

## 2017-06-13 NOTE — Addendum Note (Signed)
Addended by: Shela Leff on: 06/13/2017 09:24 AM   Modules accepted: Orders

## 2017-06-17 ENCOUNTER — Encounter: Payer: Self-pay | Admitting: Obstetrics & Gynecology

## 2017-07-01 ENCOUNTER — Encounter: Payer: Self-pay | Admitting: *Deleted

## 2017-07-18 ENCOUNTER — Encounter: Payer: Self-pay | Admitting: General Practice

## 2017-07-18 ENCOUNTER — Encounter: Payer: Self-pay | Admitting: Obstetrics & Gynecology

## 2017-07-22 NOTE — Addendum Note (Signed)
Addended by: Yvonna Alanis E on: 07/22/2017 06:30 PM   Modules accepted: Orders

## 2017-07-30 ENCOUNTER — Other Ambulatory Visit: Payer: Self-pay | Admitting: *Deleted

## 2017-07-30 MED ORDER — APIXABAN 5 MG PO TABS: 5.0000 mg | ORAL_TABLET | Freq: Two times a day (BID) | ORAL | 2 refills | Status: DC

## 2017-08-12 ENCOUNTER — Encounter: Payer: Self-pay | Admitting: Internal Medicine

## 2017-08-19 ENCOUNTER — Ambulatory Visit (INDEPENDENT_AMBULATORY_CARE_PROVIDER_SITE_OTHER): Payer: Medicare Other | Admitting: Internal Medicine

## 2017-08-19 ENCOUNTER — Encounter: Payer: Self-pay | Admitting: Internal Medicine

## 2017-08-19 VITALS — BP 142/86 | HR 91 | Temp 98.5°F | Ht 69.0 in | Wt 254.6 lb

## 2017-08-19 DIAGNOSIS — N920 Excessive and frequent menstruation with regular cycle: Secondary | ICD-10-CM

## 2017-08-19 DIAGNOSIS — Z0000 Encounter for general adult medical examination without abnormal findings: Secondary | ICD-10-CM

## 2017-08-19 DIAGNOSIS — Z9689 Presence of other specified functional implants: Secondary | ICD-10-CM

## 2017-08-19 DIAGNOSIS — R590 Localized enlarged lymph nodes: Secondary | ICD-10-CM | POA: Diagnosis not present

## 2017-08-19 DIAGNOSIS — S82202S Unspecified fracture of shaft of left tibia, sequela: Secondary | ICD-10-CM | POA: Diagnosis not present

## 2017-08-19 DIAGNOSIS — I2699 Other pulmonary embolism without acute cor pulmonale: Secondary | ICD-10-CM

## 2017-08-19 DIAGNOSIS — D509 Iron deficiency anemia, unspecified: Secondary | ICD-10-CM

## 2017-08-19 DIAGNOSIS — D500 Iron deficiency anemia secondary to blood loss (chronic): Secondary | ICD-10-CM

## 2017-08-19 DIAGNOSIS — Z800 Family history of malignant neoplasm of digestive organs: Secondary | ICD-10-CM

## 2017-08-19 DIAGNOSIS — Z9114 Patient's other noncompliance with medication regimen: Secondary | ICD-10-CM

## 2017-08-19 DIAGNOSIS — X58XXXS Exposure to other specified factors, sequela: Secondary | ICD-10-CM

## 2017-08-19 DIAGNOSIS — M79662 Pain in left lower leg: Secondary | ICD-10-CM | POA: Diagnosis not present

## 2017-08-19 DIAGNOSIS — I2692 Saddle embolus of pulmonary artery without acute cor pulmonale: Secondary | ICD-10-CM

## 2017-08-19 DIAGNOSIS — Z7901 Long term (current) use of anticoagulants: Secondary | ICD-10-CM | POA: Diagnosis not present

## 2017-08-19 DIAGNOSIS — D259 Leiomyoma of uterus, unspecified: Secondary | ICD-10-CM

## 2017-08-19 DIAGNOSIS — R599 Enlarged lymph nodes, unspecified: Secondary | ICD-10-CM

## 2017-08-19 DIAGNOSIS — G8928 Other chronic postprocedural pain: Secondary | ICD-10-CM | POA: Diagnosis not present

## 2017-08-19 DIAGNOSIS — M898X6 Other specified disorders of bone, lower leg: Secondary | ICD-10-CM

## 2017-08-19 MED ORDER — DULOXETINE HCL 30 MG PO CPEP: 30.0000 mg | ORAL_CAPSULE | Freq: Every day | ORAL | 3 refills | Status: DC

## 2017-08-19 NOTE — Patient Instructions (Addendum)
Kristen Swanson,   For your leg pain, start taking duloxetine 30 mg daily  I will also send in a referral for you to see physical therapy   Today I am testing your iron levels   Schedule a follow up appointment with me in 3 months

## 2017-08-19 NOTE — Progress Notes (Signed)
CC: follow up of anemia   HPI:  Kristen Swanson is a 41 y.o. with PMH as listed below who presents for follow up of iron deficiency anemia, and chronic post operative leg pain. Please see the assessment and plans for the status of the patient chronic medical problems.   Past Medical History:  Diagnosis Date  . ADHD (attention deficit hyperactivity disorder)   . Adjustment disorder with mixed anxiety and depressed mood   . Asthma   . Fibroid tumor   . Hypertension   . Kidney stones   . Microcytic anemia   . Pulmonary emboli ([facility])    Review of Systems:  Refer to history of present illness and assessment and plans for pertinent review of systems, all others reviewed and negative  Physical Exam:  Vitals:   08/19/17 1444  BP: (!) 142/86  Pulse: 91  Temp: 98.5 F (36.9 C)  TempSrc: Oral  SpO2: 100%  Weight: 254 lb 9.6 oz (115.5 kg)  Height: 5\' 9"  (1.753 m)   Physical Exam  Constitutional: She is well-developed, well-nourished, and in no distress. No distress.  Cardiovascular: Normal rate and regular rhythm.   No murmur heard. Pulmonary/Chest: Effort normal and breath sounds normal. No respiratory distress. She has no wheezes. She has no rales.  Musculoskeletal:  Surgical scars over the left anterior lower leg and medial knee. Tenderness to palpation over the proximal tibia, no warmth, erythema, or swelling.   Neurological: She is alert.  Skin: She is not diaphoretic.     Assessment & Plan:   Iron deficiency anemia  Secondary to menorrhagia in the setting of uterine fibroids. She just completed her period this week and reports fatigue and shortness of breath at times. Reports non adherence to ferrous gluconate. She has not yet followed up with her gynecologist and was strongly encouraged to do so.  CBC shows hgb 8.0 and transferrin saturation is 4%. She ask for an iron infusion, I will arrange for her to receive IV iron at short stay.  - scheduled IV iron transfusion at  short stay  - refilled ferrous gluconate   Family history of colon cancer  Hx of early colon cancer in her family, her mother was diagnosed at age 48 and diet at age 26. Patient had a colonoscopy at age 30 and is due for follow up. Referral was placed to GI at her office visit in March 2018, she has not made this appointment yet.  - encouraged to follow up with GI for colonoscopy   Hx of unprovoked PE  Had unprovoked bilateral PE in March 2018 and was discharged on eliquis. Today she complains of chronic leg pain in her left leg related to surgical reconstruction of that leg but is not having calf pain, swelling, or tenderness. Denies CP or SOB. She has been staying active.  -Continue Eliquis 5 mg daily, this can be held with a lovenox bridge when hysterectomy is arranged   Healthcare maintenance  Epic reflects that she is due for pap smear, she follows with outside gynecology and has had a pap smear there recently. Asked to have the results of that test sent to the [facility].   Post op leg pain  Describes aching pain in left leg at the site of reconstruction surgery where she had ORIF of tibial fracture with fixation plate and multiple screws. Pain is worse with cold weather and rain and keeps her from wanting to be active. She takes ibuprofen, tylenol, and topical pain  relievers but these do not alleviate the pain completely. Has multiple surgical scars and tenderness to palpation without warmth, swelling or limited ROM.  - Trial of duloxetine 30 mg daily   Reactive lymphoid hyperplasia  Denies fevers, chills, weight loss or noticeable new breast mass or lymph nodes. She will be due for repeat screening mammogram in may 2019.  - continue to monitor for signs or symptoms of malignancy or infection   See Encounters Tab for problem based charting.  Patient discussed with Dr. Daryll Drown

## 2017-08-20 ENCOUNTER — Encounter: Payer: Self-pay | Admitting: General Practice

## 2017-08-21 DIAGNOSIS — Z800 Family history of malignant neoplasm of digestive organs: Secondary | ICD-10-CM | POA: Insufficient documentation

## 2017-08-21 LAB — CBC
Hematocrit: 27.1 % — ABNORMAL LOW (ref 34.0–46.6)
Hemoglobin: 8 g/dL — ABNORMAL LOW (ref 11.1–15.9)
MCH: 21.3 pg — AB (ref 26.6–33.0)
MCHC: 29.5 g/dL — ABNORMAL LOW (ref 31.5–35.7)
MCV: 72 fL — ABNORMAL LOW (ref 79–97)
Platelets: 374 10*3/uL (ref 150–379)
RBC: 3.75 x10E6/uL — ABNORMAL LOW (ref 3.77–5.28)
RDW: 20.2 % — ABNORMAL HIGH (ref 12.3–15.4)
WBC: 4.2 10*3/uL (ref 3.4–10.8)

## 2017-08-21 LAB — TRANSFERRIN SATURATION
IRON SATN MFR SERPL: 4 % Saturation — ABNORMAL LOW
IRON SERPL-MCNC: 17 ug/dL — ABNORMAL LOW
TRANSFERRIN SERPL-MCNC: 313 mg/dL

## 2017-08-21 MED ORDER — FERROUS GLUCONATE 324 (38 FE) MG PO TABS: 324.0000 mg | ORAL_TABLET | Freq: Every day | ORAL | 3 refills | Status: DC

## 2017-08-21 NOTE — Assessment & Plan Note (Signed)
Epic reflects that she is due for pap smear, she follows with outside gynecology and has had a pap smear there recently. Asked to have the results of that test sent to the [facility].

## 2017-08-21 NOTE — Assessment & Plan Note (Signed)
Denies fevers, chills, weight loss or noticeable new breast mass or lymph nodes. She will be due for repeat screening mammogram in may 2019.  - continue to monitor for signs or symptoms of malignancy or infection

## 2017-08-21 NOTE — Assessment & Plan Note (Signed)
Describes aching pain in left leg at the site of reconstruction surgery where she had ORIF of tibial fracture with fixation plate and multiple screws. Pain is worse with cold weather and rain and keeps her from wanting to be active. She takes ibuprofen, tylenol, and topical pain relievers but these do not alleviate the pain completely. Has multiple surgical scars and tenderness to palpation without warmth, swelling or limited ROM.  - Trial of duloxetine 30 mg daily

## 2017-08-21 NOTE — Assessment & Plan Note (Signed)
Hx of early colon cancer in her family, her mother was diagnosed at age 41 and diet at age 76. Patient had a colonoscopy at age 61 and is due for follow up. Referral was placed to GI at her office visit in March 2018, she has not made this appointment yet.  - encouraged to follow up with GI for colonoscopy

## 2017-08-21 NOTE — Assessment & Plan Note (Signed)
Secondary to menorrhagia in the setting of uterine fibroids. She just completed her period this week and reports fatigue and shortness of breath at times. Reports non adherence to ferrous gluconate. She has not yet followed up with her gynecologist and was strongly encouraged to do so.  CBC shows hgb 8.0 and transferrin saturation is 4%. She ask for an iron infusion, I will arrange for her to receive IV iron at short stay.  - scheduled IV iron transfusion at short stay  - refilled ferrous gluconate

## 2017-08-21 NOTE — Assessment & Plan Note (Signed)
Had unprovoked bilateral PE in March 2018 and was discharged on eliquis. Today she complains of chronic leg pain in her left leg related to surgical reconstruction of that leg but is not having calf pain, swelling, or tenderness. Denies CP or SOB. She has been staying active.  -Continue Eliquis 5 mg daily, this can be held with a lovenox bridge when hysterectomy is arranged

## 2017-08-25 ENCOUNTER — Other Ambulatory Visit: Payer: Self-pay | Admitting: *Deleted

## 2017-08-25 ENCOUNTER — Telehealth: Payer: Self-pay | Admitting: *Deleted

## 2017-08-25 MED ORDER — HYDROCHLOROTHIAZIDE 25 MG PO TABS: 25.0000 mg | ORAL_TABLET | Freq: Every day | ORAL | 3 refills | Status: AC

## 2017-08-25 NOTE — Telephone Encounter (Signed)
RN called pt per VO Dr. Hetty Ely to set up iron infusion. Dr. Johnnette Gourd entered iron infusion orders in EPIC this afternoon and this RN called Short Stay and pt to set up first iron Madilyn Hook) infusion at first available appt time, which was Wednesday, September 5 at [place]. Pt to arrive at 9:45 A in Admitting. At that appt, the second infusion appt will be made for one week later, per written orders Dr. Heber Hawthorne.Pt stated back to RN all appt info and instructions correctly. Pt stated also wanted to let Dr. Hetty Ely know that after one dose of Cymbalta, she had significant n/v and h/a and did not want to take any more of that medication, asking what else she could try? This message sent to Dr. Hetty Ely.  Lastly pt c/o [facility] on [address] stating they did not get response on refill request for "blood thinner and some other med". RN asked pt to call [facility] to see if her RN was ready and triage nurse contacted to confirm all refill requests had been answered.  Kristen Swanson, 08/25/17, 3:49P

## 2017-08-25 NOTE — Addendum Note (Signed)
Addended by: Lucious Groves on: 08/25/2017 03:38 PM   Modules accepted: Orders, SmartSet

## 2017-08-25 NOTE — Telephone Encounter (Signed)
Per sharonp rn, pt c/o not having apixaban refills and pharmacy not getting responses, called pharmacy spoke w/ mark pharmacist, [facility] rec'd the refill 8/1 but when pt called for next refill it was too early for insurance to approve, he put it through as we spoke, it went through and he is calling pt now to pick up, he states no other meds need refills but I am sending the pcp a request for hctz because it looks as though it needs refills, noted this after pharmacy call ended

## 2017-08-25 NOTE — Addendum Note (Signed)
Addended by: Joni Reining C on: 08/25/2017 03:24 PM   Modules accepted: Orders, SmartSet

## 2017-08-26 NOTE — Progress Notes (Signed)
Internal Medicine Clinic Attending  Case discussed with Dr. Blum at the time of the visit.  We reviewed the resident's history and exam and pertinent patient test results.  I agree with the assessment, diagnosis, and plan of care documented in the resident's note. 

## 2017-08-26 NOTE — Addendum Note (Signed)
Addended by: Shela Leff on: 08/26/2017 05:26 PM   Modules accepted: Orders

## 2017-08-27 ENCOUNTER — Other Ambulatory Visit: Payer: Self-pay | Admitting: Internal Medicine

## 2017-08-27 NOTE — Telephone Encounter (Signed)
Requested meds not on medlist, patient stated she is out of Valtrex & also need IB. Also asking MD to call for lab results done on OV 8/21, has questions abt new meds ordered on that visit also. Ph# [phone].

## 2017-08-28 ENCOUNTER — Other Ambulatory Visit: Payer: Self-pay | Admitting: *Deleted

## 2017-08-28 DIAGNOSIS — D259 Leiomyoma of uterus, unspecified: Secondary | ICD-10-CM

## 2017-08-28 DIAGNOSIS — N920 Excessive and frequent menstruation with regular cycle: Secondary | ICD-10-CM

## 2017-08-28 MED ORDER — GABAPENTIN 100 MG PO CAPS: 100.0000 mg | ORAL_CAPSULE | Freq: Three times a day (TID) | ORAL | 3 refills | Status: DC

## 2017-08-28 NOTE — Telephone Encounter (Addendum)
Called and spoke with Kristen Swanson, she says that Cymbalta caused her to feel nauseated and lightheaded so she stopped taking it. She ask for something different to use. She says that she's had a cortisone shot for this knee pain in the past and that helped to relieve her pain and ask if we could do this in our clinic. Her pain relief lasted for about 3 months after the shot. She would like to try an oral medication now and will call the clinic if the pain continues and she wants to be evaluated for knee injection.  I will write for gabapentin 100 mg TID.

## 2017-08-28 NOTE — Addendum Note (Signed)
Addended by: Meryl Dare on: 08/28/2017 12:00 PM   Modules accepted: Orders

## 2017-09-02 ENCOUNTER — Other Ambulatory Visit (HOSPITAL_COMMUNITY): Payer: Self-pay | Admitting: *Deleted

## 2017-09-03 ENCOUNTER — Ambulatory Visit (HOSPITAL_COMMUNITY)
Admission: RE | Admit: 2017-09-03 | Discharge: 2017-09-03 | Disposition: A | Discharge status: Home / Self Care | Payer: Medicare Other | Source: Ambulatory Visit | Attending: Internal Medicine | Admitting: Internal Medicine

## 2017-09-03 DIAGNOSIS — D509 Iron deficiency anemia, unspecified: Secondary | ICD-10-CM | POA: Diagnosis not present

## 2017-09-03 MED ORDER — SODIUM CHLORIDE 0.9 % IV SOLN
510.0000 mg | INTRAVENOUS | Status: DC
  Administered 2017-09-03: 510 mg via INTRAVENOUS
  Filled 2017-09-03: qty 17

## 2017-09-03 NOTE — Discharge Instructions (Signed)

## 2017-09-11 ENCOUNTER — Ambulatory Visit (INDEPENDENT_AMBULATORY_CARE_PROVIDER_SITE_OTHER): Payer: Medicare Other | Admitting: Internal Medicine

## 2017-09-11 ENCOUNTER — Ambulatory Visit (HOSPITAL_COMMUNITY)
Admission: RE | Admit: 2017-09-11 | Discharge: 2017-09-11 | Disposition: A | Discharge status: Home / Self Care | Payer: Medicare Other | Source: Ambulatory Visit | Attending: Internal Medicine | Admitting: Internal Medicine

## 2017-09-11 VITALS — BP 147/85 | HR 80 | Temp 98.1°F | Ht 69.0 in | Wt 257.6 lb

## 2017-09-11 DIAGNOSIS — M79662 Pain in left lower leg: Secondary | ICD-10-CM

## 2017-09-11 DIAGNOSIS — M898X6 Other specified disorders of bone, lower leg: Secondary | ICD-10-CM | POA: Insufficient documentation

## 2017-09-11 DIAGNOSIS — G8929 Other chronic pain: Secondary | ICD-10-CM | POA: Diagnosis not present

## 2017-09-11 DIAGNOSIS — Z9889 Other specified postprocedural states: Secondary | ICD-10-CM | POA: Insufficient documentation

## 2017-09-11 DIAGNOSIS — M779 Enthesopathy, unspecified: Secondary | ICD-10-CM

## 2017-09-11 DIAGNOSIS — A6009 Herpesviral infection of other urogenital tract: Secondary | ICD-10-CM | POA: Insufficient documentation

## 2017-09-11 DIAGNOSIS — M25862 Other specified joint disorders, left knee: Secondary | ICD-10-CM

## 2017-09-11 DIAGNOSIS — Z969 Presence of functional implant, unspecified: Secondary | ICD-10-CM

## 2017-09-11 DIAGNOSIS — D509 Iron deficiency anemia, unspecified: Secondary | ICD-10-CM | POA: Insufficient documentation

## 2017-09-11 DIAGNOSIS — Z79899 Other long term (current) drug therapy: Secondary | ICD-10-CM | POA: Diagnosis not present

## 2017-09-11 DIAGNOSIS — D259 Leiomyoma of uterus, unspecified: Secondary | ICD-10-CM

## 2017-09-11 DIAGNOSIS — Z9181 History of falling: Secondary | ICD-10-CM | POA: Diagnosis not present

## 2017-09-11 DIAGNOSIS — M79605 Pain in left leg: Secondary | ICD-10-CM | POA: Diagnosis not present

## 2017-09-11 MED ORDER — SODIUM CHLORIDE 0.9 % IV SOLN
510.0000 mg | INTRAVENOUS | Status: DC
  Administered 2017-09-11: 510 mg via INTRAVENOUS
  Filled 2017-09-11: qty 17

## 2017-09-11 MED ORDER — DICLOFENAC SODIUM 1 % TD GEL: 2.0000 g | Freq: Four times a day (QID) | TRANSDERMAL | 6 refills | Status: DC

## 2017-09-11 MED ORDER — VALACYCLOVIR HCL 500 MG PO TABS: 500.0000 mg | ORAL_TABLET | Freq: Two times a day (BID) | ORAL | 1 refills | Status: AC

## 2017-09-11 NOTE — Patient Instructions (Signed)
It was a pleasure to meet you. Today we:  - Got x-rays of your knee. I will call you with the results.   - Start using the Voltaren gel for your pain   - You can continue the Gabapentin 300 mg at bedtime to see if that helps   - Weight loss will greatly help your pain.   - Please return if your knee pain does not improve.

## 2017-09-11 NOTE — Progress Notes (Signed)
   CC: Left leg/knee pain   HPI:  Ms.Kristen Swanson is a 41 y.o. female with a PMHx significant for chronic left leg pain since experiencing a traumatic fall 2006. She has had 5-6 surgeries since with hardware placement. She has not followed up with orthopedics since 2006. Her most recent x-rays were in 2016 that illustrated intake hardware with multiple calcifications and bone spurs. She has tried narcotics in the past but state they make her too drowsy. She has also tried multiple OTC creams with no relief. She was recently seen in August for the same issue and was given a trail of duloxetine and gabapentin. The duloxetine made her nauseous and lightheaded so she quit taking it after 2 days. She has only been taking 100 mg of the gabapentin and it has not helped her pain.   She feels her pain is more related to arthritis. The pain is described as an ache, that is worse after being on her feet all day. She has 3 kids and the pain is limiting her daily function. Pain seems to be better during the summer and worse during the cold months.   Patient also voices concerns about herpes break outs. States that she was previously on acyclovir PRN for break outs. Is not currently having a break out but would like a prescription as needed. She typically takes 2 pills for 5 days. Seems to be associated with severe stress. Not currently having a breakout.   Past Medical History:  Diagnosis Date  . ADHD (attention deficit hyperactivity disorder)   . Adjustment disorder with mixed anxiety and depressed mood   . Asthma   . Fibroid tumor   . Hypertension   . Kidney stones   . Microcytic anemia   . Pulmonary emboli (HCC)    Review of Systems:  Cardio: Denies palpitations, chest pain GI: Denies nausea, vomiting, diarrhea, constipation   Physical Exam: Vitals:   09/11/17 1436  BP: (!) 147/85  SpO2: 100%  Weight: 257 lb 9.6 oz (116.8 kg)   General: Alert and orient, obese, in no apparent distress Pulm:  Lungs clear to auscultation, no wheezing or rhonchi  CV: RRR, no murmurs, no rubs  L Knee: No tenderness to palpation of the patella, medial or lateral joint line, tibial tuberosity, or head of the fibula. Anterior and posterior drawer test negative. No clicking or popping with passive range of motion. No crepitus.  L Leg: No tenderness to palpation, scars noted on the lateral and medial calf. No swelling or erythema. Pulses palpable.   Assessment & Plan:   See Encounters Tab for problem based charting.  Patient seen with Dr. Lynnae January

## 2017-09-11 NOTE — Assessment & Plan Note (Signed)
Patients pain is most likely post surgical with some underlying arthritis. Her most recent knee x-ray in 2016 illustrated intake hardware, calcifications, and multiple bone spurs. PE was benign. I do think it is reasonable to obtain x-rays to ensure there is no an issue with the hardware in her knee. We will do a trial of voltaren gel and continue her gabapentin 300 mg QHS.   Plan: - Left knee x-rays to evaluate hardware and for arthritis - Voltaren gel 1% 2g QID - Gabapentin 300 mg QHS - Discussed weight loss

## 2017-09-11 NOTE — Progress Notes (Signed)
Internal Medicine Clinic Attending  I saw and evaluated the patient.  I personally confirmed the key portions of the history and exam documented by Dr. Helberg and I reviewed pertinent patient test results.  The assessment, diagnosis, and plan were formulated together and I agree with the documentation in the resident's note. 

## 2017-09-15 ENCOUNTER — Encounter: Payer: Self-pay | Admitting: Obstetrics & Gynecology

## 2017-09-17 ENCOUNTER — Ambulatory Visit (INDEPENDENT_AMBULATORY_CARE_PROVIDER_SITE_OTHER): Payer: Medicare Other | Admitting: Obstetrics & Gynecology

## 2017-09-17 ENCOUNTER — Other Ambulatory Visit (HOSPITAL_COMMUNITY)
Admission: RE | Admit: 2017-09-17 | Discharge: 2017-09-17 | Disposition: A | Discharge status: Home / Self Care | Payer: Medicare Other | Source: Ambulatory Visit | Attending: Obstetrics & Gynecology | Admitting: Obstetrics & Gynecology

## 2017-09-17 VITALS — BP 124/75 | HR 78 | Ht 70.0 in | Wt 245.7 lb

## 2017-09-17 DIAGNOSIS — Z124 Encounter for screening for malignant neoplasm of cervix: Secondary | ICD-10-CM | POA: Insufficient documentation

## 2017-09-17 DIAGNOSIS — N83201 Unspecified ovarian cyst, right side: Secondary | ICD-10-CM | POA: Diagnosis not present

## 2017-09-17 DIAGNOSIS — N920 Excessive and frequent menstruation with regular cycle: Secondary | ICD-10-CM | POA: Diagnosis not present

## 2017-09-17 DIAGNOSIS — D251 Intramural leiomyoma of uterus: Secondary | ICD-10-CM | POA: Diagnosis not present

## 2017-09-17 DIAGNOSIS — D250 Submucous leiomyoma of uterus: Secondary | ICD-10-CM | POA: Diagnosis not present

## 2017-09-17 NOTE — Progress Notes (Signed)
   Subjective:    Patient ID: Kristen Swanson, female    DOB: [birth year], 41 y.o.   MRN: [id number]  HPI 41 yo S AAP3 (20, 74, and 35 yo kids) here today with the issue of periods have become much heavier "horrible" since starting blood thinner for a PE in 3/18. No etiology discovered. She has a h/o a right ovarian cyst and fibroids seen on u/s done last year.   Review of Systems She is not working currently. FH- no breast/+ colon cancer in her mom/no gyn Abstinent for about a year. Uses condoms prn  Mammogram 5/18  Objective:   Physical Exam Breathing, conversing, and ambulating normally Well nourished, well hydrated black female, no apparent distress Abd- benign Bimanual- 8 week size mobile uterus, decent pubic arch for vaginal surgery        Assessment & Plan:  Menorrhagia- check CBC, gyn u/s She declines a flu vaccine today Preventative care- pap smear today

## 2017-09-17 NOTE — Progress Notes (Signed)
Pt reports that her periods are getting heavier since she has been on the blood thinner.

## 2017-09-18 LAB — CBC
HEMATOCRIT: 35.2 % (ref 34.0–46.6)
Hemoglobin: 10.5 g/dL — ABNORMAL LOW (ref 11.1–15.9)
MCH: 24.5 pg — ABNORMAL LOW (ref 26.6–33.0)
MCHC: 29.8 g/dL — ABNORMAL LOW (ref 31.5–35.7)
MCV: 82 fL (ref 79–97)
PLATELETS: 346 10*3/uL (ref 150–379)
RBC: 4.29 x10E6/uL (ref 3.77–5.28)
RDW: 26.3 % — AB (ref 12.3–15.4)
WBC: 3.6 10*3/uL (ref 3.4–10.8)

## 2017-09-18 LAB — TSH: TSH: 2.88 u[IU]/mL (ref 0.450–4.500)

## 2017-09-19 LAB — CYTOLOGY - PAP
BACTERIAL VAGINITIS: POSITIVE — AB
Candida vaginitis: NEGATIVE
Chlamydia: NEGATIVE
DIAGNOSIS: NEGATIVE
HPV: NOT DETECTED
Neisseria Gonorrhea: NEGATIVE
Trichomonas: NEGATIVE

## 2017-09-22 ENCOUNTER — Telehealth: Payer: Self-pay | Admitting: *Deleted

## 2017-09-22 MED ORDER — METRONIDAZOLE 500 MG PO TABS: 500.0000 mg | ORAL_TABLET | Freq: Two times a day (BID) | ORAL | 0 refills | Status: DC

## 2017-09-22 NOTE — Telephone Encounter (Addendum)
-----   Message from Emily Filbert, MD sent at 09/22/2017 10:28 AM EDT ----- Please offer her flagyl for + BV  1205  Called pt and informed her of test results showing +BV.  Pt states she is familiar with the Dx and treatment. She desires Tx with Flagyl po.  Rx sent to pharmacy as requested.

## 2017-09-24 ENCOUNTER — Ambulatory Visit (HOSPITAL_COMMUNITY): Payer: Medicare Other

## 2017-10-01 ENCOUNTER — Ambulatory Visit (HOSPITAL_COMMUNITY)
Admission: RE | Admit: 2017-10-01 | Discharge: 2017-10-01 | Disposition: A | Discharge status: Home / Self Care | Payer: Medicare Other | Source: Ambulatory Visit | Attending: Obstetrics & Gynecology | Admitting: Obstetrics & Gynecology

## 2017-10-01 DIAGNOSIS — D251 Intramural leiomyoma of uterus: Secondary | ICD-10-CM | POA: Insufficient documentation

## 2017-10-01 DIAGNOSIS — D259 Leiomyoma of uterus, unspecified: Secondary | ICD-10-CM | POA: Diagnosis not present

## 2017-11-03 DIAGNOSIS — F902 Attention-deficit hyperactivity disorder, combined type: Secondary | ICD-10-CM | POA: Diagnosis not present

## 2017-11-09 ENCOUNTER — Encounter (HOSPITAL_BASED_OUTPATIENT_CLINIC_OR_DEPARTMENT_OTHER): Payer: Self-pay | Admitting: Emergency Medicine

## 2017-11-09 ENCOUNTER — Emergency Department (HOSPITAL_BASED_OUTPATIENT_CLINIC_OR_DEPARTMENT_OTHER)
Admission: EM | Admit: 2017-11-09 | Discharge: 2017-11-09 | Disposition: A | Discharge status: Home / Self Care | Payer: Medicare Other | Attending: Emergency Medicine | Admitting: Emergency Medicine

## 2017-11-09 ENCOUNTER — Other Ambulatory Visit: Payer: Self-pay

## 2017-11-09 ENCOUNTER — Emergency Department (HOSPITAL_BASED_OUTPATIENT_CLINIC_OR_DEPARTMENT_OTHER): Payer: Medicare Other

## 2017-11-09 DIAGNOSIS — Y998 Other external cause status: Secondary | ICD-10-CM | POA: Diagnosis not present

## 2017-11-09 DIAGNOSIS — S8992XA Unspecified injury of left lower leg, initial encounter: Secondary | ICD-10-CM | POA: Diagnosis not present

## 2017-11-09 DIAGNOSIS — I10 Essential (primary) hypertension: Secondary | ICD-10-CM | POA: Diagnosis not present

## 2017-11-09 DIAGNOSIS — Y9301 Activity, walking, marching and hiking: Secondary | ICD-10-CM | POA: Diagnosis not present

## 2017-11-09 DIAGNOSIS — M25562 Pain in left knee: Secondary | ICD-10-CM | POA: Diagnosis not present

## 2017-11-09 DIAGNOSIS — Z79899 Other long term (current) drug therapy: Secondary | ICD-10-CM | POA: Diagnosis not present

## 2017-11-09 DIAGNOSIS — Y929 Unspecified place or not applicable: Secondary | ICD-10-CM | POA: Diagnosis not present

## 2017-11-09 DIAGNOSIS — J45909 Unspecified asthma, uncomplicated: Secondary | ICD-10-CM | POA: Diagnosis not present

## 2017-11-09 DIAGNOSIS — W01198A Fall on same level from slipping, tripping and stumbling with subsequent striking against other object, initial encounter: Secondary | ICD-10-CM | POA: Diagnosis not present

## 2017-11-09 DIAGNOSIS — S8002XA Contusion of left knee, initial encounter: Secondary | ICD-10-CM | POA: Insufficient documentation

## 2017-11-09 DIAGNOSIS — Z96652 Presence of left artificial knee joint: Secondary | ICD-10-CM | POA: Diagnosis not present

## 2017-11-09 DIAGNOSIS — Z7901 Long term (current) use of anticoagulants: Secondary | ICD-10-CM | POA: Diagnosis not present

## 2017-11-09 MED ORDER — NAPROXEN 500 MG PO TABS
500.0000 mg | ORAL_TABLET | Freq: Two times a day (BID) | ORAL | 0 refills | Status: DC
Start: 2017-11-09 — End: 2017-11-13

## 2017-11-09 MED ORDER — HYDROCODONE-ACETAMINOPHEN 5-325 MG PO TABS: 2.0000 | ORAL_TABLET | ORAL | 0 refills | Status: DC | PRN

## 2017-11-09 MED ORDER — IBUPROFEN 800 MG PO TABS
800.0000 mg | ORAL_TABLET | Freq: Once | ORAL | Status: AC
  Administered 2017-11-09: 800 mg via ORAL
  Filled 2017-11-09: qty 1

## 2017-11-09 NOTE — ED Provider Notes (Signed)
[facility]PART[facility] Provider Note   CSN: [id number] Arrival date & time: 11/09/17  1153     History   Chief Complaint Chief Complaint  Patient presents with  . Fall    HPI Kristen Swanson is a 41 y.o. female.  HPI  The patient states that she was walking at home, she tripped over a high heel that she was wearing on a wooden floor and fell straight forward striking the anterior left knee onto the hardwood surface.  She has been able to ambulate but with significant pain.  Worse with ambulation, better with keeping the leg up, no associated swelling or redness, she has had prior surgery to the knee which makes her more worried.  He did take ibuprofen with minimal improvement, this occurred this morning  Past Medical History:  Diagnosis Date  . ADHD (attention deficit hyperactivity disorder)   . Adjustment disorder with mixed anxiety and depressed mood   . Asthma   . Fibroid tumor   . Hypertension   . Kidney stones   . Microcytic anemia   . Pulmonary emboli [facility])     Patient Active Problem List   Diagnosis Date Noted  . Herpes genitalis in women 09/11/2017  . Family history of colon cancer in mother 08/21/2017  . Pain in left tibia 06/12/2017  . 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 Healthcare maintenance 03/26/2017  . HTN (hypertension) 03/26/2017  . Pulmonary emboli ([place]) 03/19/2017  . Microcytic anemia   . Fibroid uterus 01/01/2016  . Menorrhagia 01/01/2016  . Obesity 02/26/2007  . Asthma 02/26/2007    Past Surgical History:  Procedure Laterality Date  . LEG SURGERY    . REPLACEMENT TOTAL KNEE Left 2008    OB History    Gravida Para Term Preterm AB Living   5 3 3   2 3    SAB TAB Ectopic Multiple Live Births   1               Home Medications    Prior to Admission medications   Medication Sig Start Date End Date Taking? Authorizing Provider  amphetamine-dextroamphetamine (ADDERALL) 10 MG tablet Take 10 mg daily with  breakfast by mouth.   Yes [provider]  acetaminophen (TYLENOL) 325 MG tablet Take 2 tablets (650 mg total) by mouth every 6 (six) hours as needed for moderate pain. 03/21/17   Thurnell Lose, MD  albuterol (PROVENTIL HFA;VENTOLIN HFA) 108 (90 Base) MCG/ACT inhaler Inhale 2 puffs into the lungs every 4 (four) hours as needed for wheezing or shortness of breath. 10/14/16   Delsa Grana, PA-C  apixaban (ELIQUIS) 5 MG TABS tablet Take 1 tablet (5 mg total) by mouth 2 (two) times daily. 07/30/17   Ledell Noss, MD  diclofenac sodium (VOLTAREN) 1 % GEL Apply 2 g topically 4 (four) times daily. 09/11/17   Ina Homes, MD  docusate sodium (COLACE) 100 MG capsule Take 2 capsules (200 mg total) by mouth 2 (two) times daily. 03/21/17   Thurnell Lose, MD  ferrous gluconate (FERGON) 324 MG tablet Take 1 tablet (324 mg total) by mouth daily with breakfast. 08/21/17   Ledell Noss, MD  gabapentin (NEURONTIN) 100 MG capsule Take 1 capsule (100 mg total) by mouth 3 (three) times daily. 08/28/17   Ledell Noss, MD  hydrochlorothiazide (HYDRODIURIL) 25 MG tablet Take 1 tablet (25 mg total) by mouth daily. 08/25/17   Ledell Noss, MD  HYDROcodone-acetaminophen (NORCO/VICODIN) 5-325 MG tablet Take 2 tablets every  4 (four) hours as needed by mouth. 11/09/17   Noemi Chapel, MD  Multiple Vitamin (MULTIVITAMIN WITH MINERALS) TABS tablet Take 1 tablet by mouth daily.    [provider]  naproxen (NAPROSYN) 500 MG tablet Take 1 tablet (500 mg total) 2 (two) times daily with a meal by mouth. 11/09/17   Noemi Chapel, MD  polyethylene glycol [facility]) packet Take 17 g by mouth daily as needed. 06/12/17   Shela Leff, MD  zolpidem (AMBIEN) 10 MG tablet Take 10 mg by mouth at bedtime.  11/20/15   [provider]    Family History Family History  Problem Relation Age of Onset  . Colon cancer Mother 56  . Stroke Father   . Hypertension Father   . Diabetes type I Daughter   . Autism Son   .  Lung cancer Maternal Uncle     Social History Social History   Tobacco Use  . Smoking status: Never Smoker  . Smokeless tobacco: Never Used  Substance Use Topics  . Alcohol use: No  . Drug use: No     Allergies   Patient has no known allergies.   Review of Systems Review of Systems  Musculoskeletal: Positive for arthralgias and joint swelling.  Neurological: Negative for weakness and numbness.     Physical Exam Updated Vital Signs BP 137/77   Pulse 78   Temp 98.2 F (36.8 C) (Oral)   Resp 18   Ht 5\' 9"  (1.753 m)   Wt 108.9 kg (240 lb)   LMP 11/04/2017   SpO2 100%   BMI 35.44 kg/m   Physical Exam  Constitutional: She appears well-developed and well-nourished. No distress.  HENT:  Head: Normocephalic and atraumatic.  Eyes: Conjunctivae are normal. No scleral icterus.  Cardiovascular: Normal rate, regular rhythm and intact distal pulses.  Pulmonary/Chest: Effort normal and breath sounds normal.  Musculoskeletal: She exhibits tenderness. She exhibits no edema.  Decreased range of motion of the left knee, she is able to straight leg raise, she has normal range of motion of the hip and the ankle on the left.  There is tenderness over the patella, there is no obvious joint effusion.  Neurological: She is alert.  Normal sensation and strength.  Skin: Skin is warm and dry. No rash noted. She is not diaphoretic.  Nursing note and vitals reviewed.    ED Treatments / Results  Labs (all labs ordered are listed, but only abnormal results are displayed) Labs Reviewed - No data to display   Radiology Dg Knee Complete 4 Views Left  Result Date: 11/09/2017 CLINICAL DATA:  Status post fall on hardwood floors. Left knee pain. EXAM: LEFT KNEE - COMPLETE 4+ VIEW COMPARISON:  09/11/2017 FINDINGS: No evidence of fracture, or dislocation. Tiny suprapatellar joint effusion. Stable postsurgical and posttraumatic changes with intact hardware. Minimal anterior soft tissue  swelling inferior to the patella. IMPRESSION: No acute fracture or dislocation identified about the left knee. Electronically Signed   By: Fidela Salisbury M.D.   On: 11/09/2017 12:45    Procedures Procedures (including critical care time)  Medications Ordered in ED Medications  ibuprofen (ADVIL,MOTRIN) tablet 800 mg (not administered)     Initial Impression / Assessment and Plan / ED Course  I have reviewed the triage vital signs and the nursing notes.  Pertinent labs & imaging results that were available during my care of the patient were reviewed by me and considered in my medical decision making (see chart for details).  X-rays of the knee show no fracture or dislocations, there is a very tiny effusion present.  Hardware appears intact.  Patient was informed of these findings, anti-inflammatories, immobilization, partial weightbearing and follow-up with orthopedics.  Final Clinical Impressions(s) / ED Diagnoses   Final diagnoses:  Contusion of left knee, initial encounter    ED Discharge Orders        Ordered    naproxen (NAPROSYN) 500 MG tablet  2 times daily with meals     11/09/17 1314    HYDROcodone-acetaminophen (NORCO/VICODIN) 5-325 MG tablet  Every 4 hours PRN     11/09/17 1314       Noemi Chapel, MD 11/09/17 1321

## 2017-11-09 NOTE — Discharge Instructions (Signed)
Your xrays show no signs of fractures Please follow up with Dr. Barbaraann Barthel this week Use the crutches and knee immobilizer for comfort when walking Motrin for pain, Hydrocodone for severe pain Er for severe or worsening symptoms.

## 2017-11-09 NOTE — ED Triage Notes (Signed)
Pt slipped at home while wearing high heels landing on L knee. Pt c/o pain to the knee. Hx of surgery to that knee.

## 2017-11-13 ENCOUNTER — Other Ambulatory Visit: Payer: Self-pay | Admitting: Internal Medicine

## 2017-11-13 MED ORDER — APIXABAN 5 MG PO TABS: 5.0000 mg | ORAL_TABLET | Freq: Two times a day (BID) | ORAL | 4 refills | Status: DC

## 2017-11-13 NOTE — Telephone Encounter (Signed)
Patient is out of medicine for her Eliquis, pls call.

## 2017-11-13 NOTE — Telephone Encounter (Signed)
Pt called /informed of refill. Stated she had called the pharmacy on the 7th and pharmacy stated they sent fax and have confirmations for 2 different days. Told her I'm not sure what happened  - she stated glad it was done today b/c she's out of med and never wants to be. And stated "who would had been liable if something happened to me".

## 2017-11-17 ENCOUNTER — Ambulatory Visit (INDEPENDENT_AMBULATORY_CARE_PROVIDER_SITE_OTHER): Payer: Medicare Other | Admitting: Family Medicine

## 2017-11-17 ENCOUNTER — Encounter: Payer: Self-pay | Admitting: Family Medicine

## 2017-11-17 VITALS — BP 136/89 | HR 94 | Ht 70.0 in | Wt 240.0 lb

## 2017-11-17 DIAGNOSIS — S8992XA Unspecified injury of left lower leg, initial encounter: Secondary | ICD-10-CM | POA: Diagnosis not present

## 2017-11-17 DIAGNOSIS — S8002XA Contusion of left knee, initial encounter: Secondary | ICD-10-CM | POA: Diagnosis present

## 2017-11-17 MED ORDER — HYDROCODONE-ACETAMINOPHEN 5-325 MG PO TABS: 1.0000 | ORAL_TABLET | Freq: Four times a day (QID) | ORAL | 0 refills | Status: DC | PRN

## 2017-11-17 NOTE — Patient Instructions (Signed)
You have a knee contusion. Your exam is otherwise reassuring. Switch to a knee brace or sleeve for support. Use the immobilizer only rarely. Tylenol 500mg  1-2 tabs three times a day for pain. Norco if needed for severe pain (each tablet has 325mg  tylenol in it - you don't want to take more than 4000mg  of tylenol in a day). Topical capsaicin up to 4 times a day. Biofreeze and salon pas are other topical medications that can be used with the above. Icing 15 minutes at a time 3-4 times a day at least. Do simple motion exercises of the knee and straight leg raises 3 sets of 10 once a day. Start physical therapy as well. Follow up with me in 1 month for reevaluation.

## 2017-11-21 IMAGING — CR DG CHEST 2V
2 series · 2 of 2 positions shown · non-contrast
Comparison: 01/31/2015

CLINICAL DATA: Generalized chest pain and productive cough for 5
days. Nonsmoker. History of asthma.

EXAM:
CHEST  2 VIEW

[w chest pa]
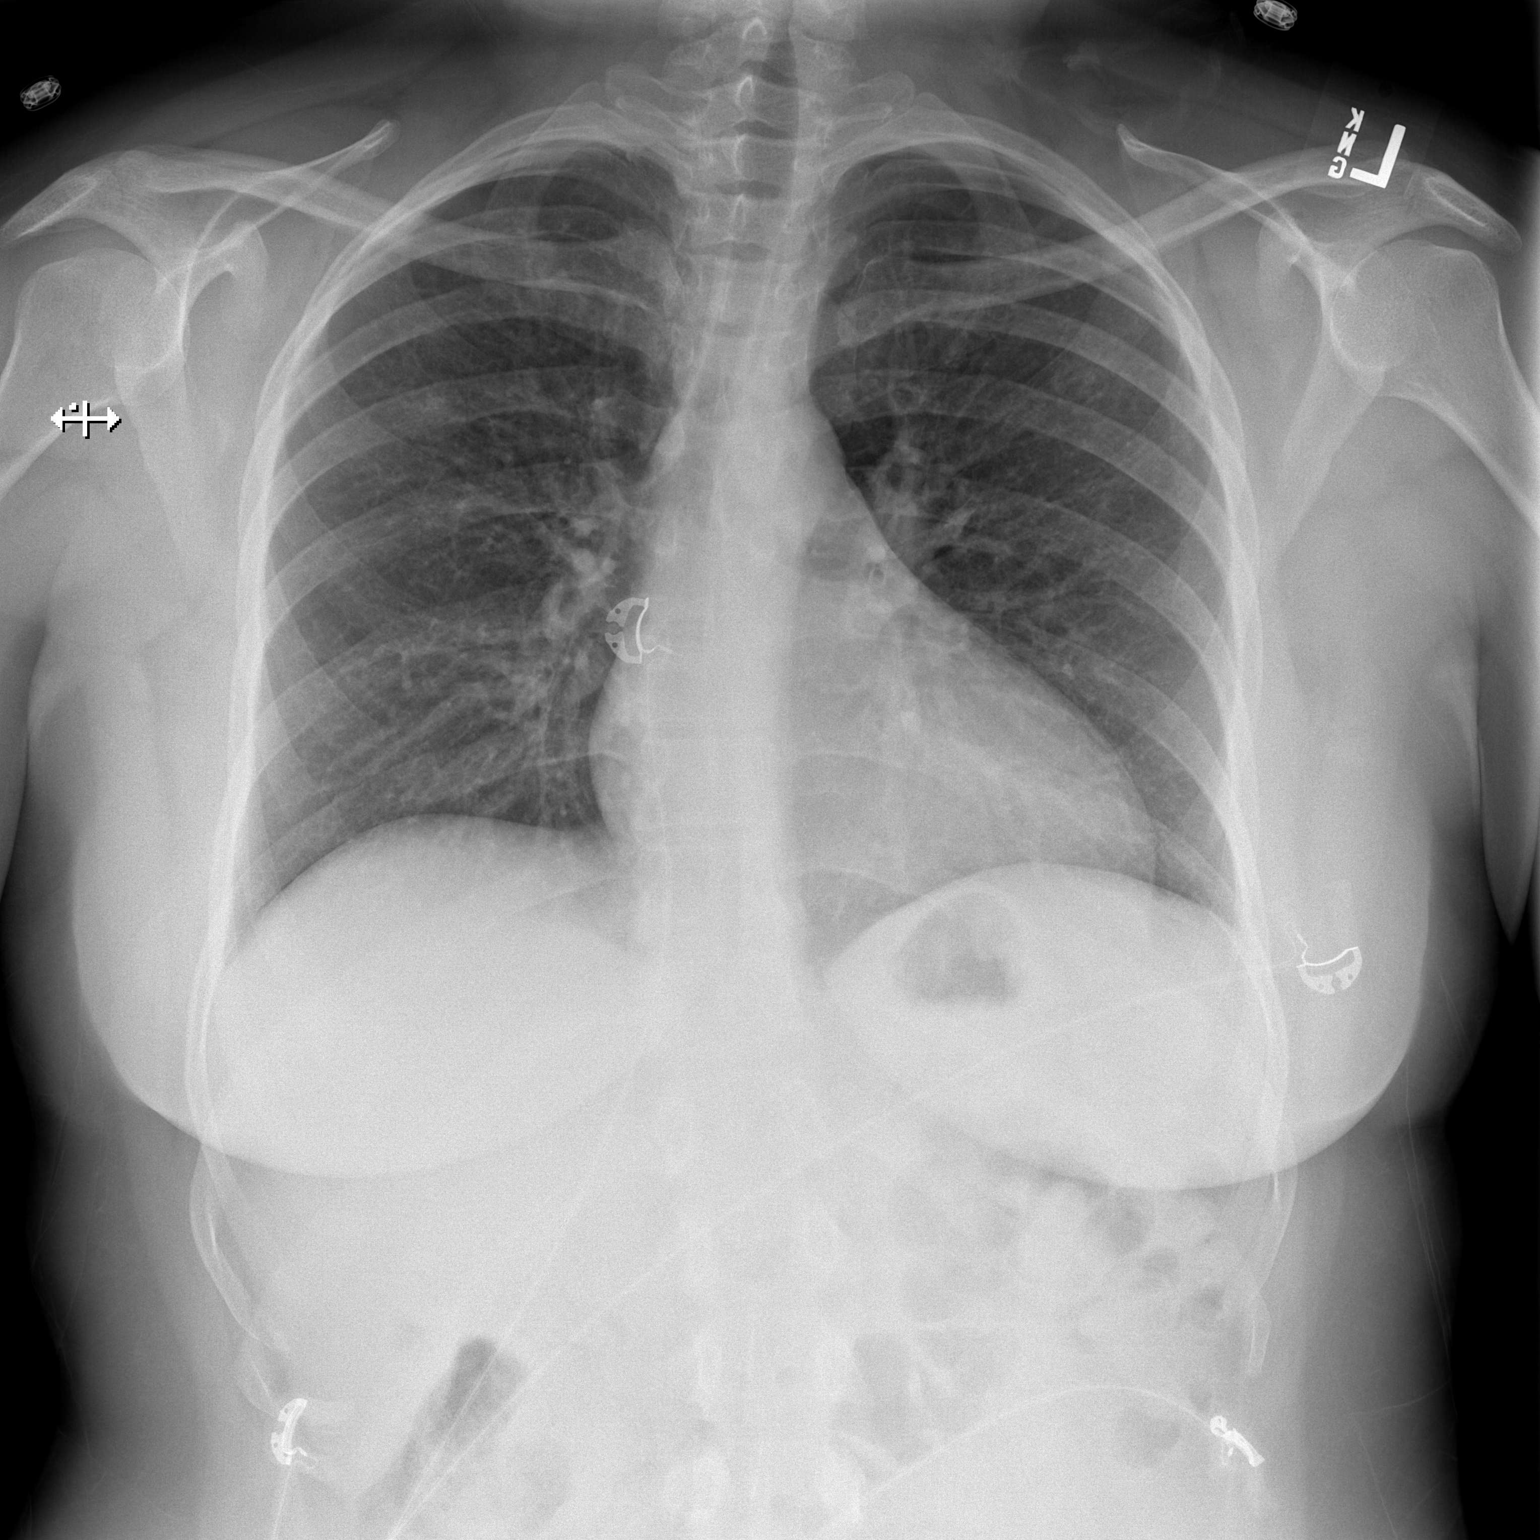

[w chest lat]
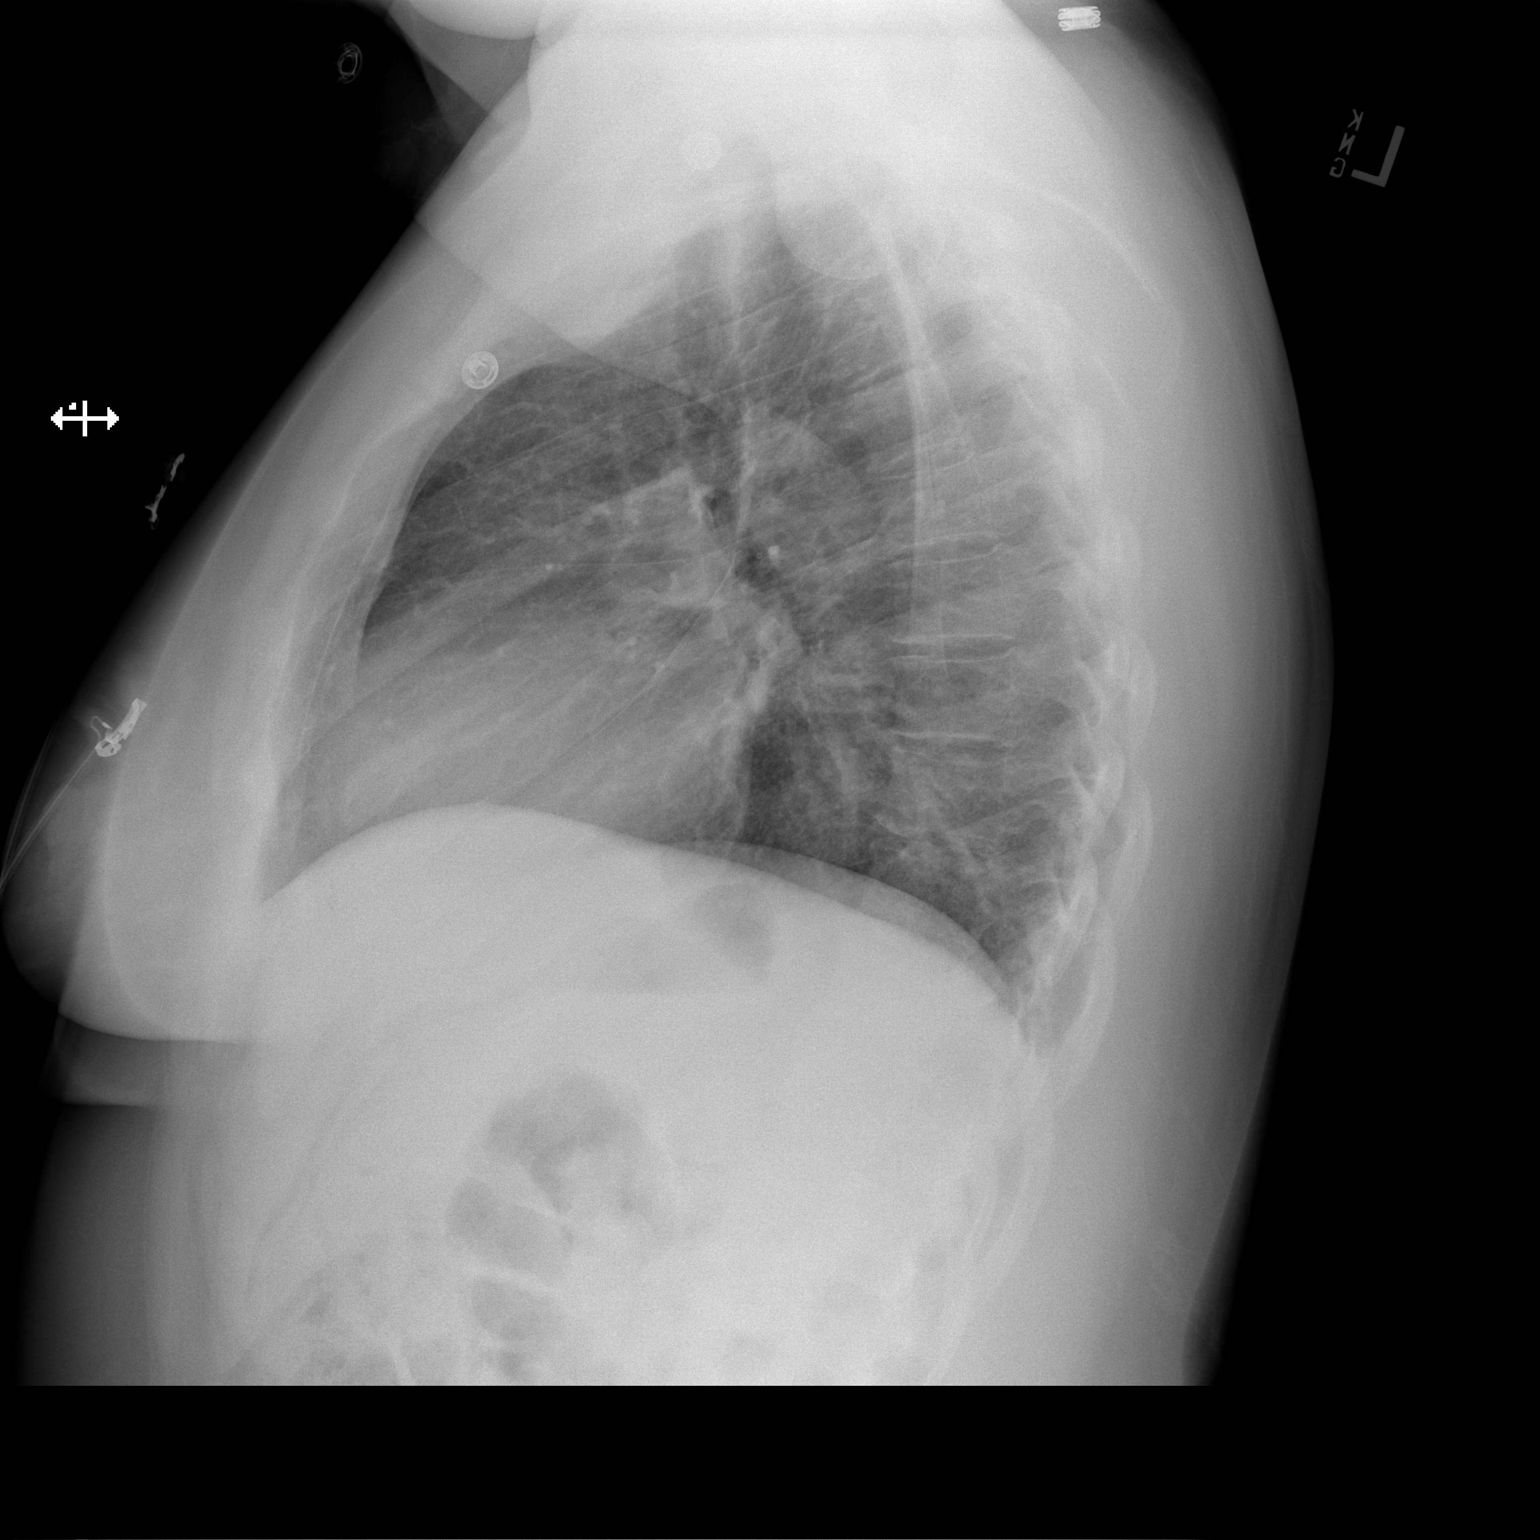

[2 of 2 positions shown; findings below may reference images not displayed]

FINDINGS: The heart size and mediastinal contours are within normal limits.
Both lungs are clear. The visualized skeletal structures demonstrate
mild dextro convex curvature of the thoracic spine, unchanged in
appearance.
IMPRESSION: No acute cardiopulmonary disease.

## 2017-11-21 IMAGING — CT CT RENAL STONE PROTOCOL
2 of 3 series · 15 of 46 positions shown, 17 images · non-contrast
Comparison: Pelvic ultrasound 12/16/2011

CLINICAL DATA: Abdominal pain localizing to right lower quadrant
and radiating to lower back.

EXAM:
CT ABDOMEN AND PELVIS WITHOUT CONTRAST
TECHNIQUE: Multidetector CT imaging of the abdomen and pelvis was performed
following the standard protocol without IV contrast.

[Series 4: coronal · coronal · 0.74mm/px · 3 of 191 slices shown]
[im 64/191  soft-tissue]
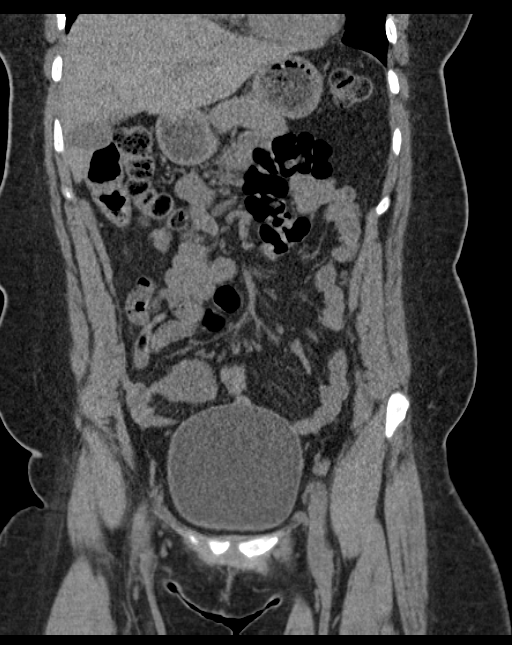
[im 85/191  soft-tissue]
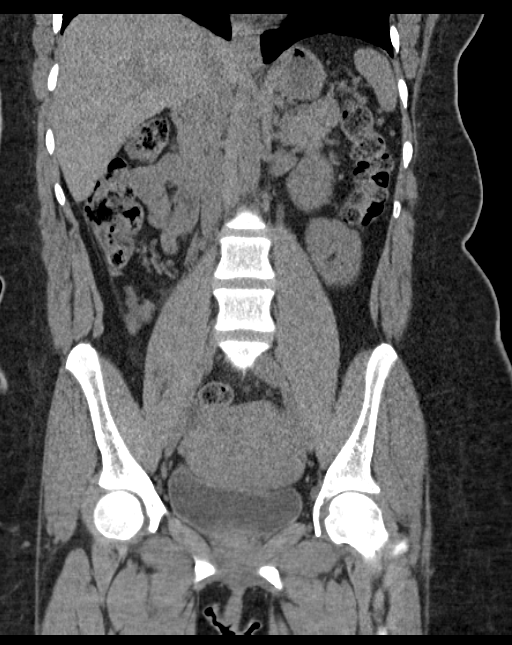
[im 106/191  soft-tissue]
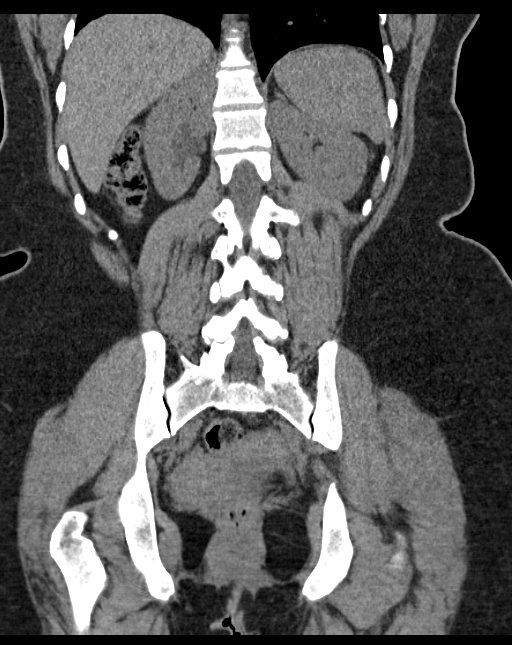

[Series 7: lung · axial · 0.78mm/px · z∈[+259,+313]mm · 12 of 32 slices shown, 14 images]
[im 3/32  soft-tissue]
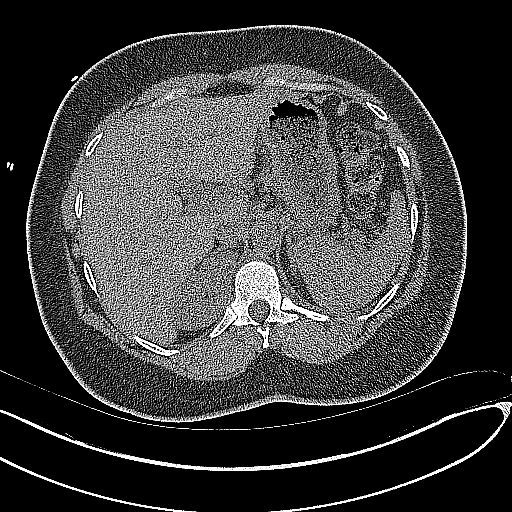
[im 3/32  bone]
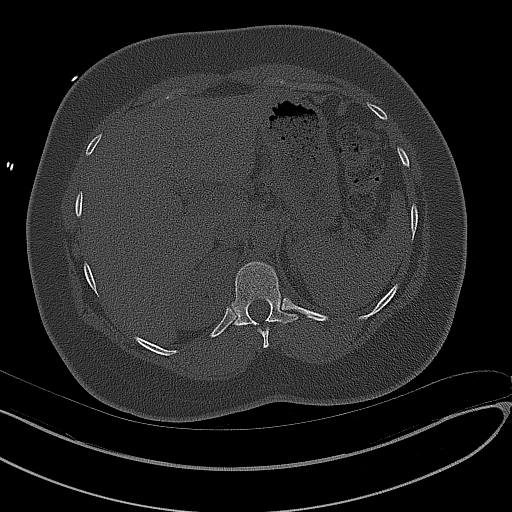
[im 5/32  soft-tissue]
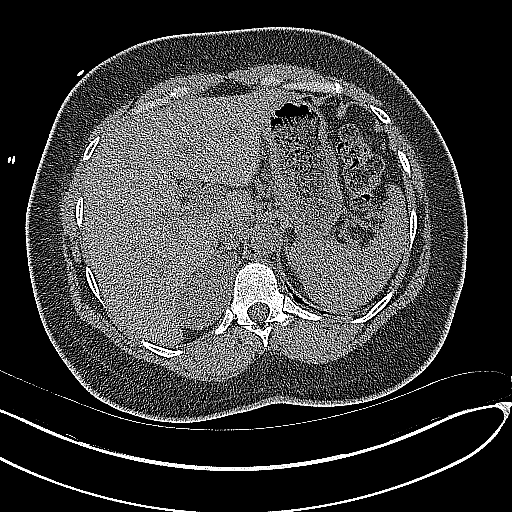
[im 8/32  soft-tissue]
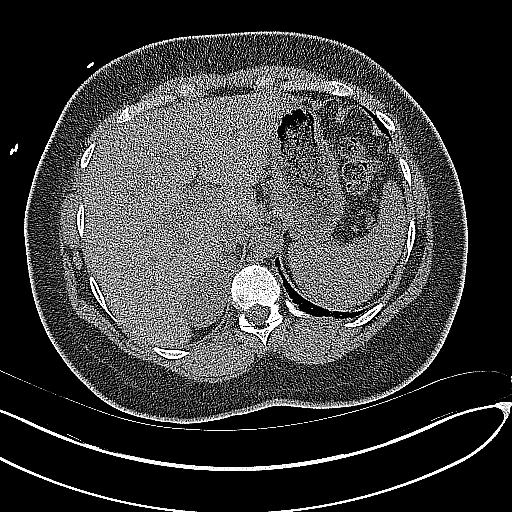
[im 10/32  soft-tissue]
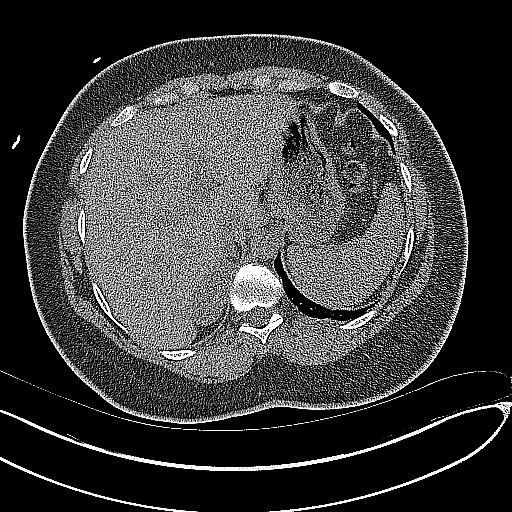
[im 13/32  soft-tissue]
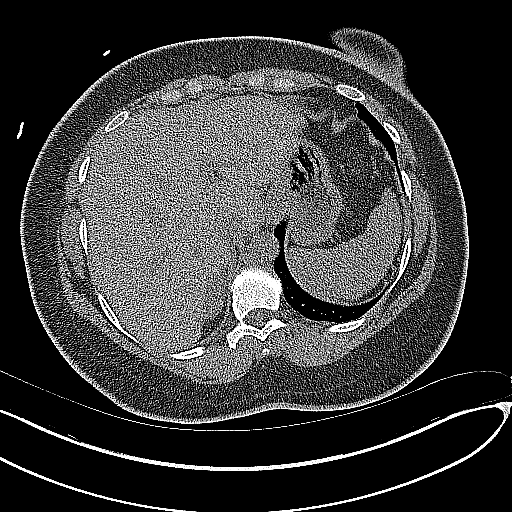
[im 15/32  soft-tissue]
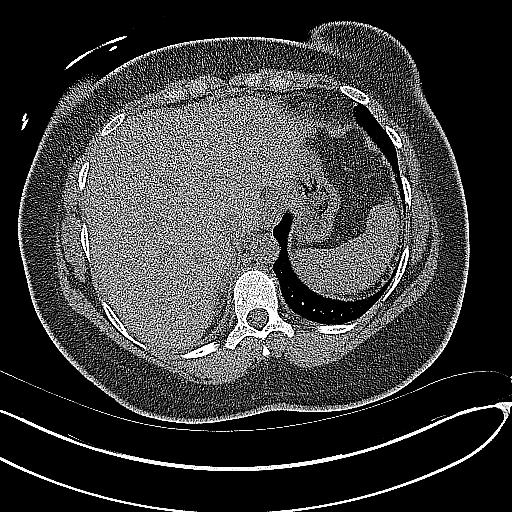
[im 18/32  soft-tissue]
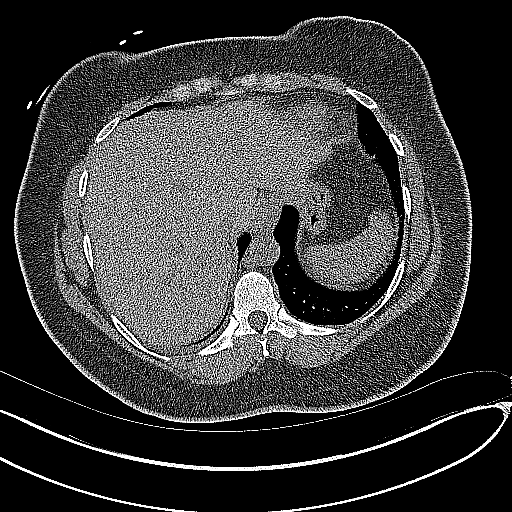
[im 20/32  soft-tissue]
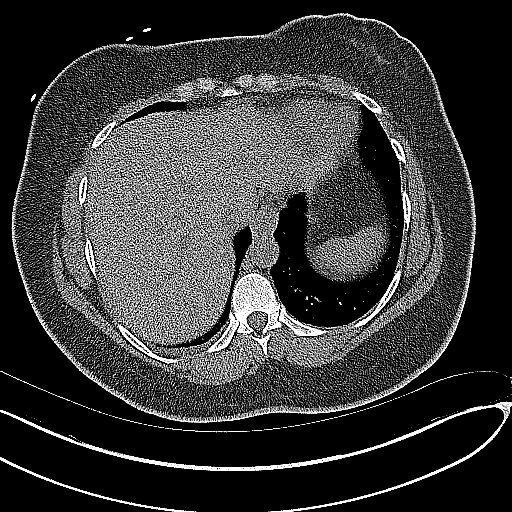
[im 23/32  soft-tissue]
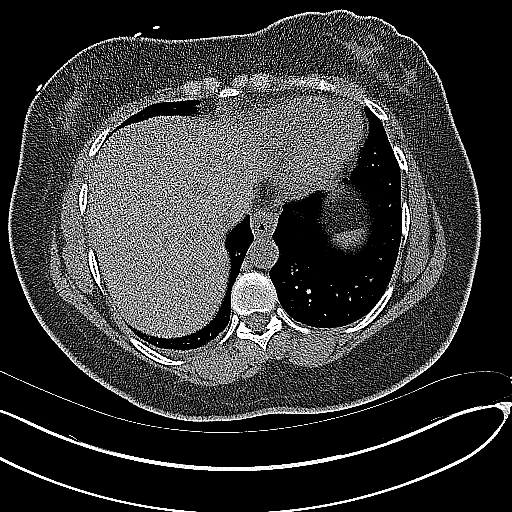
[im 23/32  bone]
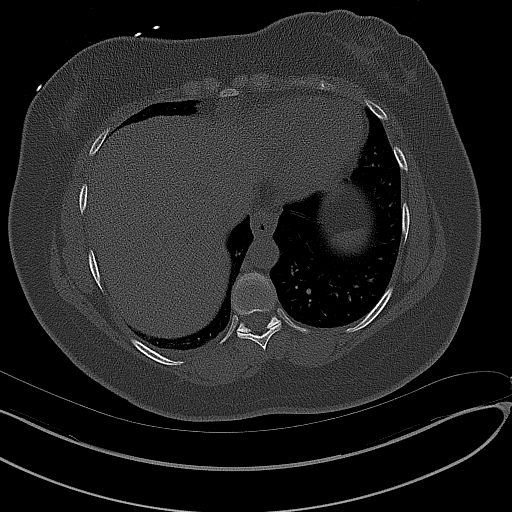
[im 25/32  soft-tissue]
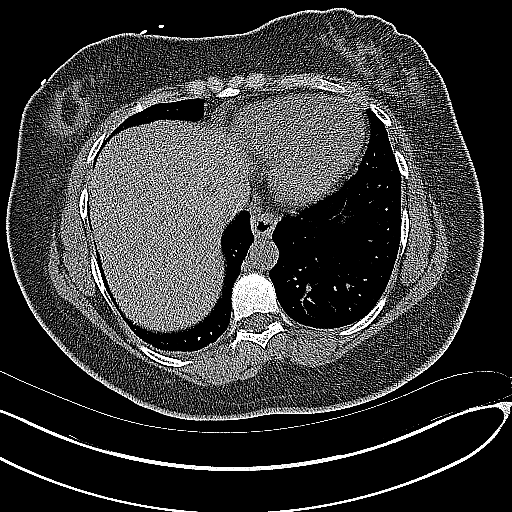
[im 28/32  soft-tissue]
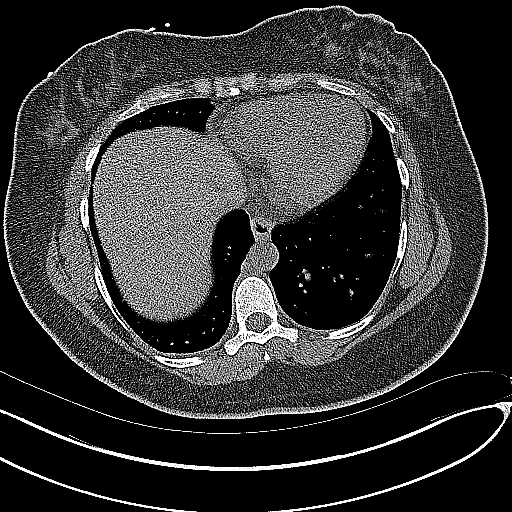
[im 30/32  soft-tissue]
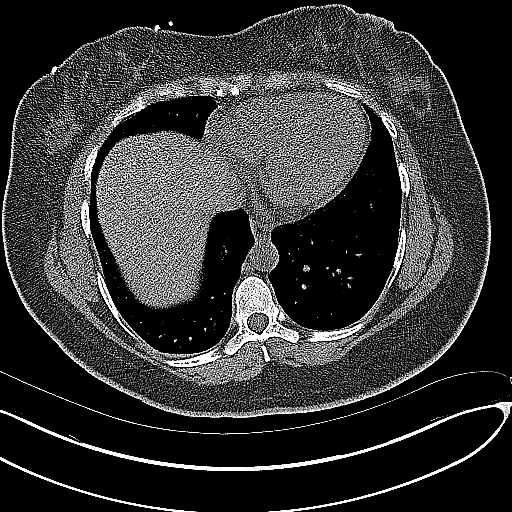

[15 of 46 positions shown; findings below may reference images not displayed]

FINDINGS: LOWER CHEST: Lung bases are clear. Included heart size is normal. No
pericardial effusion.

HEPATOBILIARY: Liver and gallbladder are normal.

PANCREAS: Normal.

SPLEEN: Normal.

ADRENALS/URINARY TRACT: Kidneys are orthotopic. There is mild
right-sided hydroureteronephrosis leading up to a 4.4 x 4.7 x 4.5 cm
right adnexal mass believed to be the right ovary which appears
enlarged and contains subtle area of hypodensity within possibly a
cyst. Further characterization with ultrasound may prove useful. No
definite ureteral stone. The unopacified ureters are normal in
course and caliber on the left and normal in caliber distal to the
right adnexal mass. Conceivably this could be due to extrinsic mass
effect on the ureter by the adnexal lesion. There [DATE] punctate
calcification in the mid left kidney that is nonobstructing, series
2, image 22. Urinary bladder is physiologically distended and
unremarkable. Normal adrenal glands.

STOMACH/BOWEL: The stomach, small and large bowel are normal in
course and caliber without inflammatory changes. Normal appendix.

VASCULAR/LYMPHATIC: Aortoiliac vessels are normal in course and
caliber. No lymphadenopathy by CT size criteria.

REPRODUCTIVE: There appears to be a right adnexal mass as described
above possibly representing the right ovary with cystic component
that may be further correlated with ultrasound. The uterus is bulky
in appearance consistent with fibroids. The largest appears to be
slightly sub serosal and posterior on the right.

OTHER: No intraperitoneal free fluid or free air.

MUSCULOSKELETAL: Nonacute.
IMPRESSION: 4.4 x 4.7 x 4.5 cm right adnexal mass believed to be the right ovary
containing areas of hypodensity consistent with a cystic component.
Further correlation with ultrasound may prove beneficial. This may
be causing extrinsic mass effect on the right ureter causing mild
right-sided hydroureteronephrosis. No obstructing calculus is
identified.

Nonobstructing left renal calcification.

## 2017-11-22 ENCOUNTER — Encounter: Payer: Self-pay | Admitting: Family Medicine

## 2017-11-22 DIAGNOSIS — S8992XD Unspecified injury of left lower leg, subsequent encounter: Secondary | ICD-10-CM | POA: Insufficient documentation

## 2017-11-22 NOTE — Progress Notes (Signed)
PCP: Ledell Noss, MD  Subjective:   HPI: Patient is a 41 y.o. female here for left knee injury.  Patient reports on 11/11 she was wearing boots, slipped on a hardwood floor and landed directly onto left knee. + swelling and bruising anteriorly. Pain level 9/10 and sharp, worse with walking. She is using topical voltaren, wearing immobilizer. She is on blood thinner due to PE. No other skin changes, numbness. No prior left knee injuries.  Past Medical History:  Diagnosis Date  . ADHD (attention deficit hyperactivity disorder)   . Adjustment disorder with mixed anxiety and depressed mood   . Asthma   . Fibroid tumor   . Hypertension   . Kidney stones   . Microcytic anemia   . Pulmonary emboli [facility])     Current Outpatient Medications on File Prior to Visit  Medication Sig Dispense Refill  . acetaminophen (TYLENOL) 325 MG tablet Take 2 tablets (650 mg total) by mouth every 6 (six) hours as needed for moderate pain. 25 tablet 0  . albuterol (PROVENTIL HFA;VENTOLIN HFA) 108 (90 Base) MCG/ACT inhaler Inhale 2 puffs into the lungs every 4 (four) hours as needed for wheezing or shortness of breath. 1 Inhaler 3  . amphetamine-dextroamphetamine (ADDERALL) 10 MG tablet Take 10 mg daily with breakfast by mouth.    Marland Kitchen apixaban (ELIQUIS) 5 MG TABS tablet Take 1 tablet (5 mg total) 2 (two) times daily by mouth. 60 tablet 4  . diclofenac sodium (VOLTAREN) 1 % GEL Apply 2 g topically 4 (four) times daily. 100 g 6  . docusate sodium (COLACE) 100 MG capsule Take 2 capsules (200 mg total) by mouth 2 (two) times daily. 10 capsule 0  . DULoxetine (CYMBALTA) 30 MG capsule TK ONE C PO  D  3  . ferrous gluconate (FERGON) 324 MG tablet Take 1 tablet (324 mg total) by mouth daily with breakfast. 90 tablet 3  . gabapentin (NEURONTIN) 100 MG capsule Take 1 capsule (100 mg total) by mouth 3 (three) times daily. 90 capsule 3  . hydrochlorothiazide (HYDRODIURIL) 25 MG tablet Take 1 tablet (25 mg total) by mouth  daily. 90 tablet 3  . Multiple Vitamin (MULTIVITAMIN WITH MINERALS) TABS tablet Take 1 tablet by mouth daily.    . polyethylene glycol (MIRALAX) packet Take 17 g by mouth daily as needed. 28 each 0  . zolpidem (AMBIEN) 10 MG tablet Take 10 mg by mouth at bedtime.      No current facility-administered medications on file prior to visit.     Past Surgical History:  Procedure Laterality Date  . LEG SURGERY    . REPLACEMENT TOTAL KNEE Left 2008    No Known Allergies  Social History   Socioeconomic History  . Marital status: Single    Spouse name: Not on file  . Number of children: Not on file  . Years of education: Not on file  . Highest education level: Not on file  Social Needs  . Financial resource strain: Not on file  . Food insecurity - worry: Not on file  . Food insecurity - inability: Not on file  . Transportation needs - medical: Not on file  . Transportation needs - non-medical: Not on file  Occupational History  . Not on file  Tobacco Use  . Smoking status: Never Smoker  . Smokeless tobacco: Never Used  Substance and Sexual Activity  . Alcohol use: No  . Drug use: No  . Sexual activity: No    Birth control/protection:  None  Other Topics Concern  . Not on file  Social History Narrative  . Not on file    Family History  Problem Relation Age of Onset  . Colon cancer Mother 50  . Stroke Father   . Hypertension Father   . Diabetes type I Daughter   . Autism Son   . Lung cancer Maternal Uncle     BP 136/89   Pulse 94   Ht 5\' 10"  (1.778 m)   Wt 240 lb (108.9 kg)   LMP 11/04/2017   BMI 34.44 kg/m   Review of Systems: See HPI above.     Objective:  Physical Exam:  Gen: NAD, comfortable in exam room  Left knee: No gross deformity, ecchymoses, effusion. TTP anterior patella, patellar tendon.  No other tenderness. ROM 0 - 90 degrees, pain with all motions.  5/5 strength flexion and extension without pain. Negative ant/post drawers. Negative  valgus/varus testing. Negative lachmanns. Negative mcmurrays, apleys, patellar apprehension. NV intact distally.  Right knee: No gross deformity, ecchymoses, swelling. No TTP. FROM with full strength. Negative ant/post drawers. Negative valgus/varus testing. Negative lachmanns. NV intact distally.   Assessment & Plan:  1. Left knee injury - independently reviewed radiographs and no fracture.  Ultrasound without effusion and exam is reassuring.  2/2 contusion.  Switch to knee brace.  Tylenol or norco for pain.  Topical medications discussed as well.  Icing, motion exercises, start PT.  F/u in 1 month.

## 2017-11-22 NOTE — Assessment & Plan Note (Signed)
independently reviewed radiographs and no fracture.  Ultrasound without effusion and exam is reassuring.  2/2 contusion.  Switch to knee brace.  Tylenol or norco for pain.  Topical medications discussed as well.  Icing, motion exercises, start PT.  F/u in 1 month.

## 2017-11-25 ENCOUNTER — Other Ambulatory Visit: Payer: Self-pay

## 2017-11-25 ENCOUNTER — Emergency Department (HOSPITAL_BASED_OUTPATIENT_CLINIC_OR_DEPARTMENT_OTHER): Payer: Medicare Other

## 2017-11-25 ENCOUNTER — Emergency Department (HOSPITAL_BASED_OUTPATIENT_CLINIC_OR_DEPARTMENT_OTHER)
Admission: EM | Admit: 2017-11-25 | Discharge: 2017-11-25 | Disposition: A | Discharge status: Home / Self Care | Payer: Medicare Other | Attending: Emergency Medicine | Admitting: Emergency Medicine

## 2017-11-25 ENCOUNTER — Encounter (HOSPITAL_BASED_OUTPATIENT_CLINIC_OR_DEPARTMENT_OTHER): Payer: Self-pay

## 2017-11-25 DIAGNOSIS — Z79899 Other long term (current) drug therapy: Secondary | ICD-10-CM | POA: Diagnosis not present

## 2017-11-25 DIAGNOSIS — I10 Essential (primary) hypertension: Secondary | ICD-10-CM | POA: Insufficient documentation

## 2017-11-25 DIAGNOSIS — Z7901 Long term (current) use of anticoagulants: Secondary | ICD-10-CM | POA: Diagnosis not present

## 2017-11-25 DIAGNOSIS — R079 Chest pain, unspecified: Secondary | ICD-10-CM | POA: Diagnosis not present

## 2017-11-25 DIAGNOSIS — R0789 Other chest pain: Secondary | ICD-10-CM | POA: Insufficient documentation

## 2017-11-25 LAB — CBC WITH DIFFERENTIAL/PLATELET
Basophils Absolute: 0 10*3/uL (ref 0.0–0.1)
Basophils Relative: 1 %
Eosinophils Absolute: 0.4 10*3/uL (ref 0.0–0.7)
Eosinophils Relative: 8 %
HEMATOCRIT: 33.5 % — AB (ref 36.0–46.0)
HEMOGLOBIN: 11 g/dL — AB (ref 12.0–15.0)
LYMPHS ABS: 1.8 10*3/uL (ref 0.7–4.0)
LYMPHS PCT: 40 %
MCH: 27.6 pg (ref 26.0–34.0)
MCHC: 32.8 g/dL (ref 30.0–36.0)
MCV: 84.2 fL (ref 78.0–100.0)
Monocytes Absolute: 0.5 10*3/uL (ref 0.1–1.0)
Monocytes Relative: 11 %
NEUTROS ABS: 1.7 10*3/uL (ref 1.7–7.7)
NEUTROS PCT: 40 %
Platelets: 286 10*3/uL (ref 150–400)
RBC: 3.98 MIL/uL (ref 3.87–5.11)
RDW: 15.7 % — ABNORMAL HIGH (ref 11.5–15.5)
WBC: 4.4 10*3/uL (ref 4.0–10.5)

## 2017-11-25 LAB — BASIC METABOLIC PANEL
ANION GAP: 5 (ref 5–15)
BUN: 7 mg/dL (ref 6–20)
CALCIUM: 9.3 mg/dL (ref 8.9–10.3)
CO2: 25 mmol/L (ref 22–32)
Chloride: 107 mmol/L (ref 101–111)
Creatinine, Ser: 0.68 mg/dL (ref 0.44–1.00)
GFR calc Af Amer: >60 mL/min (ref >60)
GFR calc non Af Amer: >60 mL/min (ref >60)
GLUCOSE: 94 mg/dL (ref 65–99)
POTASSIUM: 3.4 mmol/L — AB (ref 3.5–5.1)
Sodium: 137 mmol/L (ref 135–145)

## 2017-11-25 LAB — TROPONIN I: Troponin I: <0.03 ng/mL (ref <0.03)

## 2017-11-25 MED ORDER — ACETAMINOPHEN 325 MG PO TABS
650.0000 mg | ORAL_TABLET | Freq: Once | ORAL | Status: AC
  Administered 2017-11-25: 650 mg via ORAL
  Filled 2017-11-25: qty 2

## 2017-11-25 MED ORDER — POTASSIUM CHLORIDE CRYS ER 20 MEQ PO TBCR
40.0000 meq | EXTENDED_RELEASE_TABLET | Freq: Once | ORAL | Status: AC
  Administered 2017-11-25: 40 meq via ORAL
  Filled 2017-11-25: qty 2

## 2017-11-25 NOTE — ED Provider Notes (Signed)
[facility] Provider Note   CSN: [id number] Arrival date & time: 11/25/17  1719     History   Chief Complaint Chief Complaint  Patient presents with  . Chest Pain    HPI Kristen Swanson is a 41 y.o. female.  HPI  41 year old female presents with right-sided chest pain since waking up at 10 AM today.  In March 2018 she had a PE and has been on Eliquis since.  She has not missed any doses.  This feels completely different than when she had the PE.  When she had the PE she had back pain and severe dyspnea.  This time she is having an aching sensation in her right breast/chest that is worse whenever she lifts her arms above her head.  No shortness of breath.  No pleuritic symptoms.  Has not taken anything for the pain.  Pain is moderate.  Past Medical History:  Diagnosis Date  . ADHD (attention deficit hyperactivity disorder)   . Adjustment disorder with mixed anxiety and depressed mood   . Asthma   . Fibroid tumor   . Hypertension   . Kidney stones   . Microcytic anemia   . Pulmonary emboli [facility])     Patient Active Problem List   Diagnosis Date Noted  . Left knee injury, initial encounter 11/22/2017  . Herpes genitalis in women 09/11/2017  . Family history of colon cancer in mother 08/21/2017  . Pain in left tibia 06/12/2017  . 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 Healthcare maintenance 03/26/2017  . HTN (hypertension) 03/26/2017  . Pulmonary emboli ([place]) 03/19/2017  . Microcytic anemia   . Fibroid uterus 01/01/2016  . Menorrhagia 01/01/2016  . Obesity 02/26/2007  . Asthma 02/26/2007    Past Surgical History:  Procedure Laterality Date  . LEG SURGERY    . REPLACEMENT TOTAL KNEE Left 2008    OB History    Gravida Para Term Preterm AB Living   5 3 3   2 3    SAB TAB Ectopic Multiple Live Births   1               Home Medications    Prior to Admission medications   Medication Sig Start Date End Date Taking? Authorizing  Provider  acetaminophen (TYLENOL) 325 MG tablet Take 2 tablets (650 mg total) by mouth every 6 (six) hours as needed for moderate pain. 03/21/17   Thurnell Lose, MD  albuterol (PROVENTIL HFA;VENTOLIN HFA) 108 (90 Base) MCG/ACT inhaler Inhale 2 puffs into the lungs every 4 (four) hours as needed for wheezing or shortness of breath. 10/14/16   Delsa Grana, PA-C  amphetamine-dextroamphetamine (ADDERALL) 10 MG tablet Take 10 mg daily with breakfast by mouth.    [provider]  apixaban (ELIQUIS) 5 MG TABS tablet Take 1 tablet (5 mg total) 2 (two) times daily by mouth. 11/13/17   Ledell Noss, MD  diclofenac sodium (VOLTAREN) 1 % GEL Apply 2 g topically 4 (four) times daily. 09/11/17   Ina Homes, MD  docusate sodium (COLACE) 100 MG capsule Take 2 capsules (200 mg total) by mouth 2 (two) times daily. 03/21/17   Thurnell Lose, MD  ferrous gluconate (FERGON) 324 MG tablet Take 1 tablet (324 mg total) by mouth daily with breakfast. 08/21/17   Ledell Noss, MD  hydrochlorothiazide (HYDRODIURIL) 25 MG tablet Take 1 tablet (25 mg total) by mouth daily. 08/25/17   Ledell Noss, MD  HYDROcodone-acetaminophen (NORCO) 5-325 MG tablet Take 1  tablet every 6 (six) hours as needed by mouth. 11/17/17   Hudnall, Sharyn Lull, MD  Multiple Vitamin (MULTIVITAMIN WITH MINERALS) TABS tablet Take 1 tablet by mouth daily.    [provider]  polyethylene glycol (MIRALAX) packet Take 17 g by mouth daily as needed. 06/12/17   Shela Leff, MD  zolpidem (AMBIEN) 10 MG tablet Take 10 mg by mouth at bedtime.  11/20/15   [provider]    Family History Family History  Problem Relation Age of Onset  . Colon cancer Mother 28  . Stroke Father   . Hypertension Father   . Diabetes type I Daughter   . Autism Son   . Lung cancer Maternal Uncle     Social History Social History   Tobacco Use  . Smoking status: Never Smoker  . Smokeless tobacco: Never Used  Substance Use Topics  . Alcohol  use: No  . Drug use: No     Allergies   Patient has no known allergies.   Review of Systems Review of Systems  Constitutional: Negative for fever.  Respiratory: Negative for shortness of breath.   Cardiovascular: Positive for chest pain.  Gastrointestinal: Negative for abdominal pain, nausea and vomiting.  Musculoskeletal: Negative for back pain.  All other systems reviewed and are negative.    Physical Exam Updated Vital Signs BP (!) 148/93   Pulse 86   Resp 19   LMP 11/04/2017   SpO2 100%   Physical Exam  Constitutional: She is oriented to person, place, and time. She appears well-developed and well-nourished.  obese  HENT:  Head: Normocephalic and atraumatic.  Right Ear: External ear normal.  Left Ear: External ear normal.  Nose: Nose normal.  Eyes: Right eye exhibits no discharge. Left eye exhibits no discharge.  Cardiovascular: Normal rate, regular rhythm and normal heart sounds.  Pulses:      Radial pulses are 2+ on the right side, and 2+ on the left side.  Pulmonary/Chest: Effort normal and breath sounds normal. She exhibits tenderness.    Abdominal: Soft. There is no tenderness.  Neurological: She is alert and oriented to person, place, and time.  Skin: Skin is warm and dry.  Nursing note and vitals reviewed.    ED Treatments / Results  Labs (all labs ordered are listed, but only abnormal results are displayed) Labs Reviewed  BASIC METABOLIC PANEL - Abnormal; Notable for the following components:      Result Value   Potassium 3.4 (*)    All other components within normal limits  CBC WITH DIFFERENTIAL/PLATELET - Abnormal; Notable for the following components:   Hemoglobin 11.0 (*)    HCT 33.5 (*)    RDW 15.7 (*)    All other components within normal limits  TROPONIN I    EKG  EKG Interpretation  Date/Time:  Tuesday November 25 2017 17:22:33 EST Ventricular Rate:  90 PR Interval:  152 QRS Duration: 82 QT Interval:  372 QTC  Calculation: 455 R Axis:   22 Text Interpretation:  Normal sinus rhythm Cannot rule out Anterior infarct , age undetermined Abnormal ECG no significant change since Mar 2018 Confirmed by Sherwood Gambler [phone]) on 11/25/2017 5:29:06 PM       Radiology Dg Chest 2 View  Result Date: 11/25/2017 CLINICAL DATA:  Right chest and arm pain. EXAM: CHEST  2 VIEW COMPARISON:  03/19/2017 and 03/18/2017 FINDINGS: Both lungs are clear. Heart and mediastinum are within normal limits. Trachea is midline. No pleural effusions. No suspicious  bone findings. IMPRESSION: No active cardiopulmonary disease. Electronically Signed   By: Markus Daft M.D.   On: 11/25/2017 18:58    Procedures Procedures (including critical care time)  Medications Ordered in ED Medications  acetaminophen (TYLENOL) tablet 650 mg (650 mg Oral Given 11/25/17 1825)  potassium chloride SA (K-DUR,KLOR-CON) CR tablet 40 mEq (40 mEq Oral Given 11/25/17 1945)     Initial Impression / Assessment and Plan / ED Course  I have reviewed the triage vital signs and the nursing notes.  Pertinent labs & imaging results that were available during my care of the patient were reviewed by me and considered in my medical decision making (see chart for details).     Patient's chest pain seems muscular.  Highly doubt ACS or dissection.  It is quite reproducible and reproducible with not only touch but also movement of her arm.  It is not pleuritic, there is no tachycardia, hypoxia, or dyspnea/increased work of breathing.  While she has had a significant PE in the past, she has been compliant with her Eliquis and none of the presentation today points towards PE.  Thus I do not think workup of PE is necessary.  Treat with Tylenol which has significant help the pain as well as heat/ice.  Follow-up with PCP.  Discussed strict return precautions.  Final Clinical Impressions(s) / ED Diagnoses   Final diagnoses:  Right-sided chest wall pain    ED  Discharge Orders    None       Sherwood Gambler, MD 11/25/17 2310

## 2017-11-25 NOTE — ED Triage Notes (Signed)
C/o right side CP x today-states she has hx of PE and is taking eloquis-NAD-steady gait

## 2017-12-01 DIAGNOSIS — F902 Attention-deficit hyperactivity disorder, combined type: Secondary | ICD-10-CM | POA: Diagnosis not present

## 2017-12-04 ENCOUNTER — Ambulatory Visit: Payer: Medicare Other | Admitting: Physical Therapy

## 2017-12-11 ENCOUNTER — Other Ambulatory Visit: Payer: Self-pay

## 2017-12-11 ENCOUNTER — Ambulatory Visit: Payer: Medicare Other | Attending: Family Medicine | Admitting: Physical Therapy

## 2017-12-11 DIAGNOSIS — M25562 Pain in left knee: Secondary | ICD-10-CM | POA: Insufficient documentation

## 2017-12-11 DIAGNOSIS — M6281 Muscle weakness (generalized): Secondary | ICD-10-CM

## 2017-12-11 DIAGNOSIS — R262 Difficulty in walking, not elsewhere classified: Secondary | ICD-10-CM | POA: Diagnosis not present

## 2017-12-11 NOTE — Therapy (Signed)
[facility] [address]  [place], [place], [address] Phone: [phone]   Fax:  [phone]  Physical Therapy Evaluation  Patient Details  Name: Kristen Swanson MRN: [id number] Date of Birth: 11-24-1976 Referring Provider: Karlton Lemon, MD   Encounter Date: 12/11/2017  PT End of Session - 12/11/17 1456    Visit Number  1    Number of Visits  12    Date for PT Re-Evaluation  01/23/18    Authorization Type  Medicare & Medicaid    PT Start Time  1456 pt arrived late    PT Stop Time  1535    PT Time Calculation (min)  39 min    Activity Tolerance  Patient tolerated treatment well    Behavior During Therapy  [facility] for tasks assessed/performed       Past Medical History:  Diagnosis Date  . ADHD (attention deficit hyperactivity disorder)   . Adjustment disorder with mixed anxiety and depressed mood   . Asthma   . Fibroid tumor   . Hypertension   . Kidney stones   . Microcytic anemia   . Pulmonary emboli [facility])     Past Surgical History:  Procedure Laterality Date  . LEG SURGERY    . REPLACEMENT TOTAL KNEE Left 2008    There were no vitals filed for this visit.   Subjective Assessment - 12/11/17 1459    Subjective  Pt reports ~1 month ago she feel, landing directly on her L knee. Reports h/o chronic L knee pain s/p severe trauma from prior fall with 5 surgeries, including tibial plateau fracture & questionable fractured patella. Reports positive benefit from prior PT following knee surgery.     Pertinent History  L tibial plateau fracture 2008    Limitations  Standing;Walking;House hold activities    How long can you stand comfortably?  20-30 minutes    How long can you walk comfortably?  10 minutes    Diagnostic tests  11/09/17 - L knee xray: No acute fracture or dislocation identified about the left knee    Patient Stated Goals  "to have a more active lifestyle w/o limitation from pain"    Currently in Pain?   No/denies    Pain Score  0-No pain up to 10/10 at worst    Pain Location  Knee    Pain Orientation  Left;Anterior    Pain Descriptors / Indicators  Aching "excrutiating, sensitive"    Pain Type  Acute pain;Chronic pain    Pain Radiating Towards  down into R shin    Pain Onset  More than a month ago    Pain Frequency  Intermittent    Aggravating Factors   prolonged standing or walking, inclement weather    Pain Relieving Factors  Voltaren gel, Tylenol    Effect of Pain on Daily Activities  limited walking tolerance         [facility] PT Assessment - 12/11/17 1456      Assessment   Medical Diagnosis  Contusion of L knee    Referring Provider  Karlton Lemon, MD    Onset Date/Surgical Date  11/09/17    Next MD Visit  12/17/17    Prior Therapy  PT after tibial plateau fracture in 2008      Balance Screen   Has the patient fallen in the past 6 months  Yes    How many times?  1    Has the patient had  a decrease in activity level because of a fear of falling?   Yes    Is the patient reluctant to leave their home because of a fear of falling?   No      Home Environment   Living Environment  Private residence    Living Arrangements  Children    Type of Home  Apartment condo    Home Access  Stairs to enter    [place]  One level      Prior Function   Level of [place]  On disability    Leisure  cook, tries to walk 20 min - up to 3x/wk      Observation/Other Assessments   Focus on Therapeutic Outcomes (FOTO)   Knee - 30% (70% limitation); predicted 46% (54% limitation)      ROM / Strength   AROM / PROM / Strength  AROM;Strength      AROM   AROM Assessment Site  Knee    Right/Left Knee  Right;Left    Right Knee Extension  -2    Right Knee Flexion  137    Left Knee Extension  0    Left Knee Flexion  131      Strength   Strength Assessment Site  Hip;Knee    Right/Left Hip  Right;Left    Right Hip Flexion  4/5    Right Hip Extension  4-/5     Right Hip ABduction  4/5    Right Hip ADduction  4/5    Left Hip Flexion  4-/5    Left Hip Extension  3+/5    Left Hip ABduction  4-/5    Left Hip ADduction  4-/5    Right/Left Knee  Right;Left    Right Knee Flexion  4+/5    Right Knee Extension  4+/5    Left Knee Flexion  4-/5    Left Knee Extension  4-/5      Flexibility   Soft Tissue Assessment /Muscle Length  yes    Hamstrings  very mild tight B    Quadriceps  mod tight on L    ITB  mild tight L    Piriformis  WFL             Objective measurements completed on examination: See above findings.                PT Short Term Goals - 12/11/17 1535      PT SHORT TERM GOAL #1   Title  Independent with initial HEP    Status  New    [place] Date  12/26/17        PT Long Term Goals - 12/11/17 1535      PT LONG TERM GOAL #1   Title  Independent with ongoing HEP +/- gym program    Status  New    [place] Date  01/23/18      PT LONG TERM GOAL #2   Title  R hip and knee strength >/= 4/5 for improved stability    Status  New    [place] Date  01/23/18      PT LONG TERM GOAL #3   Title  Pt will report ability to walk for >/= 30 minutes w/o limitation due to L knee pain to allow for grocery shopping and exercise    Status  New    [place] Date  01/23/18      PT LONG TERM GOAL #  4   Title  Pt will report ability to complete normal ADLs such as bathing and light household chores w/o limitation due to L knee pain or weakness    Status  New    [place] Date  01/23/18      PT LONG TERM GOAL #5   Title  Pt will ascend/descend stairs with normal reciprocal pattern w/o limitation due to L knee pain or weakness    Status  New    [place] Date  01/23/18             Plan - December 18, 2017 1535    Clinical Impression Statement  Kristen Swanson is a 41 y/o female who presents to OP PT for L knee pain s/p fall ~1 month ago. L knee xray at time of injury negative for acute fracture or dislocation. Pt does have h/o L tibial plateau  fracture in ~2008 requiring multiple surgeries for ORIF and external fixation at the time. Pt denies pain at time of eval, but states pain up to 10/10 at times, particularly with prolonged standing or walking. L knee AROM essentially equivalent to R, but increased quad tightness present on L. Minimal to no swelling present on eval, but pt reports knee will swell up limiting her ROM. Mild to moderate hip and knee weakness noted L>R. Kristen Swanson good potential to benefit from PT with POC focusing on improving LE soft tissue pliability, core/LE strengthening and stability training, with manual therapy as indicated for ROM/pain/edema and modalities including vasopnuematic compression PRN for pain/edema.    History and Personal Factors relevant to plan of care:  L tibial plateau fracture 2008 s/p mutiple surgeries; h/o PE - currently on Eliquis; ADHD    Clinical Presentation  Stable    Clinical Decision Making  Low    Rehab Potential  Good    Clinical Impairments Affecting Rehab Potential  L tibial plateau fracture 2008 s/p mutiple surgeries; h/o PE - currently on Eliquis; ADHD    PT Frequency  2x / week    PT Duration  6 weeks    PT Treatment/Interventions  Patient/family education;ADLs/Self Care Home Management;Therapeutic exercise;Neuromuscular re-education;Balance training;Therapeutic activities;Functional mobility training;Electrical Stimulation;Cryotherapy;Vasopneumatic Device;Manual techniques;Dry needling;Taping;Iontophoresis 4mg /ml Dexamethasone    Consulted and Agree with Plan of Care  Patient       Patient will benefit from skilled therapeutic intervention in order to improve the following deficits and impairments:  Pain, Decreased strength, Increased edema, Difficulty walking, Abnormal gait, Decreased activity tolerance  Visit Diagnosis: Acute pain of left knee  Muscle weakness (generalized)  Difficulty in walking, not elsewhere classified  G-Codes - 12-18-17 1526    Functional  Assessment Tool Used (Outpatient Only)  Knee FOTO = 30% (70% limitation)    Functional Limitation  Mobility: Walking and moving around    Mobility: Walking and Moving Around Current Status (B7169)  At least 60 percent but less than 80 percent impaired, limited or restricted    Mobility: Walking and Moving Around Goal Status [phone])  At least 40 percent but less than 60 percent impaired, limited or restricted        Problem List Patient Active Problem List   Diagnosis Date Noted  . Left knee injury, initial encounter 11/22/2017  . Herpes genitalis in women 09/11/2017  . Family history of colon cancer in mother 08/21/2017  . Pain in left tibia 06/12/2017  . 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 Healthcare maintenance 03/26/2017  . HTN (hypertension) 03/26/2017  . Pulmonary emboli ([place]) 03/19/2017  . Microcytic  anemia   . Fibroid uterus 01/01/2016  . Menorrhagia 01/01/2016  . Obesity 02/26/2007  . Asthma 02/26/2007    Percival Spanish, PT, MPT 12/11/2017, 6:56 PM  [facility] [address]  [place], [place], [address] Phone: [phone]   Fax:  [phone]  Name: Kristen Swanson MRN: [id number] Date of Birth: January 27, 1976

## 2017-12-16 ENCOUNTER — Ambulatory Visit: Payer: Medicare Other

## 2017-12-17 ENCOUNTER — Ambulatory Visit (INDEPENDENT_AMBULATORY_CARE_PROVIDER_SITE_OTHER): Payer: Medicare Other | Admitting: Family Medicine

## 2017-12-17 ENCOUNTER — Ambulatory Visit: Payer: Medicare Other

## 2017-12-17 DIAGNOSIS — R262 Difficulty in walking, not elsewhere classified: Secondary | ICD-10-CM

## 2017-12-17 DIAGNOSIS — M25562 Pain in left knee: Secondary | ICD-10-CM

## 2017-12-17 DIAGNOSIS — M6281 Muscle weakness (generalized): Secondary | ICD-10-CM | POA: Diagnosis not present

## 2017-12-17 DIAGNOSIS — S8992XD Unspecified injury of left lower leg, subsequent encounter: Secondary | ICD-10-CM | POA: Diagnosis not present

## 2017-12-17 NOTE — Therapy (Signed)
[facility] [address]  [place], [place], [address] Phone: [phone]   Fax:  [phone]  Physical Therapy Treatment  Patient Details  Name: Kristen Swanson MRN: [id number] Date of Birth: [birth year] Referring Provider: Karlton Lemon, MD   Encounter Date: 12/17/2017  PT End of Session - 12/17/17 1540    Visit Number  2    Number of Visits  12    Date for PT Re-Evaluation  01/23/18    Authorization Type  Medicare & Medicaid    PT Start Time  1536    PT Stop Time  1616    PT Time Calculation (min)  40 min    Activity Tolerance  Patient tolerated treatment well    Behavior During Therapy  [facility] for tasks assessed/performed       Past Medical History:  Diagnosis Date  . ADHD (attention deficit hyperactivity disorder)   . Adjustment disorder with mixed anxiety and depressed mood   . Asthma   . Fibroid tumor   . Hypertension   . Kidney stones   . Microcytic anemia   . Pulmonary emboli [facility])     Past Surgical History:  Procedure Laterality Date  . LEG SURGERY    . REPLACEMENT TOTAL KNEE Left 2008    There were no vitals filed for this visit.  Subjective Assessment - 12/17/17 1539    Subjective  Pt. reporting, "my knee locked up on Friday when I was walking in [place]".      Pertinent History  L tibial plateau fracture 2008    Diagnostic tests  11/09/17 - L knee xray: No acute fracture or dislocation identified about the left knee    Patient Stated Goals  "to have a more active lifestyle w/o limitation from pain"    Currently in Pain?  No/denies    Pain Score  0-No pain    Multiple Pain Sites  No                      [facility] Adult PT Treatment/Exercise - 12/17/17 1546      Knee/Hip Exercises: Stretches   Quad Stretch  Left;1 rep;30 seconds    Quad Stretch Limitations  prone with strap     Gastroc Stretch  Left;2 reps;30 seconds    Gastroc Stretch Limitations  counter due to reported  tightness in calf       Knee/Hip Exercises: Aerobic   Nustep  NuStep: lvl 4, 6 min       Knee/Hip Exercises: Standing   Knee Flexion  Right;Left;10 reps    Knee Flexion Limitations  counter       Knee/Hip Exercises: Seated   Long Arc Quad  Right;Left;10 reps    Long Arc Quad Limitations  5" hold     Hamstring Curl  Both;10 reps    Hamstring Limitations  red TB in door      Knee/Hip Exercises: Supine   Bridges  10 reps    Straight Leg Raises  Left;10 reps      Knee/Hip Exercises: Sidelying   Hip ABduction  Left;10 reps    Hip ADduction  Left;10 reps    Clams  R sidleying L clam shell with red TB x 15 reps              PT Education - 12/17/17 1620    Education provided  Yes    Education Details  SLR, Sidelying hip abduction, adduction,  bridge, standing HS curl, Seated HS curl with red TB closed in door and issued to pt., LAQ     Person(s) Educated  Patient    Methods  Explanation;Demonstration;Verbal cues;Handout    Comprehension  Verbalized understanding;Returned demonstration;Verbal cues required;Need further instruction       PT Short Term Goals - 12/17/17 1540      PT SHORT TERM GOAL #1   Title  Independent with initial HEP    Status  On-going        PT Long Term Goals - 12/17/17 1540      PT LONG TERM GOAL #1   Title  Independent with ongoing HEP +/- gym program    Status  On-going      PT LONG TERM GOAL #2   Title  R hip and knee strength >/= 4/5 for improved stability    Status  On-going      PT LONG TERM GOAL #3   Title  Pt will report ability to walk for >/= 30 minutes w/o limitation due to L knee pain to allow for grocery shopping and exercise    Status  On-going      PT LONG TERM GOAL #4   Title  Pt will report ability to complete normal ADLs such as bathing and light household chores w/o limitation due to L knee pain or weakness    Status  On-going      PT LONG TERM GOAL #5   Title  Pt will ascend/descend stairs with normal reciprocal  pattern w/o limitation due to L knee pain or weakness    Status  On-going            Plan - 12/17/17 1623    Clinical Impression Statement  Kristen Swanson doing well today.  Reports knee only hurts her after prolonged walking and relieved with rest.  Treatment focused on creation of basic strengthening HEP with pt. able to perform all HEP activities well in treatment.  HEP handout issued to pt. along with red TB for home performance.  Will monitor tolerance to this in upcoming visit.       Clinical Impairments Affecting Rehab Potential  L tibial plateau fracture 2008 s/p mutiple surgeries; h/o PE - currently on Eliquis; ADHD    PT Treatment/Interventions  Patient/family education;ADLs/Self Care Home Management;Therapeutic exercise;Neuromuscular re-education;Balance training;Therapeutic activities;Functional mobility training;Electrical Stimulation;Cryotherapy;Vasopneumatic Device;Manual techniques;Dry needling;Taping;Iontophoresis 4mg /ml Dexamethasone    Consulted and Agree with Plan of Care  Patient       Patient will benefit from skilled therapeutic intervention in order to improve the following deficits and impairments:  Pain, Decreased strength, Increased edema, Difficulty walking, Abnormal gait, Decreased activity tolerance  Visit Diagnosis: Acute pain of left knee  Muscle weakness (generalized)  Difficulty in walking, not elsewhere classified     Problem List Patient Active Problem List   Diagnosis Date Noted  . Left knee injury, initial encounter 11/22/2017  . Herpes genitalis in women 09/11/2017  . Family history of colon cancer in mother 08/21/2017  . Pain in left tibia 06/12/2017  . 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 Healthcare maintenance 03/26/2017  . HTN (hypertension) 03/26/2017  . Pulmonary emboli ([place]) 03/19/2017  . Microcytic anemia   . Fibroid uterus 01/01/2016  . Menorrhagia 01/01/2016  . Obesity 02/26/2007  . Asthma 02/26/2007    Bess Harvest,  PTA 12/17/17 6:26 PM  [facility] [address]  [place], [place], [address] Phone: [phone]  Fax:  [phone]  Name: Kristen Swanson MRN: [id number] Date of Birth: 06/09/1976

## 2017-12-17 NOTE — Patient Instructions (Signed)
Continue your therapy and transition to a home exercise program after this. Icing as needed. Consider a compression sleeve when you know you're going to be on your feet a lot. Follow up with me in 6 weeks or as needed.

## 2017-12-18 ENCOUNTER — Encounter: Payer: Self-pay | Admitting: Family Medicine

## 2017-12-18 NOTE — Assessment & Plan Note (Signed)
Radiographs negative.  Doing well.  Continue therapy and home exercise program.  Icing.  Consider compression sleeve when up a lot.  Tylenol if needed.  F/u in 6 weeks or prn.

## 2017-12-18 NOTE — Progress Notes (Signed)
PCP: Ledell Noss, MD  Subjective:   HPI: Patient is a 41 y.o. female here for left knee injury.  11/19: Patient reports on 11/11 she was wearing boots, slipped on a hardwood floor and landed directly onto left knee. + swelling and bruising anteriorly. Pain level 9/10 and sharp, worse with walking. She is using topical voltaren, wearing immobilizer. She is on blood thinner due to PE. No other skin changes, numbness. No prior left knee injuries.  12/19: Patient reports she's doing better. Did physical therapy today and doing home exercises as well. Gets swelling if on feet a lot. Some difficulty with shopping - causes difficulty bending if standing, walking for prolonged periods. Has been icing, elevating. Pain level 0/10 now. No skin changes, numbness.  Past Medical History:  Diagnosis Date  . ADHD (attention deficit hyperactivity disorder)   . Adjustment disorder with mixed anxiety and depressed mood   . Asthma   . Fibroid tumor   . Hypertension   . Kidney stones   . Microcytic anemia   . Pulmonary emboli [facility])     Current Outpatient Medications on File Prior to Visit  Medication Sig Dispense Refill  . acetaminophen (TYLENOL) 325 MG tablet Take 2 tablets (650 mg total) by mouth every 6 (six) hours as needed for moderate pain. 25 tablet 0  . albuterol (PROVENTIL HFA;VENTOLIN HFA) 108 (90 Base) MCG/ACT inhaler Inhale 2 puffs into the lungs every 4 (four) hours as needed for wheezing or shortness of breath. 1 Inhaler 3  . amphetamine-dextroamphetamine (ADDERALL) 10 MG tablet Take 10 mg daily with breakfast by mouth.    Marland Kitchen apixaban (ELIQUIS) 5 MG TABS tablet Take 1 tablet (5 mg total) 2 (two) times daily by mouth. 60 tablet 4  . diclofenac sodium (VOLTAREN) 1 % GEL Apply 2 g topically 4 (four) times daily. 100 g 6  . docusate sodium (COLACE) 100 MG capsule Take 2 capsules (200 mg total) by mouth 2 (two) times daily. 10 capsule 0  . ferrous gluconate (FERGON) 324 MG tablet Take  1 tablet (324 mg total) by mouth daily with breakfast. 90 tablet 3  . hydrochlorothiazide (HYDRODIURIL) 25 MG tablet Take 1 tablet (25 mg total) by mouth daily. 90 tablet 3  . HYDROcodone-acetaminophen (NORCO) 5-325 MG tablet Take 1 tablet every 6 (six) hours as needed by mouth. 20 tablet 0  . Multiple Vitamin (MULTIVITAMIN WITH MINERALS) TABS tablet Take 1 tablet by mouth daily.    . polyethylene glycol (MIRALAX) packet Take 17 g by mouth daily as needed. 28 each 0  . zolpidem (AMBIEN) 10 MG tablet Take 10 mg by mouth at bedtime.      No current facility-administered medications on file prior to visit.     Past Surgical History:  Procedure Laterality Date  . LEG SURGERY    . REPLACEMENT TOTAL KNEE Left 2008    No Known Allergies  Social History   Socioeconomic History  . Marital status: Single    Spouse name: Not on file  . Number of children: Not on file  . Years of education: Not on file  . Highest education level: Not on file  Social Needs  . Financial resource strain: Not on file  . Food insecurity - worry: Not on file  . Food insecurity - inability: Not on file  . Transportation needs - medical: Not on file  . Transportation needs - non-medical: Not on file  Occupational History  . Not on file  Tobacco Use  .  Smoking status: Never Smoker  . Smokeless tobacco: Never Used  Substance and Sexual Activity  . Alcohol use: No  . Drug use: No  . Sexual activity: Not on file  Other Topics Concern  . Not on file  Social History Narrative  . Not on file    Family History  Problem Relation Age of Onset  . Colon cancer Mother 34  . Stroke Father   . Hypertension Father   . Diabetes type I Daughter   . Autism Son   . Lung cancer Maternal Uncle     BP 112/81   Pulse 94   Ht 5\' 9"  (1.753 m)   Wt 230 lb (104.3 kg)   BMI 33.97 kg/m   Review of Systems: See HPI above.     Objective:  Physical Exam:  Gen: NAD, comfortable in exam room.  Left knee: No gross  deformity, ecchymoses, swelling. Mild TTP anterior patella, medial joint line.  No other tenderness. ROM 0 - 120 degrees.  5/5 strength flexion and extension without pain. Negative ant/post drawers. Negative valgus/varus testing. Negative lachmanns. Negative mcmurrays, apleys, patellar apprehension. NV intact distally.   Assessment & Plan:  1. Left knee injury - Radiographs negative.  Doing well.  Continue therapy and home exercise program.  Icing.  Consider compression sleeve when up a lot.  Tylenol if needed.  F/u in 6 weeks or prn.

## 2017-12-24 ENCOUNTER — Ambulatory Visit: Payer: Medicare Other

## 2017-12-25 ENCOUNTER — Ambulatory Visit: Payer: Medicare Other

## 2017-12-25 DIAGNOSIS — M25562 Pain in left knee: Secondary | ICD-10-CM | POA: Diagnosis not present

## 2017-12-25 DIAGNOSIS — M6281 Muscle weakness (generalized): Secondary | ICD-10-CM

## 2017-12-25 DIAGNOSIS — R262 Difficulty in walking, not elsewhere classified: Secondary | ICD-10-CM

## 2017-12-25 NOTE — Therapy (Signed)
[facility] [address]  [place], [place], [address] Phone: [phone]   Fax:  [phone]  Physical Therapy Treatment  Patient Details  Name: Kristen Swanson MRN: [id number] Date of Birth: [birth year] Referring Provider: Karlton Lemon, MD   Encounter Date: 12/25/2017  PT End of Session - 12/25/17 1535    Visit Number  3    Number of Visits  12    Date for PT Re-Evaluation  01/23/18    Authorization Type  Medicare & Medicaid    PT Start Time  1025    PT Stop Time  1613    PT Time Calculation (min)  40 min    Activity Tolerance  Patient tolerated treatment well    Behavior During Therapy  [facility] for tasks assessed/performed       Past Medical History:  Diagnosis Date  . ADHD (attention deficit hyperactivity disorder)   . Adjustment disorder with mixed anxiety and depressed mood   . Asthma   . Fibroid tumor   . Hypertension   . Kidney stones   . Microcytic anemia   . Pulmonary emboli [facility])     Past Surgical History:  Procedure Laterality Date  . LEG SURGERY    . REPLACEMENT TOTAL KNEE Left 2008    There were no vitals filed for this visit.                   [facility] Adult PT Treatment/Exercise - 12/25/17 1545      Knee/Hip Exercises: Stretches   Piriformis Stretch  1 rep;30 seconds;Left due to buttocks "tightness"     Piriformis Stretch Limitations  Figure-4 with strap       Knee/Hip Exercises: Aerobic   Elliptical  Lvl 1.0, 1 min     Recumbent Bike  Lvl 2, 4 min       Knee/Hip Exercises: Machines for Strengthening   Cybex Knee Extension  B LE's; 15# x 15 reps     Cybex Knee Flexion  B LE's; 20# x 15 reps    Cybex Leg Press  B LE's 20# x 15 reps       Knee/Hip Exercises: Standing   Forward Step Up  15 reps;Left;Step Height: 4"    Forward Step Up Limitations  TKE with red       Knee/Hip Exercises: Seated   Long Arc Quad  Left;10 reps    Long Arc Quad Weight  2 lbs.    [employer] Limitations  3" hold; adduction       Knee/Hip Exercises: Supine   Bridges  15 reps;Both    Straight Leg Raises  Left;10 reps 2 sets     Straight Leg Raises Limitations  2# at ankle       Knee/Hip Exercises: Sidelying   Hip ABduction  Left;10 reps    Hip ABduction Limitations  2#    Hip ADduction  Left;10 reps    Hip ADduction Limitations  2#                PT Short Term Goals - 12/25/17 1538      PT SHORT TERM GOAL #1   Title  Independent with initial HEP    Status  Achieved        PT Long Term Goals - 12/17/17 1540      PT LONG TERM GOAL #1   Title  Independent with ongoing HEP +/- gym program  Status  On-going      PT LONG TERM GOAL #2   Title  R hip and knee strength >/= 4/5 for improved stability    Status  On-going      PT LONG TERM GOAL #3   Title  Pt will report ability to walk for >/= 30 minutes w/o limitation due to L knee pain to allow for grocery shopping and exercise    Status  On-going      PT LONG TERM GOAL #4   Title  Pt will report ability to complete normal ADLs such as bathing and light household chores w/o limitation due to L knee pain or weakness    Status  On-going      PT LONG TERM GOAL #5   Title  Pt will ascend/descend stairs with normal reciprocal pattern w/o limitation due to L knee pain or weakness    Status  On-going            Plan - 12/25/17 1618    Clinical Impression Statement  Kristen Swanson doing well today reporting no issues with HEP.  Had f/u with MD on 12.19 and progressing well per MD note with no MD f/u currently scheduled.  With good tolerance for progression of LE strengthening activities in treatment today however quickly fatigues and with visible quad weakness with TKE strengthening activities.  Will continue to progress toward goals per pt. in coming visits.      Clinical Impairments Affecting Rehab Potential  L tibial plateau fracture 2008 s/p mutiple surgeries; h/o PE - currently on Eliquis; ADHD    PT  Treatment/Interventions  Patient/family education;ADLs/Self Care Home Management;Therapeutic exercise;Neuromuscular re-education;Balance training;Therapeutic activities;Functional mobility training;Electrical Stimulation;Cryotherapy;Vasopneumatic Device;Manual techniques;Dry needling;Taping;Iontophoresis 4mg /ml Dexamethasone    Consulted and Agree with Plan of Care  Patient       Patient will benefit from skilled therapeutic intervention in order to improve the following deficits and impairments:  Pain, Decreased strength, Increased edema, Difficulty walking, Abnormal gait, Decreased activity tolerance  Visit Diagnosis: Acute pain of left knee  Muscle weakness (generalized)  Difficulty in walking, not elsewhere classified     Problem List Patient Active Problem List   Diagnosis Date Noted  . Left knee injury, subsequent encounter 11/22/2017  . Herpes genitalis in women 09/11/2017  . Family history of colon cancer in mother 08/21/2017  . Pain in left tibia 06/12/2017  . 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 Healthcare maintenance 03/26/2017  . HTN (hypertension) 03/26/2017  . Pulmonary emboli ([place]) 03/19/2017  . Microcytic anemia   . Fibroid uterus 01/01/2016  . Menorrhagia 01/01/2016  . Obesity 02/26/2007  . Asthma 02/26/2007    Bess Harvest, PTA 12/25/17 6:37 PM  [facility] [address]  [place], [place], [address] Phone: [phone]   Fax:  [phone]  Name: Kristen Swanson MRN: [id number] Date of Birth: [birth year]

## 2017-12-31 ENCOUNTER — Ambulatory Visit: Payer: Medicare Other

## 2018-01-06 ENCOUNTER — Encounter: Payer: Self-pay | Admitting: Physical Therapy

## 2018-01-06 ENCOUNTER — Ambulatory Visit: Payer: Medicare Other | Attending: Family Medicine | Admitting: Physical Therapy

## 2018-01-06 DIAGNOSIS — M6281 Muscle weakness (generalized): Secondary | ICD-10-CM | POA: Diagnosis not present

## 2018-01-06 DIAGNOSIS — R262 Difficulty in walking, not elsewhere classified: Secondary | ICD-10-CM | POA: Diagnosis not present

## 2018-01-06 DIAGNOSIS — M25562 Pain in left knee: Secondary | ICD-10-CM | POA: Insufficient documentation

## 2018-01-06 NOTE — Therapy (Addendum)
[facility] [address]  [place], [place], [address] Phone: [phone]   Fax:  [phone]  Physical Therapy Treatment  Patient Details  Name: Kristen Swanson MRN: [id number] Date of Birth: 04/16/1976 Referring Provider: Karlton Lemon, MD   Encounter Date: 01/06/2018  PT End of Session - 01/06/18 1625    Visit Number  4    Number of Visits  12    Date for PT Re-Evaluation  01/23/18    Authorization Type  Medicare & Medicaid    PT Start Time  1624 pt arrived late    PT Stop Time  1702    PT Time Calculation (min)  38 min    Activity Tolerance  Patient tolerated treatment well    Behavior During Therapy  [facility] for tasks assessed/performed       Past Medical History:  Diagnosis Date  . ADHD (attention deficit hyperactivity disorder)   . Adjustment disorder with mixed anxiety and depressed mood   . Asthma   . Fibroid tumor   . Hypertension   . Kidney stones   . Microcytic anemia   . Pulmonary emboli [facility])     Past Surgical History:  Procedure Laterality Date  . LEG SURGERY    . REPLACEMENT TOTAL KNEE Left 2008    There were no vitals filed for this visit.  Subjective Assessment - 01/06/18 1628    Subjective  Pt reporting intermittent pain and stiffness in her knee, often in relation to changes in the weather.    Pertinent History  L tibial plateau fracture 2008    Diagnostic tests  11/09/17 - L knee xray: No acute fracture or dislocation identified about the left knee    Patient Stated Goals  "to have a more active lifestyle w/o limitation from pain"    Currently in Pain?  Yes    Pain Score  3     Pain Location  Knee    Pain Orientation  Left;Anterior    Pain Descriptors / Indicators  Aching                      [facility] Adult PT Treatment/Exercise - 01/06/18 1625      Exercises   Exercises  Knee/Hip      Knee/Hip Exercises: Stretches   Gastroc Stretch  Left;30 seconds;2 reps    Gastroc Stretch Limitations  prostretch + home instructions for negative heel off edge of step &/or standing gastroc/soleus      Knee/Hip Exercises: Aerobic   Recumbent Bike  L2 x 6'      Knee/Hip Exercises: Standing   Hip Flexion  Both;10 reps;Knee straight;Stengthening    Hip Flexion Limitations  red TB, 1 pole A    Terminal Knee Extension  Left;15 reps;Theraband;Strengthening    Theraband Level (Terminal Knee Extension)  Level 4 (Blue)    Hip ADduction  Both;10 reps;Strengthening    Hip ADduction Limitations  red TB, 1 pole A    Hip Abduction  Both;10 reps;Knee straight;Stengthening    Abduction Limitations  red TB, 1 pole A    Hip Extension  Both;10 reps;Knee straight;Stengthening    Extension Limitations  red TB, 1 pole A    Functional Squat  10 reps;5 seconds    Functional Squat Limitations  counter squat             PT Education - 01/06/18 1702    Education provided  Yes  Education Details  HEP update    Person(s) Educated  Patient    Methods  Explanation;Demonstration;Handout;Verbal cues    Comprehension  Verbalized understanding;Returned demonstration;Verbal cues required;Need further instruction       PT Short Term Goals - 12/25/17 1538      PT SHORT TERM GOAL #1   Title  Independent with initial HEP    Status  Achieved        PT Long Term Goals - 12/17/17 1540      PT LONG TERM GOAL #1   Title  Independent with ongoing HEP +/- gym program    Status  On-going      PT LONG TERM GOAL #2   Title  R hip and knee strength >/= 4/5 for improved stability    Status  On-going      PT LONG TERM GOAL #3   Title  Pt will report ability to walk for >/= 30 minutes w/o limitation due to L knee pain to allow for grocery shopping and exercise    Status  On-going      PT LONG TERM GOAL #4   Title  Pt will report ability to complete normal ADLs such as bathing and light household chores w/o limitation due to L knee pain or weakness    Status  On-going      PT  LONG TERM GOAL #5   Title  Pt will ascend/descend stairs with normal reciprocal pattern w/o limitation due to L knee pain or weakness    Status  On-going            Plan - 01/06/18 1630    Clinical Impression Statement  Pt returning to PT after nearly 2 week absence, reporting good compliance with HEP but continued L knee pain and stiffness, esp with changes in the weather. Good tolerance for exercise progression other than expeceted fatigue noted with some exercise attempts today and occasional mild cramping - discussed proper diet and hydration to minimize cramping as well as stretching to relieve cramps when they occur.    Clinical Impairments Affecting Rehab Potential  L tibial plateau fracture 2008 s/p mutiple surgeries; h/o PE - currently on Eliquis; ADHD    PT Treatment/Interventions  Patient/family education;ADLs/Self Care Home Management;Therapeutic exercise;Neuromuscular re-education;Balance training;Therapeutic activities;Functional mobility training;Electrical Stimulation;Cryotherapy;Vasopneumatic Device;Manual techniques;Dry needling;Taping;Iontophoresis 82m/ml Dexamethasone    Consulted and Agree with Plan of Care  Patient       Patient will benefit from skilled therapeutic intervention in order to improve the following deficits and impairments:  Pain, Decreased strength, Increased edema, Difficulty walking, Abnormal gait, Decreased activity tolerance  Visit Diagnosis: Acute pain of left knee  Muscle weakness (generalized)  Difficulty in walking, not elsewhere classified     Problem List Patient Active Problem List   Diagnosis Date Noted  . Left knee injury, subsequent encounter 11/22/2017  . Herpes genitalis in women 09/11/2017  . Family history of colon cancer in mother 08/21/2017  . Pain in left tibia 06/12/2017  . 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 Healthcare maintenance 03/26/2017  . HTN (hypertension) 03/26/2017  . [place]  03/19/[place]  . Microcytic anemia   . Fibroid uterus 01/01/2016  . Menorrhagia 01/01/2016  . Obesity 02/26/2007  . Asthma 02/26/2007    JPercival Spanish PT, MPT 01/06/2018, 6:33 PM  [facility] [place] [address]Phone: [phone]  Fax:  [phone] Name: Kristen PULLENMRN:  [id number] Date of Birth: [birth year]   PHYSICAL THERAPY DISCHARGE SUMMARY  Visits from Start of Care: 4  Current functional level related to goals / functional outcomes:   Refer to above clinical impression for status as of last visit on 01/06/18. Pt has had 3 NS, therefore will proceed with discharge from PT for this episode per policy.   Remaining deficits:   Unable to assess due to failure to return.   Education / Equipment:   HEP  Plan: Patient agrees to discharge.  Patient goals were partially met. Patient is being discharged due to not returning since the last visit.  ?????   Percival Spanish, PT, MPT 02/09/18, 2:21 PM  [facility] [address]  [place], [place], [address] Phone: [phone]   Fax:  [phone]

## 2018-01-08 ENCOUNTER — Ambulatory Visit: Payer: Medicare Other

## 2018-01-13 ENCOUNTER — Ambulatory Visit: Payer: Medicare Other | Admitting: Physical Therapy

## 2018-01-15 ENCOUNTER — Ambulatory Visit: Payer: Medicare Other

## 2018-01-20 ENCOUNTER — Ambulatory Visit: Payer: Medicare Other

## 2018-01-22 ENCOUNTER — Ambulatory Visit: Payer: Medicare Other | Admitting: Physical Therapy

## 2018-01-22 DIAGNOSIS — F902 Attention-deficit hyperactivity disorder, combined type: Secondary | ICD-10-CM | POA: Diagnosis not present

## 2018-01-26 ENCOUNTER — Other Ambulatory Visit (HOSPITAL_COMMUNITY)
Admission: RE | Admit: 2018-01-26 | Discharge: 2018-01-26 | Disposition: A | Discharge status: Home / Self Care | Payer: Medicare Other | Source: Ambulatory Visit | Attending: Family Medicine | Admitting: Family Medicine

## 2018-01-26 ENCOUNTER — Ambulatory Visit (INDEPENDENT_AMBULATORY_CARE_PROVIDER_SITE_OTHER): Payer: Medicare Other | Admitting: Family Medicine

## 2018-01-26 ENCOUNTER — Encounter: Payer: Self-pay | Admitting: Family Medicine

## 2018-01-26 DIAGNOSIS — B9689 Other specified bacterial agents as the cause of diseases classified elsewhere: Secondary | ICD-10-CM | POA: Diagnosis not present

## 2018-01-26 DIAGNOSIS — N898 Other specified noninflammatory disorders of vagina: Secondary | ICD-10-CM | POA: Diagnosis not present

## 2018-01-26 DIAGNOSIS — N760 Acute vaginitis: Secondary | ICD-10-CM | POA: Diagnosis not present

## 2018-01-26 MED ORDER — FLUCONAZOLE 150 MG PO TABS: 150.0000 mg | ORAL_TABLET | Freq: Every day | ORAL | 0 refills | Status: DC

## 2018-01-26 MED ORDER — TERCONAZOLE 0.8 % VA CREA: 1.0000 | TOPICAL_CREAM | Freq: Every day | VAGINAL | 0 refills | Status: AC

## 2018-01-26 MED ORDER — VALACYCLOVIR HCL 500 MG PO TABS: 500.0000 mg | ORAL_TABLET | Freq: Two times a day (BID) | ORAL | 5 refills | Status: AC

## 2018-01-26 NOTE — Progress Notes (Deleted)
Gynecology Annual Exam  PCP: Ledell Noss, MD  Chief Complaint:  Chief Complaint  Patient presents with  . Vaginal Discharge  . Vaginal Itching    History of Present Illness: Patient is a 42 y.o. [id number] presents for annual exam. The patient has no complaints today.   LMP: Patient's last menstrual period was 12/30/2017. Menarche:{numbers 5-36:14431} Average Interval: {Desc; regular/irreg:14544}, {numbers 22-35:14824} days Duration of flow: {numbers; 0-10:33138} days Heavy Menses: {yes/no:63} Clots: {yes/no:63} Intermenstrual Bleeding: {yes/no:63} Postcoital Bleeding: {yes/no:63} Dysmenorrhea: {yes/no:63}   The patient {sys sexually active:13135} sexually active. She currently uses {infertility contracep types:14820} for contraception. She {has/denies:315300} dyspareunia.  The patient {DOES_DOES VQM:08676} perform self breast exams.  There {is/is no:19420} notable family history of breast or ovarian cancer in her family.  The patient {Reports/denies:60888} current symptoms of depression.    Review of Systems: ROS  Past Medical History:  Past Medical History:  Diagnosis Date  . ADHD (attention deficit hyperactivity disorder)   . Adjustment disorder with mixed anxiety and depressed mood   . Asthma   . Fibroid tumor   . Hypertension   . Kidney stones   . Microcytic anemia   . Pulmonary emboli [facility])     Past Surgical History:  Past Surgical History:  Procedure Laterality Date  . LEG SURGERY    . REPLACEMENT TOTAL KNEE Left 2008    Gynecologic History:  Patient's last menstrual period was 12/30/2017. Contraception: {method:5051} Last Pap: Results were: {Findings; lab pap smear results:16707::"NIL and HR HPV+","NIL and HR HPV negative"}   Obstetric History: [id number]  Family History:  Family History  Problem Relation Age of Onset  . Colon cancer Mother 50  . Stroke Father   . Hypertension Father   . Diabetes type I Daughter   . Autism Son   . Lung cancer Maternal  Uncle     Social History:  Social History   Socioeconomic History  . Marital status: Single    Spouse name: Not on file  . Number of children: Not on file  . Years of education: Not on file  . Highest education level: Not on file  Social Needs  . Financial resource strain: Not on file  . Food insecurity - worry: Not on file  . Food insecurity - inability: Not on file  . Transportation needs - medical: Not on file  . Transportation needs - non-medical: Not on file  Occupational History  . Not on file  Tobacco Use  . Smoking status: Never Smoker  . Smokeless tobacco: Never Used  Substance and Sexual Activity  . Alcohol use: No  . Drug use: No  . Sexual activity: Not on file  Other Topics Concern  . Not on file  Social History Narrative  . Not on file    Allergies:  No Known Allergies  Medications: Prior to Admission medications   Medication Sig Start Date End Date Taking? Authorizing Provider  acetaminophen (TYLENOL) 325 MG tablet Take 2 tablets (650 mg total) by mouth every 6 (six) hours as needed for moderate pain. 03/21/17  Yes Thurnell Lose, MD  albuterol (PROVENTIL HFA;VENTOLIN HFA) 108 (90 Base) MCG/ACT inhaler Inhale 2 puffs into the lungs every 4 (four) hours as needed for wheezing or shortness of breath. 10/14/16  Yes Delsa Grana, PA-C  amphetamine-dextroamphetamine (ADDERALL) 10 MG tablet Take 10 mg daily with breakfast by mouth.   Yes [provider]  apixaban (ELIQUIS) 5 MG TABS tablet Take 1 tablet (5 mg total) 2 (two) times daily by  mouth. 11/13/17  Yes Ledell Noss, MD  diclofenac sodium (VOLTAREN) 1 % GEL Apply 2 g topically 4 (four) times daily. 09/11/17  Yes Helberg, Larkin Ina, MD  docusate sodium (COLACE) 100 MG capsule Take 2 capsules (200 mg total) by mouth 2 (two) times daily. 03/21/17  Yes Thurnell Lose, MD  ferrous gluconate (FERGON) 324 MG tablet Take 1 tablet (324 mg total) by mouth daily with breakfast. 08/21/17  Yes Ledell Noss, MD   hydrochlorothiazide (HYDRODIURIL) 25 MG tablet Take 1 tablet (25 mg total) by mouth daily. 08/25/17  Yes Ledell Noss, MD  HYDROcodone-acetaminophen [facility]) 5-325 MG tablet Take 1 tablet every 6 (six) hours as needed by mouth. 11/17/17  Yes Hudnall, Sharyn Lull, MD  Multiple Vitamin (MULTIVITAMIN WITH MINERALS) TABS tablet Take 1 tablet by mouth daily.   Yes [provider]  zolpidem (AMBIEN) 10 MG tablet Take 10 mg by mouth at bedtime.  11/20/15  Yes [provider]  fluconazole (DIFLUCAN) 150 MG tablet Take 1 tablet (150 mg total) by mouth daily. 01/26/18   Akiko Schexnider, DO  polyethylene glycol [facility]) packet Take 17 g by mouth daily as needed. Patient not taking: Reported on 01/26/2018 06/12/17   Shela Leff, MD  terconazole (TERAZOL 3) 0.8 % vaginal cream Place 1 applicator vaginally at bedtime for 7 days. 01/26/18 02/02/18  Dannielle Huh, DO    Physical Exam Vitals: Blood pressure 134/84, pulse 92, last menstrual period 12/30/2017.  General: NAD HEENT: normocephalic, anicteric Thyroid: no enlargement, no palpable nodules Pulmonary: No increased work of breathing, CTAB Cardiovascular: RRR, distal pulses 2+ Breast: Breast symmetrical, no tenderness, no palpable nodules or masses, no skin or nipple retraction present, no nipple discharge.  No axillary or supraclavicular lymphadenopathy. Abdomen: NABS, soft, non-tender, non-distended.  Umbilicus without lesions.  No hepatomegaly, splenomegaly or masses palpable. No evidence of hernia  Genitourinary:  External: Normal external female genitalia.  Normal urethral meatus, normal  Bartholin's and Skene's glands.    Vagina: Normal vaginal mucosa, no evidence of prolapse.    Cervix: Grossly normal in appearance, no bleeding  Uterus: Non-enlarged, mobile, normal contour.  No CMT  Adnexa: ovaries non-enlarged, no adnexal masses  Rectal: deferred  Lymphatic: no evidence of inguinal lymphadenopathy Extremities: no edema, erythema, or  tenderness Neurologic: Grossly intact Psychiatric: mood appropriate, affect full  Female chaperone present for pelvic and breast  portions of the physical exam    Assessment: 42 y.o. [id number] No problem-specific Assessment & Plan notes found for this encounter.   Plan:  There are no diagnoses linked to this encounter.

## 2018-01-26 NOTE — Progress Notes (Signed)
Having some vaginal itching and d/c. Used monistat 3 and it went away but then came back. Has had BV in past and was treated.

## 2018-01-26 NOTE — Patient Instructions (Signed)

## 2018-01-26 NOTE — Progress Notes (Signed)
Gynecology Annual Exam  PCP: Ledell Noss, MD  Chief Complaint:  Chief Complaint  Patient presents with  . Vaginal Discharge  . Vaginal Itching    History of Present Illness: Patient is a 42 y.o. [name] presents for vaginal itching. Patient says itching is worse after working out at the gym. Tried OTC yeast treatment and this helped some. Feels like yeast infection from previous time.     Review of Systems: Review of Systems  Constitutional: Negative for chills and fever.  HENT: Negative for hearing loss and tinnitus.   Eyes: Negative for blurred vision and double vision.  Cardiovascular: Negative for chest pain and palpitations.  Gastrointestinal: Negative for heartburn and nausea.  Genitourinary: Negative for dysuria.  Musculoskeletal: Negative for myalgias and neck pain.  Skin: Positive for itching. Negative for rash.    Past Medical History:  Past Medical History:  Diagnosis Date  . ADHD (attention deficit hyperactivity disorder)   . Adjustment disorder with mixed anxiety and depressed mood   . Asthma   . Fibroid tumor   . Hypertension   . Kidney stones   . Microcytic anemia   . Pulmonary emboli [facility])     Past Surgical History:  Past Surgical History:  Procedure Laterality Date  . LEG SURGERY    . REPLACEMENT TOTAL KNEE Left 2008     Obstetric History: [id number]  Family History:  Family History  Problem Relation Age of Onset  . Colon cancer Mother 15  . Stroke Father   . Hypertension Father   . Diabetes type I Daughter   . Autism Son   . Lung cancer Maternal Uncle     Social History:  Social History   Socioeconomic History  . Marital status: Single    Spouse name: Not on file  . Number of children: Not on file  . Years of education: Not on file  . Highest education level: Not on file  Social Needs  . Financial resource strain: Not on file  . Food insecurity - worry: Not on file  . Food insecurity - inability: Not on file  . Transportation needs  - medical: Not on file  . Transportation needs - non-medical: Not on file  Occupational History  . Not on file  Tobacco Use  . Smoking status: Never Smoker  . Smokeless tobacco: Never Used  Substance and Sexual Activity  . Alcohol use: No  . Drug use: No  . Sexual activity: Not on file  Other Topics Concern  . Not on file  Social History Narrative  . Not on file    Allergies:  No Known Allergies  Medications: Prior to Admission medications   Medication Sig Start Date End Date Taking? Authorizing Provider  acetaminophen (TYLENOL) 325 MG tablet Take 2 tablets (650 mg total) by mouth every 6 (six) hours as needed for moderate pain. 03/21/17  Yes Thurnell Lose, MD  albuterol (PROVENTIL HFA;VENTOLIN HFA) 108 (90 Base) MCG/ACT inhaler Inhale 2 puffs into the lungs every 4 (four) hours as needed for wheezing or shortness of breath. 10/14/16  Yes Delsa Grana, PA-C  amphetamine-dextroamphetamine (ADDERALL) 10 MG tablet Take 10 mg daily with breakfast by mouth.   Yes [provider]  apixaban (ELIQUIS) 5 MG TABS tablet Take 1 tablet (5 mg total) 2 (two) times daily by mouth. 11/13/17  Yes Ledell Noss, MD  diclofenac sodium (VOLTAREN) 1 % GEL Apply 2 g topically 4 (four) times daily. 09/11/17  Yes Ina Homes, MD  docusate sodium (  COLACE) 100 MG capsule Take 2 capsules (200 mg total) by mouth 2 (two) times daily. 03/21/17  Yes Thurnell Lose, MD  ferrous gluconate (FERGON) 324 MG tablet Take 1 tablet (324 mg total) by mouth daily with breakfast. 08/21/17  Yes Ledell Noss, MD  hydrochlorothiazide (HYDRODIURIL) 25 MG tablet Take 1 tablet (25 mg total) by mouth daily. 08/25/17  Yes Ledell Noss, MD  HYDROcodone-acetaminophen [facility]) 5-325 MG tablet Take 1 tablet every 6 (six) hours as needed by mouth. 11/17/17  Yes Hudnall, Sharyn Lull, MD  Multiple Vitamin (MULTIVITAMIN WITH MINERALS) TABS tablet Take 1 tablet by mouth daily.   Yes [provider]  zolpidem (AMBIEN) 10 MG tablet  Take 10 mg by mouth at bedtime.  11/20/15  Yes [provider]  fluconazole (DIFLUCAN) 150 MG tablet Take 1 tablet (150 mg total) by mouth daily. 01/26/18   Kimberlin Scheel, DO  polyethylene glycol [facility]) packet Take 17 g by mouth daily as needed. Patient not taking: Reported on 01/26/2018 06/12/17   Shela Leff, MD  terconazole (TERAZOL 3) 0.8 % vaginal cream Place 1 applicator vaginally at bedtime for 7 days. 01/26/18 02/02/18  Dannielle Huh, DO    Physical Exam Vitals: Blood pressure 134/84, pulse 92, last menstrual period 12/30/2017.  General: NAD HEENT: normocephalic, anicteric Thyroid: no enlargement, no palpable nodules Pulmonary: No increased work of breathing, CTAB Cardiovascular: RRR, distal pulses 2+ Abdomen: NABS, soft, non-tender, non-distended.  Umbilicus without lesions.  No hepatomegaly, splenomegaly or masses palpable. No evidence of hernia   Lymphatic: no evidence of inguinal lymphadenopathy Extremities: no edema, erythema, or tenderness Neurologic: Grossly intact Psychiatric: mood appropriate, affect full    Assessment: 42 y.o. Kristen Swanson here for vaginal itching  Plan: Treat presumptively for yeast infection. Wet prep pending.

## 2018-01-28 LAB — CERVICOVAGINAL ANCILLARY ONLY
Bacterial vaginitis: POSITIVE — AB
Chlamydia: NEGATIVE
NEISSERIA GONORRHEA: NEGATIVE
Trichomonas: NEGATIVE

## 2018-01-29 ENCOUNTER — Encounter: Payer: Self-pay | Admitting: Family Medicine

## 2018-01-29 MED ORDER — METRONIDAZOLE 500 MG PO TABS: 500.0000 mg | ORAL_TABLET | Freq: Two times a day (BID) | ORAL | 0 refills | Status: AC

## 2018-02-25 ENCOUNTER — Ambulatory Visit: Payer: Self-pay

## 2018-04-14 ENCOUNTER — Telehealth: Payer: Self-pay

## 2018-04-14 NOTE — Telephone Encounter (Signed)
I called [facility] for refill history of Eliquis. It was last picked up on 01/09/18 for a 30-day supply. She still has 3 refills left. Attempted to call patient to ask about the medication but phone number provided in her chart is out of service.

## 2018-04-16 ENCOUNTER — Other Ambulatory Visit: Payer: Self-pay | Admitting: Pharmacist

## 2018-04-16 MED ORDER — APIXABAN 5 MG PO TABS: 5.0000 mg | ORAL_TABLET | Freq: Two times a day (BID) | ORAL | 4 refills | Status: DC

## 2018-04-21 ENCOUNTER — Encounter: Payer: Self-pay | Admitting: Internal Medicine

## 2018-04-23 DIAGNOSIS — F902 Attention-deficit hyperactivity disorder, combined type: Secondary | ICD-10-CM | POA: Diagnosis not present

## 2018-07-15 DIAGNOSIS — F902 Attention-deficit hyperactivity disorder, combined type: Secondary | ICD-10-CM | POA: Diagnosis not present

## 2018-07-15 DIAGNOSIS — F411 Generalized anxiety disorder: Secondary | ICD-10-CM | POA: Diagnosis not present

## 2018-10-12 DIAGNOSIS — R05 Cough: Secondary | ICD-10-CM | POA: Diagnosis not present

## 2018-10-12 DIAGNOSIS — J4520 Mild intermittent asthma, uncomplicated: Secondary | ICD-10-CM | POA: Diagnosis not present

## 2018-11-09 ENCOUNTER — Encounter: Payer: Self-pay | Admitting: Family Medicine

## 2018-11-09 ENCOUNTER — Ambulatory Visit (INDEPENDENT_AMBULATORY_CARE_PROVIDER_SITE_OTHER): Payer: Medicare Other | Admitting: Family Medicine

## 2018-11-09 VITALS — BP 141/84 | HR 84 | Wt 240.0 lb

## 2018-11-09 DIAGNOSIS — D219 Benign neoplasm of connective and other soft tissue, unspecified: Secondary | ICD-10-CM | POA: Diagnosis not present

## 2018-11-09 DIAGNOSIS — N946 Dysmenorrhea, unspecified: Secondary | ICD-10-CM

## 2018-11-09 DIAGNOSIS — N939 Abnormal uterine and vaginal bleeding, unspecified: Secondary | ICD-10-CM

## 2018-11-09 MED ORDER — TRAMADOL HCL 50 MG PO TABS: 50.0000 mg | ORAL_TABLET | Freq: Four times a day (QID) | ORAL | 0 refills | Status: DC | PRN

## 2018-11-09 NOTE — Progress Notes (Signed)
Having irregular periods for several months. When has period has a lot of bad cramping and heavy bleeding. On blood thinners for hx PE so cannot take ibuprofen.

## 2018-11-09 NOTE — Progress Notes (Signed)
   GYNECOLOGY OFFICE VISIT NOTE History:  42 y.o. [id number] here today for abnormal uterine bleeding. She denies any abnormal vaginal discharge, bleeding, pelvic pain or other concerns.   - periods are heavy, longer lasting also with worsening cramps  - has fibroids and was going to have removed but then PE - didn't want removal, wanted partial H/S instead - had PE and thinks will be on blood thinners for a long time, unknown cause - got a mammogram done - painful lumps in breast, removed lumps because of bleeding  - told not to take ibuprofen because of being on blood thinner  - gets IV iron every 3 months  - has Tylenol with codeine for history of knee surgery - only a couple pills left but thinks expired  - Tylenol doesn't help, tried Tramadol before which helped  - still wants H/S - saw Dr. Rip Harbour about this in the past   The following portions of the patient's history were reviewed and updated as appropriate: allergies, current medications, past family history, past medical history, past social history, past surgical history and problem list.    Review of Systems:  Pertinent items noted in HPI ROS  Objective:  Physical Exam BP (!) 141/84   Pulse 84   Wt 240 lb (108.9 kg)   LMP 11/08/2018   BMI 35.44 kg/m  Physical Exam  Constitutional: She is oriented to person, place, and time. She appears well-developed and well-nourished. No distress.  HENT:  Head: Normocephalic and atraumatic.  Cardiovascular: Normal rate.  Pulmonary/Chest: No respiratory distress.  Genitourinary:  Genitourinary Comments: deferred  Neurological: She is alert and oriented to person, place, and time.  Psychiatric: She has a normal mood and affect. Her behavior is normal.  Nursing note and vitals reviewed.  Assessment & Plan:   [id number] with PMH of PE on Eliquis, HTN who presents for follow-up of long term history of abnormal uterine bleeding in setting of known fibroids. Was supposed to have partial  H/S but then had PE, hoping to be able to have H/S now. Will schedule for pre-op and discussion with Dr. Rip Harbour who had planned on performing case previously. Limited refill of tramadol provided given advised not to take NSAIDs because of bleeding risk with Eliquis.   Lambert Mody. Juleen China, DO OB Family Medicine Fellow, [facility] for [employer], [place]

## 2018-11-13 ENCOUNTER — Emergency Department (HOSPITAL_COMMUNITY): Payer: Medicare Other

## 2018-11-13 ENCOUNTER — Emergency Department (HOSPITAL_COMMUNITY)
Admission: EM | Admit: 2018-11-13 | Discharge: 2018-11-13 | Disposition: A | Discharge status: Home / Self Care | Payer: Medicare Other | Attending: Emergency Medicine | Admitting: Emergency Medicine

## 2018-11-13 DIAGNOSIS — D500 Iron deficiency anemia secondary to blood loss (chronic): Secondary | ICD-10-CM

## 2018-11-13 DIAGNOSIS — R400 Somnolence: Secondary | ICD-10-CM | POA: Diagnosis not present

## 2018-11-13 DIAGNOSIS — R0789 Other chest pain: Secondary | ICD-10-CM

## 2018-11-13 DIAGNOSIS — Z96652 Presence of left artificial knee joint: Secondary | ICD-10-CM | POA: Diagnosis not present

## 2018-11-13 DIAGNOSIS — R079 Chest pain, unspecified: Secondary | ICD-10-CM | POA: Diagnosis not present

## 2018-11-13 DIAGNOSIS — J45909 Unspecified asthma, uncomplicated: Secondary | ICD-10-CM | POA: Diagnosis not present

## 2018-11-13 DIAGNOSIS — I10 Essential (primary) hypertension: Secondary | ICD-10-CM | POA: Diagnosis not present

## 2018-11-13 DIAGNOSIS — Z79899 Other long term (current) drug therapy: Secondary | ICD-10-CM | POA: Insufficient documentation

## 2018-11-13 DIAGNOSIS — Z7901 Long term (current) use of anticoagulants: Secondary | ICD-10-CM | POA: Insufficient documentation

## 2018-11-13 DIAGNOSIS — R110 Nausea: Secondary | ICD-10-CM | POA: Diagnosis not present

## 2018-11-13 DIAGNOSIS — R1111 Vomiting without nausea: Secondary | ICD-10-CM | POA: Diagnosis not present

## 2018-11-13 LAB — CBC
HEMATOCRIT: 29.4 % — AB (ref 36.0–46.0)
HEMOGLOBIN: 8.2 g/dL — AB (ref 12.0–15.0)
MCH: 19.7 pg — AB (ref 26.0–34.0)
MCHC: 27.9 g/dL — AB (ref 30.0–36.0)
MCV: 70.5 fL — AB (ref 80.0–100.0)
Platelets: 395 10*3/uL (ref 150–400)
RBC: 4.17 MIL/uL (ref 3.87–5.11)
RDW: 18.6 % — ABNORMAL HIGH (ref 11.5–15.5)
WBC: 7.6 10*3/uL (ref 4.0–10.5)
nRBC: 0 % (ref 0.0–0.2)

## 2018-11-13 LAB — I-STAT BETA HCG BLOOD, ED (MC, WL, AP ONLY): I-stat hCG, quantitative: <5 m[IU]/mL (ref <5)

## 2018-11-13 LAB — BASIC METABOLIC PANEL
ANION GAP: 8 (ref 5–15)
BUN: 9 mg/dL (ref 6–20)
CHLORIDE: 102 mmol/L (ref 98–111)
CO2: 25 mmol/L (ref 22–32)
Calcium: 9 mg/dL (ref 8.9–10.3)
Creatinine, Ser: 0.75 mg/dL (ref 0.44–1.00)
GFR calc Af Amer: >60 mL/min (ref >60)
GFR calc non Af Amer: >60 mL/min (ref >60)
Glucose, Bld: 105 mg/dL — ABNORMAL HIGH (ref 70–99)
POTASSIUM: 3.2 mmol/L — AB (ref 3.5–5.1)
Sodium: 135 mmol/L (ref 135–145)

## 2018-11-13 LAB — I-STAT TROPONIN, ED: TROPONIN I, POC: 0 ng/mL (ref 0.00–0.08)

## 2018-11-13 NOTE — ED Notes (Signed)
Patient transported to X-ray 

## 2018-11-13 NOTE — ED Notes (Signed)
Patient verbalizes understanding of medications and discharge instructions. No further questions at this time. VSS and patient ambulatory at discharge.   

## 2018-11-13 NOTE — ED Triage Notes (Signed)
Sudden onset of sharp left sided chest pain, w/ movement.  No SOB, reports N/V, given 4mg  Zofran.  Took 324 ASA and trematol prior to EMS arrival.

## 2018-11-13 NOTE — ED Provider Notes (Signed)
[place] EMERGENCY DEPARTMENT Provider Note  CSN: [id number] Arrival date & time: 11/13/18 0216  Chief Complaint(s) Chest Pain  HPI Kristen Swanson is a 42 y.o. 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  Pain came on slowly approximately 2 hours prior to arrival and gradually worsened over a short period of time.  Pain is exacerbated with point tenderness of the lateral chest wall and movement.  States that this is completely different than her pulmonary embolism pain.  She denies any other alleviating or aggravating factors.  No associated shortness of breath, substernal chest pain, nausea.  She did report having an episode of emesis after taking tramadol on an empty stomach which caused her to cough one time.  Tramadol was taken after pain onset.  Besides that no other emesis or coughing.  No recent fevers or infections.  Patient denies any falls or trauma.  No abdominal pain.  No back pain.  Patient reports being compliant with her Eliquis.  States that she is currently on her menstrual cycle which is heavy, but typical.   HPI  Past Medical History Past Medical History:  Diagnosis Date  . ADHD (attention deficit hyperactivity disorder)   . Adjustment disorder with mixed anxiety and depressed mood   . Asthma   . Fibroid tumor   . Hypertension   . Kidney stones   . Microcytic anemia   . Pulmonary emboli [facility])    Patient Active Problem List   Diagnosis Date Noted  . Vaginal itching 01/26/2018  . Left knee injury, subsequent encounter 11/22/2017  . Herpes genitalis in women 09/11/2017  . Family history of colon cancer in mother 08/21/2017  . Pain in left tibia 06/12/2017  . 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 Healthcare maintenance 03/26/2017  . HTN (hypertension)  03/26/2017  . Pulmonary emboli ([place]) 03/19/2017  . Microcytic anemia   . Fibroid uterus 01/01/2016  . Menorrhagia 01/01/2016  . Obesity 02/26/2007  . Asthma 02/26/2007   Home Medication(s) Prior to Admission medications   Medication Sig Start Date End Date Taking? Authorizing Provider  amphetamine-dextroamphetamine (ADDERALL XR) 15 MG 24 hr capsule Take 15 mg by mouth daily.    Yes [provider]  apixaban (ELIQUIS) 5 MG TABS tablet Take 1 tablet (5 mg total) by mouth 2 (two) times daily. 04/16/18  Yes Ledell Noss, MD  hydrochlorothiazide (HYDRODIURIL) 25 MG tablet Take 1 tablet (25 mg total) by mouth daily. 08/25/17  Yes Ledell Noss, MD  traMADol (ULTRAM) 50 MG tablet Take 1 tablet (50 mg total) by mouth every 6 (six) hours as needed (painful periods). 11/09/18  Yes Glenice Bow, DO  acetaminophen (TYLENOL) 325 MG tablet Take 2 tablets (650 mg total) by mouth every 6 (six) hours as needed for moderate pain. Patient not taking: Reported on 11/13/2018 03/21/17   Thurnell Lose, MD  albuterol (PROVENTIL HFA;VENTOLIN HFA) 108 (90 Base) MCG/ACT inhaler Inhale 2 puffs into the lungs every 4 (four) hours as needed for wheezing or shortness of breath. Patient not taking: Reported on 11/13/2018 10/14/16   Delsa Grana, PA-C  diclofenac sodium (VOLTAREN) 1 % GEL Apply 2 g topically 4 (four) times daily. Patient not taking: Reported on 11/13/2018 09/11/17   Ina Homes, MD  docusate sodium (COLACE) 100 MG capsule Take 2 capsules (200 mg total) by mouth  2 (two) times daily. Patient not taking: Reported on 11/13/2018 03/21/17   Thurnell Lose, MD  ferrous gluconate (FERGON) 324 MG tablet Take 1 tablet (324 mg total) by mouth daily with breakfast. Patient not taking: Reported on 11/13/2018 08/21/17   Ledell Noss, MD                                                                                                                                    Past Surgical History Past Surgical  History:  Procedure Laterality Date  . LEG SURGERY    . REPLACEMENT TOTAL KNEE Left 2008   Family History Family History  Problem Relation Age of Onset  . Colon cancer Mother 15  . Stroke Father   . Hypertension Father   . Diabetes type I Daughter   . Autism Son   . Lung cancer Maternal Uncle     Social History Social History   Tobacco Use  . Smoking status: Never Smoker  . Smokeless tobacco: Never Used  Substance Use Topics  . Alcohol use: No  . Drug use: No   Allergies Patient has no known allergies.  Review of Systems Review of Systems All other systems are reviewed and are negative for acute change except as noted in the HPI  Physical Exam Vital Signs  I have reviewed the triage vital signs BP 121/73   Pulse 72   Resp 13   Ht 5\' 9"  (1.753 m)   Wt 106.1 kg   LMP 11/08/2018   SpO2 100%   BMI 34.56 kg/m   Physical Exam  Constitutional: She is oriented to person, place, and time. She appears well-developed and well-nourished. No distress.  HENT:  Head: Normocephalic and atraumatic.  Nose: Nose normal.  Eyes: Pupils are equal, round, and reactive to light. Conjunctivae and EOM are normal. Right eye exhibits no discharge. Left eye exhibits no discharge. No scleral icterus.  Neck: Normal range of motion. Neck supple.  Cardiovascular: Normal rate and regular rhythm. Exam reveals no gallop and no friction rub.  No murmur heard. Pulmonary/Chest: Effort normal and breath sounds normal. No stridor. No respiratory distress. She has no rales. She exhibits tenderness (Point tenderness).    Abdominal: Soft. She exhibits no distension. There is no tenderness.  Musculoskeletal: She exhibits no edema or tenderness.  Neurological: She is alert and oriented to person, place, and time.  Skin: Skin is warm and dry. No rash noted. She is not diaphoretic. No erythema.  Psychiatric: She has a normal mood and affect.  Vitals reviewed.   ED Results and Treatments Labs (all  labs ordered are listed, but only abnormal results are displayed) Labs Reviewed  BASIC METABOLIC PANEL - Abnormal; Notable for the following components:      Result Value   Potassium 3.2 (*)    Glucose, Bld 105 (*)    All other components within normal limits  CBC - Abnormal; Notable  for the following components:   Hemoglobin 8.2 (*)    HCT 29.4 (*)    MCV 70.5 (*)    MCH 19.7 (*)    MCHC 27.9 (*)    RDW 18.6 (*)    All other components within normal limits  I-STAT TROPONIN, ED  I-STAT BETA HCG BLOOD, ED ([facility], [facility], [facility] ONLY)                                                                                                                         EKG  EKG Interpretation  Date/Time:  Friday November 13 2018 02:23:13 EST Ventricular Rate:  77 PR Interval:    QRS Duration: 98 QT Interval:  414 QTC Calculation: 469 R Axis:   27 Text Interpretation:  Sinus rhythm Borderline T wave abnormalities Baseline wander in lead(s) II III No significant change since last tracing Confirmed by Addison Lank [phone]) on 11/13/2018 3:01:19 AM      Radiology Dg Chest 2 View  Result Date: 11/13/2018 CLINICAL DATA:  Left-sided chest pain with movement. EXAM: CHEST - 2 VIEW COMPARISON:  None. FINDINGS: The heart size and mediastinal contours are within normal limits. Both lungs are clear. The visualized skeletal structures are unremarkable. IMPRESSION: No active cardiopulmonary disease. Electronically Signed   By: Dorise Bullion III M.D   On: 11/13/2018 02:41   Pertinent labs & imaging results that were available during my care of the patient were reviewed by me and considered in my medical decision making (see chart for details).  Medications Ordered in ED Medications - No data to display                                                                                                                                  Procedures Procedures  (including critical care time)  Medical Decision Making / ED  Course I have reviewed the nursing notes for this encounter and the patient's prior records (if available in EHR or on provided paperwork).    Left lateral chest wall pain with point tenderness, more consistent with MSK etiology.  Highly inconsistent with ACS.  EKG without acute ischemic changes or evidence of pericarditis.  Initial troponin negative.  Doubt cardiac etiology such as ACS.  Do not feel that additional cardiac markers are warranted at this time.  The patient has history of PEs, she is compliant with her Eliquis and pain is "very  different" than her prior PE pain.  Additionally in the pain came on gradually and there is no associated shortness of breath.  Patient is not tachycardic or hypoxic.  Chest x-ray without evidence suggestive of pneumonia, pneumothorax, pneumomediastinum.  No abnormal contour of the mediastinum to suggest dissection. No evidence of acute injuries.  Not classic for aortic dissection or esophageal perforation.  MDM was discussed with patient and she agreed that this was unlikely pulmonary embolism or cardiac etiology.  We discussed obtaining a CAT scan which patient declined with shared decision making.  On the work-up we did reveal anemia with a hemoglobin of 8.2 which is a 3 g drop from 11 months ago.  Patient denied any melenic stools or hematochezia.  Patient is on iron supplementation and does endorse constipation and dark stools.  I believe her hemoglobin drop is likely secondary to anticoagulation and heavy menstrual cycles.  I recommend that she follow-up closely with a primary care provider early next week for repeat hemoglobin check and likely iron infusion.  The patient appears reasonably screened and/or stabilized for discharge and I doubt any other medical condition or other [facility] requiring further screening, evaluation, or treatment in the ED at this time prior to discharge.  The patient is safe for discharge with strict return precautions.   Final  Clinical Impression(s) / ED Diagnoses Final diagnoses:  Chest wall pain  Iron deficiency anemia due to chronic blood loss    Disposition: Discharge  Condition: Good  I have discussed the results, Dx and Tx plan with the patient who expressed understanding and agree(s) with the plan. Discharge instructions discussed at great length. The patient was given strict return precautions who verbalized understanding of the instructions. No further questions at time of discharge.    ED Discharge Orders    None       Follow Up: Primary care provider  Schedule an appointment as soon as possible for a visit  in 3-5 days for repeat hemoglobin check     This chart was dictated using voice recognition software.  Despite best efforts to proofread,  errors can occur which can change the documentation meaning.   Fatima Blank, MD 11/13/18 [phone]

## 2018-12-07 DIAGNOSIS — F902 Attention-deficit hyperactivity disorder, combined type: Secondary | ICD-10-CM | POA: Diagnosis not present

## 2018-12-07 DIAGNOSIS — F411 Generalized anxiety disorder: Secondary | ICD-10-CM | POA: Diagnosis not present

## 2018-12-10 ENCOUNTER — Ambulatory Visit: Payer: Self-pay | Admitting: Obstetrics and Gynecology

## 2018-12-10 DIAGNOSIS — J208 Acute bronchitis due to other specified organisms: Secondary | ICD-10-CM | POA: Diagnosis not present

## 2018-12-10 DIAGNOSIS — J0140 Acute pansinusitis, unspecified: Secondary | ICD-10-CM | POA: Diagnosis not present

## 2019-01-08 ENCOUNTER — Encounter: Payer: Self-pay | Admitting: Obstetrics and Gynecology

## 2019-01-08 ENCOUNTER — Ambulatory Visit: Payer: Self-pay | Admitting: Obstetrics and Gynecology

## 2019-01-14 ENCOUNTER — Ambulatory Visit: Payer: Self-pay | Admitting: Obstetrics and Gynecology

## 2019-02-26 ENCOUNTER — Emergency Department (HOSPITAL_COMMUNITY): Payer: Medicare Other

## 2019-02-26 ENCOUNTER — Emergency Department (HOSPITAL_COMMUNITY)
Admission: EM | Admit: 2019-02-26 | Discharge: 2019-02-26 | Disposition: A | Discharge status: Home / Self Care | Payer: Medicare Other | Attending: Emergency Medicine | Admitting: Emergency Medicine

## 2019-02-26 ENCOUNTER — Encounter (HOSPITAL_COMMUNITY): Payer: Self-pay

## 2019-02-26 ENCOUNTER — Other Ambulatory Visit: Payer: Self-pay

## 2019-02-26 DIAGNOSIS — F909 Attention-deficit hyperactivity disorder, unspecified type: Secondary | ICD-10-CM | POA: Insufficient documentation

## 2019-02-26 DIAGNOSIS — Z7901 Long term (current) use of anticoagulants: Secondary | ICD-10-CM | POA: Insufficient documentation

## 2019-02-26 DIAGNOSIS — F4323 Adjustment disorder with mixed anxiety and depressed mood: Secondary | ICD-10-CM | POA: Diagnosis not present

## 2019-02-26 DIAGNOSIS — R52 Pain, unspecified: Secondary | ICD-10-CM | POA: Diagnosis not present

## 2019-02-26 DIAGNOSIS — R197 Diarrhea, unspecified: Secondary | ICD-10-CM | POA: Diagnosis not present

## 2019-02-26 DIAGNOSIS — J45909 Unspecified asthma, uncomplicated: Secondary | ICD-10-CM | POA: Insufficient documentation

## 2019-02-26 DIAGNOSIS — R1084 Generalized abdominal pain: Secondary | ICD-10-CM | POA: Diagnosis not present

## 2019-02-26 DIAGNOSIS — R112 Nausea with vomiting, unspecified: Secondary | ICD-10-CM | POA: Diagnosis not present

## 2019-02-26 DIAGNOSIS — K449 Diaphragmatic hernia without obstruction or gangrene: Secondary | ICD-10-CM | POA: Diagnosis not present

## 2019-02-26 DIAGNOSIS — R109 Unspecified abdominal pain: Secondary | ICD-10-CM | POA: Diagnosis present

## 2019-02-26 DIAGNOSIS — R1013 Epigastric pain: Secondary | ICD-10-CM | POA: Insufficient documentation

## 2019-02-26 DIAGNOSIS — Z96652 Presence of left artificial knee joint: Secondary | ICD-10-CM | POA: Insufficient documentation

## 2019-02-26 DIAGNOSIS — Z79899 Other long term (current) drug therapy: Secondary | ICD-10-CM | POA: Insufficient documentation

## 2019-02-26 DIAGNOSIS — I10 Essential (primary) hypertension: Secondary | ICD-10-CM | POA: Insufficient documentation

## 2019-02-26 LAB — CBC WITH DIFFERENTIAL/PLATELET
Abs Immature Granulocytes: 0.03 10*3/uL (ref 0.00–0.07)
BASOS ABS: 0 10*3/uL (ref 0.0–0.1)
Basophils Relative: 0 %
EOS ABS: 0 10*3/uL (ref 0.0–0.5)
EOS PCT: 0 %
HEMATOCRIT: 28.4 % — AB (ref 36.0–46.0)
HEMOGLOBIN: 7.7 g/dL — AB (ref 12.0–15.0)
Immature Granulocytes: 0 %
LYMPHS ABS: 1.3 10*3/uL (ref 0.7–4.0)
Lymphocytes Relative: 16 %
MCH: 18.6 pg — AB (ref 26.0–34.0)
MCHC: 27.1 g/dL — ABNORMAL LOW (ref 30.0–36.0)
MCV: 68.8 fL — AB (ref 80.0–100.0)
Monocytes Absolute: 0.6 10*3/uL (ref 0.1–1.0)
Monocytes Relative: 7 %
NRBC: 0 % (ref 0.0–0.2)
Neutro Abs: 6.4 10*3/uL (ref 1.7–7.7)
Neutrophils Relative %: 77 %
Platelets: 391 10*3/uL (ref 150–400)
RBC: 4.13 MIL/uL (ref 3.87–5.11)
RDW: 20 % — ABNORMAL HIGH (ref 11.5–15.5)
WBC: 8.3 10*3/uL (ref 4.0–10.5)

## 2019-02-26 LAB — URINALYSIS, ROUTINE W REFLEX MICROSCOPIC
Bilirubin Urine: NEGATIVE
Glucose, UA: NEGATIVE mg/dL
Ketones, ur: 20 mg/dL — AB
Leukocytes,Ua: NEGATIVE
NITRITE: NEGATIVE
PROTEIN: NEGATIVE mg/dL
SPECIFIC GRAVITY, URINE: 1.006 (ref 1.005–1.030)
pH: 7 (ref 5.0–8.0)

## 2019-02-26 LAB — COMPREHENSIVE METABOLIC PANEL
ALK PHOS: 54 U/L (ref 38–126)
ALT: 11 U/L (ref 0–44)
AST: 24 U/L (ref 15–41)
Albumin: 4 g/dL (ref 3.5–5.0)
Anion gap: 6 (ref 5–15)
BILIRUBIN TOTAL: 0.6 mg/dL (ref 0.3–1.2)
BUN: 7 mg/dL (ref 6–20)
CALCIUM: 8.7 mg/dL — AB (ref 8.9–10.3)
CO2: 24 mmol/L (ref 22–32)
CREATININE: 0.53 mg/dL (ref 0.44–1.00)
Chloride: 105 mmol/L (ref 98–111)
Glucose, Bld: 110 mg/dL — ABNORMAL HIGH (ref 70–99)
Potassium: 3.2 mmol/L — ABNORMAL LOW (ref 3.5–5.1)
Sodium: 135 mmol/L (ref 135–145)
Total Protein: 8.7 g/dL — ABNORMAL HIGH (ref 6.5–8.1)

## 2019-02-26 LAB — LIPASE, BLOOD: Lipase: 32 U/L (ref 11–51)

## 2019-02-26 LAB — I-STAT BETA HCG BLOOD, ED (MC, WL, AP ONLY)

## 2019-02-26 MED ORDER — AMOXICILLIN-POT CLAVULANATE 875-125 MG PO TABS: 1.0000 | ORAL_TABLET | Freq: Two times a day (BID) | ORAL | 0 refills | Status: DC

## 2019-02-26 MED ORDER — ONDANSETRON 4 MG PO TBDP: 4.0000 mg | ORAL_TABLET | Freq: Three times a day (TID) | ORAL | 0 refills | Status: AC | PRN

## 2019-02-26 MED ORDER — DICYCLOMINE HCL 10 MG/ML IM SOLN
20.0000 mg | Freq: Once | INTRAMUSCULAR | Status: AC
  Administered 2019-02-26: 20 mg via INTRAMUSCULAR
  Filled 2019-02-26: qty 2

## 2019-02-26 MED ORDER — DICYCLOMINE HCL 20 MG PO TABS: 20.0000 mg | ORAL_TABLET | Freq: Two times a day (BID) | ORAL | 0 refills | Status: DC

## 2019-02-26 MED ORDER — MORPHINE SULFATE (PF) 4 MG/ML IV SOLN
4.0000 mg | Freq: Once | INTRAVENOUS | Status: AC
  Administered 2019-02-26: 4 mg via INTRAVENOUS
  Filled 2019-02-26: qty 1

## 2019-02-26 MED ORDER — ONDANSETRON HCL 4 MG/2ML IJ SOLN
4.0000 mg | Freq: Once | INTRAMUSCULAR | Status: AC
  Administered 2019-02-26: 4 mg via INTRAVENOUS
  Filled 2019-02-26: qty 2

## 2019-02-26 MED ORDER — IOPAMIDOL (ISOVUE-300) INJECTION 61%
100.0000 mL | Freq: Once | INTRAVENOUS | Status: AC | PRN
  Administered 2019-02-26: 100 mL via INTRAVENOUS

## 2019-02-26 MED ORDER — IOPAMIDOL (ISOVUE-300) INJECTION 61%
INTRAVENOUS | Status: AC
  Filled 2019-02-26: qty 100

## 2019-02-26 MED ORDER — SODIUM CHLORIDE 0.9 % IV BOLUS
1000.0000 mL | Freq: Once | INTRAVENOUS | Status: AC
  Administered 2019-02-26: 1000 mL via INTRAVENOUS

## 2019-02-26 MED ORDER — SODIUM CHLORIDE (PF) 0.9 % IJ SOLN
INTRAMUSCULAR | Status: AC
  Filled 2019-02-26: qty 50

## 2019-02-26 NOTE — ED Notes (Signed)
Pt has graham crackers, and ginger ale at bedside for PO Challenge. Will continue to assess for attempt to consume.

## 2019-02-26 NOTE — Discharge Instructions (Addendum)
You were evaluated today for abdominal pain. Your CT scan showed possible enteritis. This is possible viral or bacterial in nature. We have given you a prescription for antibiotics. Please take as prescribed. You will also need to follow up with your PCP regarding your low Hemoglobin. I have also given you Bentyl for you abdominal cramping and Zofran for your Nausea. Please take as prescribed.

## 2019-02-26 NOTE — ED Triage Notes (Signed)
Per EMS, patient coming from home with complaints of abdominal pain that started about a day ago. Patient endorses N/V/D. Pain is located above the belly button and is described as a sharp pain that comes and goes.    Patient started feeling bad while she was at her birthday party. Patient reports only eating cake, but states that everyone else ate the cake and no one else has gotten sick.

## 2019-02-26 NOTE — ED Provider Notes (Signed)
[facility] DEPT Provider Note   CSN: [id number] Arrival date & time: 02/26/19  0350    History   Chief Complaint Chief Complaint  Patient presents with  . Abdominal Pain    HPI Kristen Swanson is a 43 y.o. female.     Patient with past medical history remarkable for pulmonary emboli anticoagulated on Eliquis, presents to the emergency department with a chief complaint of abdominal pain.  She reports associated nausea, vomiting, diarrhea.  He states that the symptoms started yesterday evening.  States that she had a slice of birthday cake prior to the symptoms starting.  She did not eat anything else yesterday.  She denies any dysuria, hematuria, or new or unusual vaginal discharge.  She is on her menstrual cycle.  She denies prior abdominal surgeries.  The pain does not radiate to her back.  She has tried Gas-X and OTC antacids with no relief.  She denies seeing any blood in her vomit or stool.  She rates her pain is moderate and crampy in nature.    The history is provided by the patient. No language interpreter was used.    Past Medical History:  Diagnosis Date  . ADHD (attention deficit hyperactivity disorder)   . Adjustment disorder with mixed anxiety and depressed mood   . Asthma   . Fibroid tumor   . Hypertension   . Kidney stones   . Microcytic anemia   . Pulmonary emboli [facility])     Patient Active Problem List   Diagnosis Date Noted  . Vaginal itching 01/26/2018  . Left knee injury, subsequent encounter 11/22/2017  . Herpes genitalis in women 09/11/2017  . Family history of colon cancer in mother 08/21/2017  . Pain in left tibia 06/12/2017  . 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 Healthcare maintenance 03/26/2017  . HTN (hypertension) 03/26/2017  . Pulmonary emboli ([place]) 03/19/2017  . Microcytic anemia   . Fibroid uterus 01/01/2016  . Menorrhagia 01/01/2016  . Obesity 02/26/2007  . Asthma 02/26/2007    Past Surgical  History:  Procedure Laterality Date  . LEG SURGERY    . REPLACEMENT TOTAL KNEE Left 2008     OB History    Gravida  5   Para  3   Term  3   Preterm      AB  2   Living  3     SAB  1   TAB      Ectopic      Multiple      Live Births               Home Medications    Prior to Admission medications   Medication Sig Start Date End Date Taking? Authorizing Provider  amphetamine-dextroamphetamine (ADDERALL XR) 15 MG 24 hr capsule Take 15 mg by mouth daily.    Yes [provider]  apixaban (ELIQUIS) 5 MG TABS tablet Take 1 tablet (5 mg total) by mouth 2 (two) times daily. 04/16/18  Yes Ledell Noss, MD  hydrochlorothiazide (HYDRODIURIL) 25 MG tablet Take 1 tablet (25 mg total) by mouth daily. 08/25/17  Yes Ledell Noss, MD  acetaminophen (TYLENOL) 325 MG tablet Take 2 tablets (650 mg total) by mouth every 6 (six) hours as needed for moderate pain. Patient not taking: Reported on 11/13/2018 03/21/17   Thurnell Lose, MD  albuterol (PROVENTIL HFA;VENTOLIN HFA) 108 (90 Base) MCG/ACT inhaler Inhale 2 puffs into the lungs every 4 (four) hours as needed for wheezing  or shortness of breath. Patient not taking: Reported on 11/13/2018 10/14/16   Delsa Grana, PA-C  diclofenac sodium (VOLTAREN) 1 % GEL Apply 2 g topically 4 (four) times daily. Patient not taking: Reported on 11/13/2018 09/11/17   Ina Homes, MD  docusate sodium (COLACE) 100 MG capsule Take 2 capsules (200 mg total) by mouth 2 (two) times daily. Patient not taking: Reported on 11/13/2018 03/21/17   Thurnell Lose, MD  ferrous gluconate (FERGON) 324 MG tablet Take 1 tablet (324 mg total) by mouth daily with breakfast. Patient not taking: Reported on 11/13/2018 08/21/17   Ledell Noss, MD  traMADol (ULTRAM) 50 MG tablet Take 1 tablet (50 mg total) by mouth every 6 (six) hours as needed (painful periods). Patient not taking: Reported on 02/26/2019 11/09/18   Glenice Bow, DO    Family History Family  History  Problem Relation Age of Onset  . Colon cancer Mother 3  . Stroke Father   . Hypertension Father   . Diabetes type I Daughter   . Autism Son   . Lung cancer Maternal Uncle     Social History Social History   Tobacco Use  . Smoking status: Never Smoker  . Smokeless tobacco: Never Used  Substance Use Topics  . Alcohol use: No  . Drug use: No     Allergies   Patient has no known allergies.   Review of Systems Review of Systems  All other systems reviewed and are negative.    Physical Exam Updated Vital Signs BP (!) 164/99 (BP Location: Right Arm)   Pulse 79   Temp 97.7 F (36.5 C) (Oral)   Resp 18   SpO2 100%   Physical Exam Vitals signs and nursing note reviewed.  Constitutional:      Appearance: She is well-developed.  HENT:     Head: Normocephalic and atraumatic.  Eyes:     Conjunctiva/sclera: Conjunctivae normal.     Pupils: Pupils are equal, round, and reactive to light.  Neck:     Musculoskeletal: Normal range of motion and neck supple.  Cardiovascular:     Rate and Rhythm: Normal rate and regular rhythm.     Heart sounds: No murmur. No friction rub. No gallop.   Pulmonary:     Effort: Pulmonary effort is normal. No respiratory distress.     Breath sounds: Normal breath sounds. No wheezing or rales.  Chest:     Chest wall: No tenderness.  Abdominal:     General: Bowel sounds are normal. There is no distension.     Palpations: Abdomen is soft. There is no mass.     Tenderness: There is no abdominal tenderness. There is no guarding or rebound.     Comments: Generalized abdominal discomfort, no focal tenderness, no Murphy sign, no tenderness to palpation at McBurney's point  Musculoskeletal: Normal range of motion.        General: No tenderness.  Skin:    General: Skin is warm and dry.  Neurological:     Mental Status: She is alert and oriented to person, place, and time.  Psychiatric:        Behavior: Behavior normal.        Thought  Content: Thought content normal.        Judgment: Judgment normal.      ED Treatments / Results  Labs (all labs ordered are listed, but only abnormal results are displayed) Labs Reviewed  CBC WITH DIFFERENTIAL/PLATELET  COMPREHENSIVE METABOLIC PANEL  LIPASE, BLOOD  URINALYSIS, ROUTINE W REFLEX MICROSCOPIC  I-STAT BETA HCG BLOOD, ED ([facility], [facility], [facility] ONLY)    EKG None  Radiology No results found.  Procedures Procedures (including critical care time)  Medications Ordered in ED Medications  ondansetron (ZOFRAN) injection 4 mg (has no administration in time range)  morphine 4 MG/ML injection 4 mg (has no administration in time range)  sodium chloride 0.9 % bolus 1,000 mL (has no administration in time range)     Initial Impression / Assessment and Plan / ED Course  I have reviewed the triage vital signs and the nursing notes.  Pertinent labs & imaging results that were available during my care of the patient were reviewed by me and considered in my medical decision making (see chart for details).        Patient with nausea, vomiting, and diarrhea.  Reports associated generalized abdominal discomfort.  Onset of symptoms was last night.  Will treat pain and check labs.  Will reassess abdomen.  Labs are pending.  Patient signed out to [facility], PA-C, who will continue care.  Plan:  Follow-up on labs Reassess abdomen  Final Clinical Impressions(s) / ED Diagnoses   Final diagnoses:  None    ED Discharge Orders    None       Montine Circle, PA-C 02/26/19 0557    Ripley Fraise, MD 02/26/19 [phone]

## 2019-02-26 NOTE — ED Notes (Signed)
Bed: DH68 Expected date:  Expected time:  Means of arrival:  Comments: 43 yr old abdominal pain

## 2019-02-26 NOTE — ED Provider Notes (Signed)
Care transferred from [facility] at shift change. See note for full HPI.  In summation, 43 year old with a PMHx significant for PE on Eliquis who presents to the ED with abdominal pain with N/V/D. Symptoms began yesterday evening. Emesis NBNB. Denies melena or BRPBR. No prior abdominal surgeries. Currently on menstrual cycle. Has taken Gas-X and antacids without reflief. Pain rated a 9/10. Denies additional aggravating or alleviating factors. Pain is generalized in nature. No radiation.   Plan for labs, urine and serial Abd exam and reevaluate.  Getting Zofran and Morphine. Physical Exam  BP (!) 162/83   Pulse 76   Temp 97.7 F (36.5 C) (Oral)   Resp 17   LMP 02/25/2019 Comment: neg hcg  SpO2 100%   Physical Exam Vitals signs and nursing note reviewed.  Constitutional:      General: She is not in acute distress.    Appearance: She is well-developed. She is not ill-appearing, toxic-appearing or diaphoretic.     Comments: Sleeping on initial evaluation.  Arousable by voice.  No complaints.  HENT:     Head: Atraumatic.     Mouth/Throat:     Lips: Pink.     Mouth: Mucous membranes are moist.     Pharynx: Oropharynx is clear. Uvula midline. No pharyngeal swelling, oropharyngeal exudate, posterior oropharyngeal erythema or uvula swelling.     Tonsils: No tonsillar exudate or tonsillar abscesses. Swelling: 0 on the right. 0 on the left.  Eyes:     Pupils: Pupils are equal, round, and reactive to light.  Neck:     Musculoskeletal: Normal range of motion.  Cardiovascular:     Rate and Rhythm: Normal rate.     Heart sounds: Normal heart sounds.  Pulmonary:     Effort: No respiratory distress.     Comments: Clear to auscultation bilaterally without wheeze rhonchi rales.  No accessory muscle usage.  Able speak in full sentences without difficulty. Abdominal:     General: There is no distension.     Comments: Soft without rebound.  Generalized tenderness palpation with guarding.   Normoactive bowel sounds.  Musculoskeletal: Normal range of motion.     Comments: Moves all extremities without difficulty.  Ambulatory in department that difficulty.  Skin:    General: Skin is warm and dry.     Comments: No rashes or lesions.  Neurological:     Mental Status: She is alert.    ED Course/Procedures     Procedures Labs Reviewed  CBC WITH DIFFERENTIAL/PLATELET - Abnormal; Notable for the following components:      Result Value   Hemoglobin 7.7 (*)    HCT 28.4 (*)    MCV 68.8 (*)    MCH 18.6 (*)    MCHC 27.1 (*)    RDW 20.0 (*)    All other components within normal limits  COMPREHENSIVE METABOLIC PANEL - Abnormal; Notable for the following components:   Potassium 3.2 (*)    Glucose, Bld 110 (*)    Calcium 8.7 (*)    Total Protein 8.7 (*)    All other components within normal limits  URINALYSIS, ROUTINE W REFLEX MICROSCOPIC - Abnormal; Notable for the following components:   Color, Urine STRAW (*)    Hgb urine dipstick MODERATE (*)    Ketones, ur 20 (*)    Bacteria, UA RARE (*)    All other components within normal limits  LIPASE, BLOOD  I-STAT BETA HCG BLOOD, ED ([facility], [facility], [facility] ONLY)  Ct Abdomen  Pelvis W Contrast  Result Date: 02/26/2019 CLINICAL DATA:  Abdominal pain with nausea and vomiting. EXAM: CT ABDOMEN AND PELVIS WITH CONTRAST TECHNIQUE: Multidetector CT imaging of the abdomen and pelvis was performed using the standard protocol following bolus administration of intravenous contrast. CONTRAST:  176mL ISOVUE-300 IOPAMIDOL (ISOVUE-300) INJECTION 61% COMPARISON:  October 13, 2016 FINDINGS: Lower chest: There is bibasilar atelectasis. Lungs elsewhere are clear. There is a small hiatal hernia. Hepatobiliary: No focal liver lesions are appreciable. There is no evident gallbladder wall thickening. No biliary duct dilatation is appreciable. Pancreas: No pancreatic mass or inflammatory focus. Spleen: No splenic lesion evident. Adrenals/Urinary Tract: Adrenals  bilaterally appear normal. Kidneys bilaterally show no appreciable mass or hydronephrosis on either side. There is no renal or ureteral calculus on either side. Urinary bladder is midline with wall thickness within normal limits for degree of urinary bladder distention. Stomach/Bowel: There is no appreciable bowel wall or mesenteric thickening. Most bowel loops are fluid-filled. No demonstrable bowel obstruction. There is no free air or portal venous air. Vascular/Lymphatic: There is no abdominal aortic aneurysm. No vascular lesions are appreciable. There is no adenopathy in the abdomen or pelvis. Reproductive: The uterus is anteverted. The uterus is enlarged. There are multiple uterine masses consistent with leiomyomas. Largest individual leiomyoma is toward the rightward aspect of the uterus measuring 6.2 x 6.0 cm. No extrauterine pelvic masses are appreciable. Other: The appendix appears normal. There is no abscess or ascites in the abdomen or pelvis. There is a small ventral hernia containing only fat. Musculoskeletal: No blastic or lytic bone lesions. No intramuscular lesions are evident. IMPRESSION: 1.  Enlarged, leiomyomatous uterus. 2. Most bowel loops are fluid-filled. This finding potentially could indicate a degree of enteritis or early ileus. No evident bowel obstruction. There is no bowel wall or mesenteric thickening. 3. Appendix appears normal. There is no appreciable abscess in the abdomen or pelvis. 4.  Small hiatal hernia.  Small ventral hernia containing only fat. Electronically Signed   By: Lowella Grip III M.D.   On: 02/26/2019 08:42   MDM  43 year old who appears otherwise well presents for evaluation of abdominal pain with nausea, emesis and diarrhea. Afebrile, nonseptic, non-ill appearing. Care transferred from [facility] with plan to get labs, urine and serial abdominal exams.  0710: Abdomen with generalized tenderness to palpation, No rebound, mild guarding, Pain with mild  improvement after Zofran and Morphine. No episodes of emesis since antiemetics. CBC without leukocytosis, Hgb 7.7, previous 8.2, has hx of iron deficiency anemia, supposed to be on iron supplementation however has not been taking this x 7 months. Denies melena, hematochezia, lightheadedness or dizziness. Anemia noted at previous ED visit and pt was told at that time to follow up with PCP. Has not followed up. Discussed with patient she need to follow up and possible get an iron transfusion. Likely anemia secondary to heavy menstrual cycles, which she is currently on and anticoagulation. No sx to suggest GI bleeding as cause of anemia. Metabolic panel with hypokalemia at 3.2, no additional electrolyte, renal or liver abnormalities, lipase, 32, Hcg negtive.  0900: CT scan with possible enteritis, Abdomen with generalized tenderness to epigastric area, pain a 7/10, Will PO trial and reevaluate. Discussed enteritis of viral vs bacterial in nature.   1030: Able to tolerate PO intake without difficulty. Patient is nontoxic, nonseptic appearing, in no apparent distress.  Patient's pain and other symptoms adequately managed in emergency department.  Fluid bolus given.  Labs, imaging and vitals  reviewed.  Patient does not meet the SIRS or Sepsis criteria.  On repeat exam patient does not have a surgical abdomin and there are no peritoneal signs.  No indication of appendicitis, bowel obstruction, bowel perforation, cholecystitis, diverticulitis, PID or ectopic pregnancy.  Patient discharged home with symptomatic treatment and given strict instructions for follow-up with their primary care physician for HTN and anemia.  I have also discussed reasons to return immediately to the ER.  Patient expresses understanding and agrees with plan.     Henderly, Britni A, PA-C 02/26/19 1043    Ripley Fraise, MD 02/26/19 2306

## 2019-05-17 ENCOUNTER — Telehealth (INDEPENDENT_AMBULATORY_CARE_PROVIDER_SITE_OTHER): Payer: Medicare Other

## 2019-05-17 DIAGNOSIS — D259 Leiomyoma of uterus, unspecified: Secondary | ICD-10-CM

## 2019-05-17 NOTE — Telephone Encounter (Signed)
Pt call stating that she is having pain with her menstrual cycles and would like a call back.

## 2019-05-19 ENCOUNTER — Telehealth: Payer: Self-pay | Admitting: Lactation Services

## 2019-05-19 DIAGNOSIS — N946 Dysmenorrhea, unspecified: Secondary | ICD-10-CM

## 2019-05-19 DIAGNOSIS — D219 Benign neoplasm of connective and other soft tissue, unspecified: Secondary | ICD-10-CM

## 2019-05-19 MED ORDER — TRAMADOL HCL 50 MG PO TABS: 50.0000 mg | ORAL_TABLET | Freq: Four times a day (QID) | ORAL | 0 refills | Status: DC | PRN

## 2019-05-19 NOTE — Telephone Encounter (Signed)
Called pt back and spoke with patient in regards to pain with her Menstrual cycle. She reports the pain is excruciating and radiates to her back. Pt reports pain is 9.5. Pt reports she is not able to get out of bed and function right now. Pt reports she is pretty weak. She reports this is the worse cycle she has had in a long time.   She is not able to take Ibuprofen since on blood thinner.   She reports she is taking Tylenol and it is not helping. She reports Tramadol helps some and she takes at bedtime and is out of that prescription currently. Pt is using a heating pad.   Pt reports her bleeding is very heavy. She is changing her pad and tampon about every 30 minutes. She reports the the pad is pretty soaked. She is able to go longer at times and sleeps with a towel at night.   Spoke with Dr. Ilda Basset who recommended that I send an in basket message on pt. to Dr. Rip Harbour.

## 2019-05-19 NOTE — Telephone Encounter (Signed)
Called patient back after talking with Dr. Rip Harbour. Discussed with pt that Dr. Rip Harbour is willing to call in Tramadol for her and recommended pt follow up with Dr. Ihor Dow. Discussed with Pt that appt with Dr. Ihor Dow will most likely be virtual, pt voiced understanding. Pt appreciative of actions taken.   Prescription was called into Patients pharmacy as would not send through [facility], wanted to print instead.

## 2019-05-19 NOTE — Telephone Encounter (Signed)
I have not seen this pt since 10/2016 So I am somewhat at a loss here. We can refill here Tramadol for the pain. Last office note by Dr Juleen China, pt wanted to see Dr Purvis Kilts to discuss surgery. Recommend if pt still desires they we make appt with her. Thanks Legrand Como

## 2019-05-27 DIAGNOSIS — M25532 Pain in left wrist: Secondary | ICD-10-CM | POA: Diagnosis not present

## 2019-06-16 ENCOUNTER — Telehealth: Payer: Self-pay | Admitting: Medical

## 2019-06-16 NOTE — Telephone Encounter (Signed)
Called the patient to confirm the appointment. Received a message we're sorry the person you are trying to reach has a voicemail box is full and cannot receive messages at this time. Good-bye.

## 2019-06-18 ENCOUNTER — Other Ambulatory Visit: Payer: Self-pay

## 2019-06-18 ENCOUNTER — Encounter: Payer: Medicare Other | Admitting: Obstetrics & Gynecology

## 2019-06-18 NOTE — Progress Notes (Signed)
@  0820 No Answer Voicemail box Full @0825  No Answer Voicemail box Full

## 2019-06-22 DIAGNOSIS — M79605 Pain in left leg: Secondary | ICD-10-CM | POA: Diagnosis not present

## 2019-06-22 DIAGNOSIS — G8929 Other chronic pain: Secondary | ICD-10-CM | POA: Diagnosis not present

## 2019-06-22 DIAGNOSIS — D509 Iron deficiency anemia, unspecified: Secondary | ICD-10-CM | POA: Diagnosis not present

## 2019-06-22 DIAGNOSIS — Z800 Family history of malignant neoplasm of digestive organs: Secondary | ICD-10-CM | POA: Diagnosis not present

## 2019-06-22 DIAGNOSIS — K5900 Constipation, unspecified: Secondary | ICD-10-CM | POA: Diagnosis not present

## 2019-06-22 DIAGNOSIS — S8992XD Unspecified injury of left lower leg, subsequent encounter: Secondary | ICD-10-CM | POA: Diagnosis not present

## 2019-06-22 DIAGNOSIS — M898X6 Other specified disorders of bone, lower leg: Secondary | ICD-10-CM | POA: Diagnosis not present

## 2019-06-22 DIAGNOSIS — I2699 Other pulmonary embolism without acute cor pulmonale: Secondary | ICD-10-CM | POA: Diagnosis not present

## 2019-06-24 ENCOUNTER — Encounter: Payer: Self-pay | Admitting: *Deleted

## 2019-07-07 ENCOUNTER — Other Ambulatory Visit: Payer: Self-pay | Admitting: Critical Care Medicine

## 2019-07-07 DIAGNOSIS — Z20822 Contact with and (suspected) exposure to covid-19: Secondary | ICD-10-CM

## 2019-07-07 DIAGNOSIS — R6889 Other general symptoms and signs: Secondary | ICD-10-CM | POA: Diagnosis not present

## 2019-07-12 LAB — NOVEL CORONAVIRUS, NAA: SARS-CoV-2, NAA: NOT DETECTED

## 2019-07-13 ENCOUNTER — Telehealth (INDEPENDENT_AMBULATORY_CARE_PROVIDER_SITE_OTHER): Payer: Medicare Other | Admitting: General Practice

## 2019-07-13 ENCOUNTER — Other Ambulatory Visit: Payer: Self-pay | Admitting: Obstetrics & Gynecology

## 2019-07-13 DIAGNOSIS — N939 Abnormal uterine and vaginal bleeding, unspecified: Secondary | ICD-10-CM

## 2019-07-13 DIAGNOSIS — D219 Benign neoplasm of connective and other soft tissue, unspecified: Secondary | ICD-10-CM

## 2019-07-13 DIAGNOSIS — N946 Dysmenorrhea, unspecified: Secondary | ICD-10-CM

## 2019-07-13 MED ORDER — MEGESTROL ACETATE 40 MG PO TABS: 40.0000 mg | ORAL_TABLET | Freq: Two times a day (BID) | ORAL | 2 refills | Status: DC

## 2019-07-13 MED ORDER — TRAMADOL HCL 50 MG PO TABS: 50.0000 mg | ORAL_TABLET | Freq: Four times a day (QID) | ORAL | 1 refills | Status: DC | PRN

## 2019-07-13 NOTE — Telephone Encounter (Signed)
Patient called and left message on nurse voicemail line stating she had an appt for her fibroids but missed the appt because she wasn't sure if she was having period issues or COVID. Patient states she did start her period recently and is now in a lot of pain due to the cramping. She reports she takes a medication for the bleeding that works well but she is now out and needs a refill.  Called patient and asked what medication she needs a refill on. Patient states she needs a refill on tramadol. She states she cannot take NSAIDS due to being on blood thinners. Patient states her periods are very painful and tylenol doesn't help. She states she missed an appt with [place] because she thought she was getting sick and didn't want to come in. Called and discussed with Dr Harolyn Rutherford who refill tramadol and ordered patient to begin megace 40mg  BID. Discussed with patient and told her we would get her missed appt rescheduled. Patient verbalized understanding & had no questions.

## 2019-07-19 ENCOUNTER — Telehealth: Payer: Self-pay | Admitting: Obstetrics & Gynecology

## 2019-07-19 NOTE — Telephone Encounter (Signed)
Done

## 2019-07-19 NOTE — Telephone Encounter (Signed)
Attempted to call patient with her appointment with Dr. Ihor Dow on 7/30 @ 1:55 mychart. No answer, left voicemail with the appointment information. 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 My instructed to give the office a call back if needing to reschedule. Appointment reminder mailed.

## 2019-07-29 ENCOUNTER — Other Ambulatory Visit: Payer: Self-pay

## 2019-07-29 ENCOUNTER — Encounter: Payer: Medicare Other | Admitting: Obstetrics & Gynecology

## 2019-07-29 NOTE — Progress Notes (Signed)
1:27 I called Aleeya to see if she is able to start her virtual visit early- left message I was calling for your virtual visit and will call back closer to your appointment time. Donshay Lupinski,RN  1:57 Vihana is not connected virtually  I called Araseli again and left a message I was calling for your virtual visit and since I have not reached you several trys we we will need to have you call to reschedule . Qianna Clagett,RN

## 2019-07-29 NOTE — Progress Notes (Signed)
1347- Attempted to call patient, no answer. Left message to call us back for your appt

## 2019-08-31 DIAGNOSIS — I10 Essential (primary) hypertension: Secondary | ICD-10-CM | POA: Diagnosis not present

## 2019-08-31 DIAGNOSIS — A6009 Herpesviral infection of other urogenital tract: Secondary | ICD-10-CM | POA: Diagnosis not present

## 2019-08-31 DIAGNOSIS — R51 Headache: Secondary | ICD-10-CM | POA: Diagnosis not present

## 2019-09-15 DIAGNOSIS — M545 Low back pain: Secondary | ICD-10-CM | POA: Diagnosis not present

## 2019-09-21 DIAGNOSIS — F411 Generalized anxiety disorder: Secondary | ICD-10-CM | POA: Diagnosis not present

## 2019-09-21 DIAGNOSIS — F902 Attention-deficit hyperactivity disorder, combined type: Secondary | ICD-10-CM | POA: Diagnosis not present

## 2019-10-03 ENCOUNTER — Other Ambulatory Visit: Payer: Self-pay

## 2019-10-03 ENCOUNTER — Emergency Department (HOSPITAL_BASED_OUTPATIENT_CLINIC_OR_DEPARTMENT_OTHER): Payer: Medicare Other

## 2019-10-03 ENCOUNTER — Emergency Department (HOSPITAL_BASED_OUTPATIENT_CLINIC_OR_DEPARTMENT_OTHER)
Admission: EM | Admit: 2019-10-03 | Discharge: 2019-10-03 | Disposition: A | Discharge status: Home / Self Care | Payer: Medicare Other | Attending: Emergency Medicine | Admitting: Emergency Medicine

## 2019-10-03 DIAGNOSIS — Z86711 Personal history of pulmonary embolism: Secondary | ICD-10-CM | POA: Insufficient documentation

## 2019-10-03 DIAGNOSIS — D259 Leiomyoma of uterus, unspecified: Secondary | ICD-10-CM | POA: Insufficient documentation

## 2019-10-03 DIAGNOSIS — J45909 Unspecified asthma, uncomplicated: Secondary | ICD-10-CM | POA: Diagnosis not present

## 2019-10-03 DIAGNOSIS — R0789 Other chest pain: Secondary | ICD-10-CM | POA: Diagnosis not present

## 2019-10-03 DIAGNOSIS — Z7901 Long term (current) use of anticoagulants: Secondary | ICD-10-CM | POA: Insufficient documentation

## 2019-10-03 DIAGNOSIS — I10 Essential (primary) hypertension: Secondary | ICD-10-CM | POA: Insufficient documentation

## 2019-10-03 DIAGNOSIS — E876 Hypokalemia: Secondary | ICD-10-CM | POA: Diagnosis not present

## 2019-10-03 DIAGNOSIS — Z79899 Other long term (current) drug therapy: Secondary | ICD-10-CM | POA: Insufficient documentation

## 2019-10-03 DIAGNOSIS — D649 Anemia, unspecified: Secondary | ICD-10-CM | POA: Diagnosis not present

## 2019-10-03 DIAGNOSIS — Z3202 Encounter for pregnancy test, result negative: Secondary | ICD-10-CM | POA: Insufficient documentation

## 2019-10-03 DIAGNOSIS — D219 Benign neoplasm of connective and other soft tissue, unspecified: Secondary | ICD-10-CM

## 2019-10-03 DIAGNOSIS — Z96652 Presence of left artificial knee joint: Secondary | ICD-10-CM | POA: Insufficient documentation

## 2019-10-03 DIAGNOSIS — R079 Chest pain, unspecified: Secondary | ICD-10-CM | POA: Diagnosis not present

## 2019-10-03 LAB — BASIC METABOLIC PANEL
Anion gap: 12 (ref 5–15)
BUN: 10 mg/dL (ref 6–20)
CO2: 21 mmol/L — ABNORMAL LOW (ref 22–32)
Calcium: 9.2 mg/dL (ref 8.9–10.3)
Chloride: 101 mmol/L (ref 98–111)
Creatinine, Ser: 0.68 mg/dL (ref 0.44–1.00)
GFR calc Af Amer: >60 mL/min (ref >60)
GFR calc non Af Amer: >60 mL/min (ref >60)
Glucose, Bld: 109 mg/dL — ABNORMAL HIGH (ref 70–99)
Potassium: 2.9 mmol/L — ABNORMAL LOW (ref 3.5–5.1)
Sodium: 134 mmol/L — ABNORMAL LOW (ref 135–145)

## 2019-10-03 LAB — CBC
HCT: 33.3 % — ABNORMAL LOW (ref 36.0–46.0)
Hemoglobin: 9.9 g/dL — ABNORMAL LOW (ref 12.0–15.0)
MCH: 22.2 pg — ABNORMAL LOW (ref 26.0–34.0)
MCHC: 29.7 g/dL — ABNORMAL LOW (ref 30.0–36.0)
MCV: 74.8 fL — ABNORMAL LOW (ref 80.0–100.0)
Platelets: 290 10*3/uL (ref 150–400)
RBC: 4.45 MIL/uL (ref 3.87–5.11)
RDW: 19.9 % — ABNORMAL HIGH (ref 11.5–15.5)
WBC: 6 10*3/uL (ref 4.0–10.5)

## 2019-10-03 LAB — URINALYSIS, ROUTINE W REFLEX MICROSCOPIC
Bilirubin Urine: NEGATIVE
Glucose, UA: NEGATIVE mg/dL
Hgb urine dipstick: NEGATIVE
Ketones, ur: NEGATIVE mg/dL
Nitrite: NEGATIVE
Protein, ur: NEGATIVE mg/dL
Specific Gravity, Urine: 1.01 (ref 1.005–1.030)
pH: 6 (ref 5.0–8.0)

## 2019-10-03 LAB — URINALYSIS, MICROSCOPIC (REFLEX)

## 2019-10-03 LAB — TROPONIN I (HIGH SENSITIVITY)
Troponin I (High Sensitivity): <2 ng/L (ref <18)
Troponin I (High Sensitivity): <2 ng/L (ref <18)

## 2019-10-03 LAB — MAGNESIUM: Magnesium: 2.2 mg/dL (ref 1.7–2.4)

## 2019-10-03 LAB — PREGNANCY, URINE: Preg Test, Ur: NEGATIVE

## 2019-10-03 MED ORDER — IOHEXOL 300 MG/ML  SOLN
100.0000 mL | Freq: Once | INTRAMUSCULAR | Status: AC | PRN
  Administered 2019-10-03: 21:00:00 100 mL via INTRAVENOUS

## 2019-10-03 MED ORDER — POTASSIUM CHLORIDE CRYS ER 20 MEQ PO TBCR
40.0000 meq | EXTENDED_RELEASE_TABLET | Freq: Once | ORAL | Status: AC
  Administered 2019-10-03: 22:00:00 40 meq via ORAL
  Filled 2019-10-03: qty 2

## 2019-10-03 MED ORDER — FERROUS SULFATE 325 (65 FE) MG PO TABS: 325.0000 mg | ORAL_TABLET | Freq: Every day | ORAL | 0 refills | Status: AC

## 2019-10-03 MED ORDER — POTASSIUM CHLORIDE ER 10 MEQ PO TBCR: 10.0000 meq | EXTENDED_RELEASE_TABLET | Freq: Every day | ORAL | 0 refills | Status: AC

## 2019-10-03 MED ORDER — MORPHINE SULFATE (PF) 4 MG/ML IV SOLN
4.0000 mg | Freq: Once | INTRAVENOUS | Status: AC
  Administered 2019-10-03: 19:00:00 4 mg via INTRAVENOUS
  Filled 2019-10-03: qty 1

## 2019-10-03 MED ORDER — ONDANSETRON HCL 4 MG/2ML IJ SOLN
4.0000 mg | Freq: Once | INTRAMUSCULAR | Status: AC
  Administered 2019-10-03: 19:00:00 4 mg via INTRAVENOUS
  Filled 2019-10-03: qty 2

## 2019-10-03 NOTE — ED Notes (Signed)
Patient transported to CT 

## 2019-10-03 NOTE — ED Triage Notes (Addendum)
Pt reports left shoulder pain, left side radiating into chest x 2 hours. Endorses SOB. Hx of PE

## 2019-10-03 NOTE — Discharge Instructions (Addendum)
Take your Eliquis as directed.

## 2019-10-03 NOTE — ED Notes (Signed)
ED Provider at bedside. 

## 2019-10-03 NOTE — ED Provider Notes (Signed)
[place] EMERGENCY DEPARTMENT Provider Note   CSN: [id number] Arrival date & time: 10/03/19  [place]     History   Chief Complaint Chief Complaint  Patient presents with  . Chest Pain    HPI Kristen Swanson is a 43 y.o. female.     Pt presents to the ED today with cp and sob.  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  However, she does not take it every day, especially when she has her period as it makes her bleed too much.  Pt said she was talking with her daughter and felt like she could not catch her breath.  This feels similar to her prior PE.     Past Medical History:  Diagnosis Date  . ADHD (attention deficit hyperactivity disorder)   . Adjustment disorder with mixed anxiety and depressed mood   . Asthma   . Fibroid tumor   . Hypertension   . Kidney stones   . Microcytic anemia   . Pulmonary emboli [facility])     Patient Active Problem List   Diagnosis Date Noted  . Vaginal itching 01/26/2018  . Left knee injury, subsequent encounter 11/22/2017  . Herpes genitalis in women 09/11/2017  . Family history of colon cancer in mother 08/21/2017  . Pain in left tibia 06/12/2017  . 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 Healthcare maintenance 03/26/2017  . HTN (hypertension) 03/26/2017  . Pulmonary emboli ([place]) 03/19/2017  . Microcytic anemia   . Fibroid uterus 01/01/2016  . Menorrhagia 01/01/2016  . Obesity 02/26/2007  . Asthma 02/26/2007    Past Surgical History:  Procedure Laterality Date  . LEG SURGERY    . REPLACEMENT TOTAL KNEE Left 2008     OB History    Gravida  5   Para  3   Term  3   Preterm      AB  2   Living  3     SAB  1   TAB      Ectopic      Multiple      Live Births               Home Medications    Prior to Admission medications   Medication Sig Start Date End Date Taking? Authorizing Provider  amphetamine-dextroamphetamine (ADDERALL XR) 15 MG 24 hr capsule Take 15 mg by mouth daily.    Yes [provider]  apixaban (ELIQUIS) 5 MG TABS tablet Take 1 tablet (5 mg total) by mouth 2 (two) times daily. 04/16/18  Yes Ledell Noss, MD  hydrochlorothiazide (HYDRODIURIL) 25 MG tablet Take 1 tablet (25 mg total) by mouth daily. 08/25/17  Yes Ledell Noss, MD  megestrol (MEGACE) 40 MG tablet Take 1 tablet (40 mg total) by mouth 2 (two) times daily. 07/13/19  Yes Anyanwu, Sallyanne Havers, MD  traMADol (ULTRAM) 50 MG tablet Take 1 tablet (50 mg total) by mouth every 6 (six) hours as needed for moderate pain or severe pain (painful periods). 07/13/19  Yes Anyanwu, Sallyanne Havers, MD  amoxicillin-clavulanate (AUGMENTIN) 875-125 MG tablet Take 1 tablet by mouth every 12 (twelve) hours. 02/26/19   Henderly, Britni A, PA-C  dicyclomine (BENTYL) 20 MG tablet Take 1 tablet (20 mg total) by mouth 2 (two) times daily. 02/26/19   Henderly, Britni A, PA-C  ferrous sulfate 325 (65 FE) MG tablet Take 1 tablet (325 mg total) by mouth daily. 10/03/19   Isla Pence, MD  ondansetron (ZOFRAN ODT)  4 MG disintegrating tablet Take 1 tablet (4 mg total) by mouth every 8 (eight) hours as needed for nausea or vomiting. 02/26/19   Henderly, Britni A, PA-C  potassium chloride (KLOR-CON) 10 MEQ tablet Take 1 tablet (10 mEq total) by mouth daily. 10/03/19   Isla Pence, MD    Family History Family History  Problem Relation Age of Onset  . Colon cancer Mother 21  . Stroke Father   . Hypertension Father   . Diabetes type I Daughter   . Autism Son   . Lung cancer Maternal Uncle     Social History Social History   Tobacco Use  . Smoking status: Never Smoker  . Smokeless tobacco: Never Used  Substance Use Topics  . Alcohol use: No  . Drug use: No     Allergies   Patient has no known allergies.   Review of Systems Review of Systems  Respiratory: Positive for shortness of breath.   Cardiovascular: Positive for chest pain.  All other systems reviewed and are negative.    Physical Exam Updated Vital Signs BP 125/76    Pulse 86   Temp 98.8 F (37.1 C) (Oral)   Resp (!) 22   Ht 5\' 9"  (1.753 m)   Wt 117.5 kg   SpO2 100%   BMI 38.25 kg/m   Physical Exam Vitals signs and nursing note reviewed.  Constitutional:      Appearance: She is well-developed.  HENT:     Head: Normocephalic and atraumatic.  Eyes:     Extraocular Movements: Extraocular movements intact.     Pupils: Pupils are equal, round, and reactive to light.  Neck:     Musculoskeletal: Normal range of motion and neck supple.  Cardiovascular:     Rate and Rhythm: Normal rate and regular rhythm.     Heart sounds: Normal heart sounds.  Pulmonary:     Effort: Pulmonary effort is normal.     Breath sounds: Normal breath sounds.  Abdominal:     General: Bowel sounds are normal.     Palpations: Abdomen is soft.  Musculoskeletal: Normal range of motion.  Skin:    General: Skin is warm.     Capillary Refill: Capillary refill takes less than 2 seconds.  Neurological:     General: No focal deficit present.     Mental Status: She is alert and oriented to person, place, and time.  Psychiatric:        Mood and Affect: Mood normal.        Behavior: Behavior normal.      ED Treatments / Results  Labs (all labs ordered are listed, but only abnormal results are displayed) Labs Reviewed  BASIC METABOLIC PANEL - Abnormal; Notable for the following components:      Result Value   Sodium 134 (*)    Potassium 2.9 (*)    CO2 21 (*)    Glucose, Bld 109 (*)    All other components within normal limits  CBC - Abnormal; Notable for the following components:   Hemoglobin 9.9 (*)    HCT 33.3 (*)    MCV 74.8 (*)    MCH 22.2 (*)    MCHC 29.7 (*)    RDW 19.9 (*)    All other components within normal limits  URINALYSIS, ROUTINE W REFLEX MICROSCOPIC - Abnormal; Notable for the following components:   Leukocytes,Ua MODERATE (*)    All other components within normal limits  URINALYSIS, MICROSCOPIC (REFLEX) - Abnormal; Notable for  the following  components:   Bacteria, UA FEW (*)    All other components within normal limits  PREGNANCY, URINE  MAGNESIUM  TROPONIN I (HIGH SENSITIVITY)  TROPONIN I (HIGH SENSITIVITY)    EKG EKG Interpretation  Date/Time:  Sunday October 03 2019 18:44:19 EDT Ventricular Rate:  96 PR Interval:    QRS Duration: 90 QT Interval:  359 QTC Calculation: 454 R Axis:   47 Text Interpretation:  Sinus rhythm Borderline T abnormalities, inferior leads No significant change since last tracing Confirmed by Isla Pence [phone]) on 10/03/2019 7:00:06 PM   Radiology Ct Angio Chest Pe W And/or Wo Contrast  Result Date: 10/03/2019 CLINICAL DATA:  43 year old female with left upper chest pain for the past 4 hours. No shortness of breath. EXAM: CT ANGIOGRAPHY CHEST WITH CONTRAST TECHNIQUE: Multidetector CT imaging of the chest was performed using the standard protocol during bolus administration of intravenous contrast. Multiplanar CT image reconstructions and MIPs were obtained to evaluate the vascular anatomy. CONTRAST:  127mL OMNIPAQUE IOHEXOL 300 MG/ML  SOLN COMPARISON:  Chest CTA 03/19/2017. FINDINGS: Cardiovascular: No filling defects within the pulmonary arterial tree to suggest underlying pulmonary embolism. Heart size is normal. There is no significant pericardial fluid, thickening or pericardial calcification. No atherosclerotic calcifications in the thoracic aorta or the coronary arteries. Mediastinum/Nodes: No pathologically enlarged mediastinal or hilar lymph nodes. Esophagus is unremarkable in appearance. Multiple prominent borderline enlarged axillary lymph nodes bilaterally, nonspecific. Lungs/Pleura: No acute consolidative airspace disease. No pleural effusions. 4 mm subpleural nodule in the periphery of the left lower lobe (axial image 49 of series 8), stable compared to 03/19/2017, considered definitively benign (likely a subpleural lymph node). No other suspicious appearing pulmonary nodules or masses  are noted. Upper Abdomen: Incompletely imaged nonobstructive calculus in the upper pole collecting system of the right kidney measuring at least 2 mm. Musculoskeletal: There are no aggressive appearing lytic or blastic lesions noted in the visualized portions of the skeleton. Review of the MIP images confirms the above findings. IMPRESSION: 1. No acute findings to account for the patient's symptoms. Specifically, no evidence of pulmonary embolism. 2. Nonobstructive calculus in the upper pole collecting system of the right kidney incompletely imaged but measuring at least 2 mm. Electronically Signed   By: Vinnie Langton M.D.   On: 10/03/2019 21:07    Procedures Procedures (including critical care time)  Medications Ordered in ED Medications  potassium chloride SA (KLOR-CON) CR tablet 40 mEq (has no administration in time range)  morphine 4 MG/ML injection 4 mg (4 mg Intravenous Given 10/03/19 1908)  ondansetron (ZOFRAN) injection 4 mg (4 mg Intravenous Given 10/03/19 1906)  iohexol (OMNIPAQUE) 300 MG/ML solution 100 mL (100 mLs Intravenous Contrast Given 10/03/19 2051)     Initial Impression / Assessment and Plan / ED Course  I have reviewed the triage vital signs and the nursing notes.  Pertinent labs & imaging results that were available during my care of the patient were reviewed by me and considered in my medical decision making (see chart for details).       Pt does not have a PE.  Troponin and EKG ok.  Pt is hypokalemic, but magnesium is nl.  She is given Kdur here and put on kdur for home.  The pt is anemic, but a little better than nl.  Pt will be put on iron.  She has a hx of fibroids and said her periods are so heavy that when she sleeps, she has to put down  several towels or the blood will soak through to her mattress.  She has not been wanting to take the Eliquis because it makes the bleeding even worse.  Her obgyn has not wanted to do the hyst due to the blood thinners.  Pt has a  hx of an unprovoked PE, but no known clotting d/o.  I will have her see Dr. Marin Olp to see if she still needs to be on blood thinners or not.  Pt is stable for d/c home.  Return if worse.  Final Clinical Impressions(s) / ED Diagnoses   Final diagnoses:  Hypokalemia  Atypical chest pain  History of pulmonary embolism  Fibroids  Anemia, unspecified type    ED Discharge Orders         Ordered    ferrous sulfate 325 (65 FE) MG tablet  Daily     10/03/19 2121    potassium chloride (KLOR-CON) 10 MEQ tablet  Daily     10/03/19 2121           Isla Pence, MD 10/03/19 2126

## 2019-11-03 ENCOUNTER — Other Ambulatory Visit: Payer: Self-pay | Admitting: Emergency Medicine

## 2019-11-03 DIAGNOSIS — N946 Dysmenorrhea, unspecified: Secondary | ICD-10-CM

## 2019-11-03 DIAGNOSIS — D219 Benign neoplasm of connective and other soft tissue, unspecified: Secondary | ICD-10-CM

## 2019-11-03 NOTE — Telephone Encounter (Signed)
Pt called the nurse voicemail line and left a message stating that she was out of her pain medication. Pt stated her menstrual cycle has started and she has taken tylenol with no relief.

## 2019-11-04 ENCOUNTER — Telehealth: Payer: Self-pay | Admitting: Emergency Medicine

## 2019-11-04 MED ORDER — TRAMADOL HCL 50 MG PO TABS: 50.0000 mg | ORAL_TABLET | Freq: Four times a day (QID) | ORAL | 1 refills | Status: DC | PRN

## 2019-11-04 NOTE — Addendum Note (Signed)
Addended by: Verita Schneiders A on: 11/04/2019 10:30 AM   Modules accepted: Orders

## 2019-11-04 NOTE — Telephone Encounter (Signed)
Tramadol refilled (unable to take NSAIDs) for dysmenorrhea.    Verita Schneiders, MD

## 2019-11-04 NOTE — Telephone Encounter (Signed)
Called pt and informed her that her request for tramadol refill has been completed and she should be able to pick up at the pharmacy. Pt verbalized understanding and had no further questions.

## 2019-12-07 ENCOUNTER — Emergency Department (HOSPITAL_BASED_OUTPATIENT_CLINIC_OR_DEPARTMENT_OTHER): Payer: Medicare Other

## 2019-12-07 ENCOUNTER — Encounter (HOSPITAL_BASED_OUTPATIENT_CLINIC_OR_DEPARTMENT_OTHER): Payer: Self-pay

## 2019-12-07 ENCOUNTER — Emergency Department (HOSPITAL_BASED_OUTPATIENT_CLINIC_OR_DEPARTMENT_OTHER)
Admission: EM | Admit: 2019-12-07 | Discharge: 2019-12-07 | Disposition: A | Discharge status: Home / Self Care | Payer: Medicare Other | Attending: Emergency Medicine | Admitting: Emergency Medicine

## 2019-12-07 ENCOUNTER — Other Ambulatory Visit: Payer: Self-pay

## 2019-12-07 DIAGNOSIS — J4521 Mild intermittent asthma with (acute) exacerbation: Secondary | ICD-10-CM | POA: Insufficient documentation

## 2019-12-07 DIAGNOSIS — Z7901 Long term (current) use of anticoagulants: Secondary | ICD-10-CM | POA: Diagnosis not present

## 2019-12-07 DIAGNOSIS — Z86711 Personal history of pulmonary embolism: Secondary | ICD-10-CM | POA: Diagnosis not present

## 2019-12-07 DIAGNOSIS — R0602 Shortness of breath: Secondary | ICD-10-CM | POA: Diagnosis present

## 2019-12-07 DIAGNOSIS — Z79899 Other long term (current) drug therapy: Secondary | ICD-10-CM | POA: Insufficient documentation

## 2019-12-07 DIAGNOSIS — I10 Essential (primary) hypertension: Secondary | ICD-10-CM | POA: Diagnosis not present

## 2019-12-07 DIAGNOSIS — R062 Wheezing: Secondary | ICD-10-CM | POA: Diagnosis not present

## 2019-12-07 DIAGNOSIS — J4531 Mild persistent asthma with (acute) exacerbation: Secondary | ICD-10-CM

## 2019-12-07 LAB — COMPREHENSIVE METABOLIC PANEL
ALT: 15 U/L (ref 0–44)
AST: 40 U/L (ref 15–41)
Albumin: 3.3 g/dL — ABNORMAL LOW (ref 3.5–5.0)
Alkaline Phosphatase: 56 U/L (ref 38–126)
Anion gap: 9 (ref 5–15)
BUN: 5 mg/dL — ABNORMAL LOW (ref 6–20)
CO2: 24 mmol/L (ref 22–32)
Calcium: 8.8 mg/dL — ABNORMAL LOW (ref 8.9–10.3)
Chloride: 103 mmol/L (ref 98–111)
Creatinine, Ser: 0.69 mg/dL (ref 0.44–1.00)
GFR calc Af Amer: >60 mL/min (ref >60)
GFR calc non Af Amer: >60 mL/min (ref >60)
Glucose, Bld: 106 mg/dL — ABNORMAL HIGH (ref 70–99)
Potassium: 3.3 mmol/L — ABNORMAL LOW (ref 3.5–5.1)
Sodium: 136 mmol/L (ref 135–145)
Total Bilirubin: 1.1 mg/dL (ref 0.3–1.2)
Total Protein: 7.9 g/dL (ref 6.5–8.1)

## 2019-12-07 LAB — CBC WITH DIFFERENTIAL/PLATELET
Abs Immature Granulocytes: 0.01 10*3/uL (ref 0.00–0.07)
Basophils Absolute: 0 10*3/uL (ref 0.0–0.1)
Basophils Relative: 0 %
Eosinophils Absolute: 0.4 10*3/uL (ref 0.0–0.5)
Eosinophils Relative: 7 %
HCT: 30.6 % — ABNORMAL LOW (ref 36.0–46.0)
Hemoglobin: 8.9 g/dL — ABNORMAL LOW (ref 12.0–15.0)
Immature Granulocytes: 0 %
Lymphocytes Relative: 39 %
Lymphs Abs: 2.3 10*3/uL (ref 0.7–4.0)
MCH: 23 pg — ABNORMAL LOW (ref 26.0–34.0)
MCHC: 29.1 g/dL — ABNORMAL LOW (ref 30.0–36.0)
MCV: 79.1 fL — ABNORMAL LOW (ref 80.0–100.0)
Monocytes Absolute: 0.5 10*3/uL (ref 0.1–1.0)
Monocytes Relative: 9 %
Neutro Abs: 2.5 10*3/uL (ref 1.7–7.7)
Neutrophils Relative %: 45 %
Platelets: 197 10*3/uL (ref 150–400)
RBC: 3.87 MIL/uL (ref 3.87–5.11)
RDW: 17.5 % — ABNORMAL HIGH (ref 11.5–15.5)
WBC: 5.8 10*3/uL (ref 4.0–10.5)
nRBC: 0 % (ref 0.0–0.2)

## 2019-12-07 MED ORDER — ALBUTEROL SULFATE (5 MG/ML) 0.5% IN NEBU: 2.5000 mg | INHALATION_SOLUTION | Freq: Four times a day (QID) | RESPIRATORY_TRACT | 0 refills | Status: DC | PRN

## 2019-12-07 MED ORDER — PREDNISONE 50 MG PO TABS
60.0000 mg | ORAL_TABLET | Freq: Once | ORAL | Status: AC
  Administered 2019-12-07: 60 mg via ORAL
  Filled 2019-12-07: qty 1

## 2019-12-07 MED ORDER — MAGNESIUM SULFATE 50 % IJ SOLN
2.0000 g | Freq: Once | INTRAMUSCULAR | Status: AC
  Administered 2019-12-07: 2 g via INTRAVENOUS
  Filled 2019-12-07: qty 4

## 2019-12-07 MED ORDER — ALBUTEROL SULFATE HFA 108 (90 BASE) MCG/ACT IN AERS
8.0000 | INHALATION_SPRAY | RESPIRATORY_TRACT | Status: DC | PRN
  Administered 2019-12-07: 8 via RESPIRATORY_TRACT

## 2019-12-07 MED ORDER — IPRATROPIUM BROMIDE HFA 17 MCG/ACT IN AERS
INHALATION_SPRAY | RESPIRATORY_TRACT | Status: AC
  Filled 2019-12-07: qty 12.9

## 2019-12-07 MED ORDER — PREDNISONE 20 MG PO TABS: 40.0000 mg | ORAL_TABLET | Freq: Every day | ORAL | 0 refills | Status: AC

## 2019-12-07 MED ORDER — ALBUTEROL SULFATE HFA 108 (90 BASE) MCG/ACT IN AERS
INHALATION_SPRAY | RESPIRATORY_TRACT | Status: AC
  Filled 2019-12-07: qty 6.7

## 2019-12-07 MED ORDER — IPRATROPIUM BROMIDE HFA 17 MCG/ACT IN AERS
2.0000 | INHALATION_SPRAY | RESPIRATORY_TRACT | Status: DC | PRN
  Administered 2019-12-07: 14:00:00 2 via RESPIRATORY_TRACT

## 2019-12-07 NOTE — ED Notes (Signed)
Pt 100% 02 while ambulating in room.

## 2019-12-07 NOTE — ED Notes (Signed)
Pt requesting cough medication. PA informed.

## 2019-12-07 NOTE — ED Provider Notes (Signed)
[place] EMERGENCY DEPARTMENT Provider Note   CSN: [id number] Arrival date & time: 12/07/19  1353     History   Chief Complaint Chief Complaint  Patient presents with  . Shortness of Breath    HPI Kristen Swanson is a 43 y.o. female.     43 y.o female with a PMH of HTN, PE, Asthma presents to the ED with a chief complaint of shortness of breath x 3 days. Patient reports she has been out of her medication for the past weeks, she attempted to obtain a nebulizer refill from her PCP however this was refused that she needed to complete a televisit.  Patient reports she is been out of her spacer and inhaler since 2019.  She reports increasing shortness of breath, has been using her inhaler more frequently throughout the day.  Arrived in the ED with wheezing, she did receive 5 puffs of albuterol along with 2 of ipratropium which has helped some with her symptoms.  She reports no sick exposures to anyone with COVID-19 however this is the same time where her asthma usually exacerbates due to weather changes.  She also endorses a cough with no sputum.  She does complain of chest pain, this occurs when she coughs really hard.  He denies any fever, abdominal pain, leg swelling.  He does have a prior history of pulmonary embolism, currently noncompliant medication Eliquis. She does have a prior history of admission due to her asthma exacerbation, no prior intubations.  The history is provided by the patient and medical records.  Shortness of Breath Associated symptoms: cough and wheezing   Associated symptoms: no abdominal pain, no chest pain, no ear pain, no fever, no rash, no sore throat and no vomiting     Past Medical History:  Diagnosis Date  . ADHD (attention deficit hyperactivity disorder)   . Adjustment disorder with mixed anxiety and depressed mood   . Asthma   . Fibroid tumor   . Hypertension   . Kidney stones   . Microcytic anemia   . Pulmonary emboli [facility])     Patient  Active Problem List   Diagnosis Date Noted  . Vaginal itching 01/26/2018  . Left knee injury, subsequent encounter 11/22/2017  . Herpes genitalis in women 09/11/2017  . Family history of colon cancer in mother 08/21/2017  . Pain in left tibia 06/12/2017  . 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 Healthcare maintenance 03/26/2017  . HTN (hypertension) 03/26/2017  . Pulmonary emboli ([place]) 03/19/2017  . Microcytic anemia   . Fibroid uterus 01/01/2016  . Menorrhagia 01/01/2016  . Obesity 02/26/2007  . Asthma 02/26/2007    Past Surgical History:  Procedure Laterality Date  . LEG SURGERY    . REPLACEMENT TOTAL KNEE Left 2008     OB History    Gravida  5   Para  3   Term  3   Preterm      AB  2   Living  3     SAB  1   TAB      Ectopic      Multiple      Live Births               Home Medications    Prior to Admission medications   Medication Sig Start Date End Date Taking? Authorizing Provider  albuterol (PROVENTIL) (5 MG/ML) 0.5% nebulizer solution Take 0.5 mLs (2.5 mg total) by nebulization every 6 (six) hours as needed for up  to 30 doses for wheezing or shortness of breath. 12/07/19   Janeece Fitting, PA-C  amoxicillin-clavulanate (AUGMENTIN) 875-125 MG tablet Take 1 tablet by mouth every 12 (twelve) hours. 02/26/19   Henderly, Britni A, PA-C  amphetamine-dextroamphetamine (ADDERALL XR) 15 MG 24 hr capsule Take 15 mg by mouth daily.     [provider]  apixaban (ELIQUIS) 5 MG TABS tablet Take 1 tablet (5 mg total) by mouth 2 (two) times daily. 04/16/18   Ledell Noss, MD  dicyclomine (BENTYL) 20 MG tablet Take 1 tablet (20 mg total) by mouth 2 (two) times daily. 02/26/19   Henderly, Britni A, PA-C  ferrous sulfate 325 (65 FE) MG tablet Take 1 tablet (325 mg total) by mouth daily. 10/03/19   Isla Pence, MD  hydrochlorothiazide (HYDRODIURIL) 25 MG tablet Take 1 tablet (25 mg total) by mouth daily. 08/25/17   Ledell Noss, MD  megestrol (MEGACE) 40 MG  tablet Take 1 tablet (40 mg total) by mouth 2 (two) times daily. 07/13/19   Anyanwu, Sallyanne Havers, MD  ondansetron (ZOFRAN ODT) 4 MG disintegrating tablet Take 1 tablet (4 mg total) by mouth every 8 (eight) hours as needed for nausea or vomiting. 02/26/19   Henderly, Britni A, PA-C  potassium chloride (KLOR-CON) 10 MEQ tablet Take 1 tablet (10 mEq total) by mouth daily. 10/03/19   Isla Pence, MD  predniSONE (DELTASONE) 20 MG tablet Take 2 tablets (40 mg total) by mouth daily for 5 days. 12/07/19 12/12/19  Janeece Fitting, PA-C  traMADol (ULTRAM) 50 MG tablet Take 1 tablet (50 mg total) by mouth every 6 (six) hours as needed for moderate pain or severe pain (painful periods). 11/04/19   Osborne Oman, MD    Family History Family History  Problem Relation Age of Onset  . Colon cancer Mother 43  . Stroke Father   . Hypertension Father   . Diabetes type I Daughter   . Autism Son   . Lung cancer Maternal Uncle     Social History Social History   Tobacco Use  . Smoking status: Never Smoker  . Smokeless tobacco: Never Used  Substance Use Topics  . Alcohol use: No  . Drug use: No     Allergies   Patient has no known allergies.   Review of Systems Review of Systems  Constitutional: Negative for chills and fever.  HENT: Negative for ear pain and sore throat.   Eyes: Negative for pain and visual disturbance.  Respiratory: Positive for cough, shortness of breath and wheezing.   Cardiovascular: Negative for chest pain and palpitations.  Gastrointestinal: Negative for abdominal pain and vomiting.  Genitourinary: Negative for dysuria and hematuria.  Musculoskeletal: Negative for arthralgias and back pain.  Skin: Negative for color change and rash.  Neurological: Negative for seizures and syncope.  All other systems reviewed and are negative.    Physical Exam Updated Vital Signs BP (!) 134/91 (BP Location: Left Arm)   Pulse 100   Temp 98.3 F (36.8 C) (Oral)   Resp 18   Ht 5'  10" (1.778 m)   Wt 114.3 kg   LMP 11/27/2019   SpO2 100%   BMI 36.16 kg/m   Physical Exam Vitals signs and nursing note reviewed.  Constitutional:      General: She is not in acute distress.    Appearance: She is well-developed. She is ill-appearing.  HENT:     Head: Normocephalic and atraumatic.     Mouth/Throat:     Mouth: Mucous  membranes are moist.     Pharynx: No oropharyngeal exudate.  Neck:     Musculoskeletal: Normal range of motion and neck supple.  Cardiovascular:     Rate and Rhythm: Normal rate.  Pulmonary:     Effort: Pulmonary effort is normal. Tachypnea present. No accessory muscle usage.     Breath sounds: No stridor. Examination of the right-upper field reveals wheezing. Examination of the left-upper field reveals wheezing. Examination of the right-middle field reveals wheezing. Examination of the right-lower field reveals wheezing. Examination of the left-lower field reveals wheezing. Wheezing present.  Chest:     Chest wall: No tenderness.  Musculoskeletal:     Comments: No bilateral pitting edema. No calf tenderness or swelling.  Skin:    General: Skin is warm and dry.  Neurological:     Mental Status: She is alert and oriented to person, place, and time.      ED Treatments / Results  Labs (all labs ordered are listed, but only abnormal results are displayed) Labs Reviewed  CBC WITH DIFFERENTIAL/PLATELET - Abnormal; Notable for the following components:      Result Value   Hemoglobin 8.9 (*)    HCT 30.6 (*)    MCV 79.1 (*)    MCH 23.0 (*)    MCHC 29.1 (*)    RDW 17.5 (*)    All other components within normal limits  COMPREHENSIVE METABOLIC PANEL - Abnormal; Notable for the following components:   Potassium 3.3 (*)    Glucose, Bld 106 (*)    BUN 5 (*)    Calcium 8.8 (*)    Albumin 3.3 (*)    All other components within normal limits    EKG None  Radiology Dg Chest Portable 1 View  Result Date: 12/07/2019 CLINICAL DATA:  Shortness of  breath and wheezing. EXAM: PORTABLE CHEST 1 VIEW COMPARISON:  November 13, 2018 FINDINGS: The heart size and mediastinal contours are within normal limits. Both lungs are clear. The visualized skeletal structures are unremarkable. IMPRESSION: No active disease. Electronically Signed   By: Dorise Bullion III M.D   On: 12/07/2019 15:00    Procedures Procedures (including critical care time)  Medications Ordered in ED Medications  albuterol (VENTOLIN HFA) 108 (90 Base) MCG/ACT inhaler 8 puff (8 puffs Inhalation Not Given 12/07/19 1433)  ipratropium (ATROVENT HFA) inhaler 2 puff (2 puffs Inhalation Not Given 12/07/19 1433)  magnesium sulfate (IV Push/IM) injection 2 g (2 g Intravenous Given 12/07/19 1443)  predniSONE (DELTASONE) tablet 60 mg (60 mg Oral Given 12/07/19 1447)     Initial Impression / Assessment and Plan / ED Course  I have reviewed the triage vital signs and the nursing notes.  Pertinent labs & imaging results that were available during my care of the patient were reviewed by me and considered in my medical decision making (see chart for details).       Patient with a past medical history of asthma presents to the ED with complaints of wheezing for the past 3 days, she reports being out of her medication and attempting to contact her PCP however they were unable to do so due to the current pandemic.  She reports the wheezing has gotten out of control, is worse when she lays flat, she does have prior history of admissions due to her asthma exacerbation.  She has been out of her nebulizer treatment since 2019.  She denies any fevers, COVID-19 exposures, rhinorrhea, sore throat. She does report her asthma usually exacerbates  around this time a year.  He does have a prior history of pulmonary embolisms, currently on Eliquis, reports compliance with her medication.  She arrived in the ED with audible wheezing inspiratory and expiratory and tachypnea, oxygen is at 100% on room air.  She is  afebrile, blood pressure is normotensive. He was provided with 5 puffs of albuterol, 2 puffs of ipratropium this did help some with her symptoms however she is persistently wheezing, I feel that patient is likely more moderate to severe asthma exacerbation, she will be provided with 2 g of magnesium along with chest x-ray to further evaluate her condition.  Xray without any consolidation, pneumothorax or pleural effusion. CBC with no leukocytosis, hemoglobin is slightly decrease but this is at patient's baseline. CMP with some mild hypokalemia, will not replace on today's visit. Creatine level is within normal limits. LFT's are unremarkable.  She was reevaluated after receiving inhaler along with Mg infusion, reports improvement in symptoms. She does not have a prior history of DM, will place her on a short course of steroids to help with her wheezing. She was also given  She was not tested for covid 19 as she reports no exposures. Vitals are within normal limits. Patient stable for discharge.   Patient was ambulate with an oxymetry stating at 100% on RA, no tachycardia or tachypnea.   Kristen Swanson was evaluated in Emergency Department on 12/07/2019 for the symptoms described in the history of present illness. She was evaluated in the context of the global COVID-19 pandemic, which necessitated consideration that the patient might be at risk for infection with the SARS-CoV-2 virus that causes COVID-19. Institutional protocols and algorithms that pertain to the evaluation of patients at risk for COVID-19 are in a state of rapid change based on information released by regulatory bodies including the CDC and federal and state organizations. These policies and algorithms were followed during the patient's care in the ED.   Portions of this note were generated with [employer]. Dictation errors may occur despite best attempts at proofreading.  Final Clinical Impressions(s) / ED Diagnoses   Final  diagnoses:  Wheezing  Mild persistent asthma with exacerbation    ED Discharge Orders         Ordered    predniSONE (DELTASONE) 20 MG tablet  Daily     12/07/19 1534    albuterol (PROVENTIL) (5 MG/ML) 0.5% nebulizer solution  Every 6 hours PRN     12/07/19 1626           Janeece Fitting, PA-C 12/07/19 [place], [place], DO 12/08/19 [phone]

## 2019-12-07 NOTE — ED Triage Notes (Signed)
C/o SOB, wheezing x 3 dasy-using inhaler w/o relief-cough x 2 days-DOE noted

## 2019-12-07 NOTE — Discharge Instructions (Addendum)
Your xray today was normal.  I have prescribed steroids to help with your wheezing, please take 2 tablets daily for the next five days.Please be aware this medication can cause flushness, changes in appetite and insomnia.   You  may continue using your inhaler with the spacer.

## 2019-12-08 DIAGNOSIS — J4541 Moderate persistent asthma with (acute) exacerbation: Secondary | ICD-10-CM | POA: Diagnosis not present

## 2019-12-22 ENCOUNTER — Telehealth (INDEPENDENT_AMBULATORY_CARE_PROVIDER_SITE_OTHER): Payer: Medicare Other

## 2019-12-22 DIAGNOSIS — N898 Other specified noninflammatory disorders of vagina: Secondary | ICD-10-CM

## 2019-12-22 MED ORDER — METRONIDAZOLE 0.75 % VA GEL: 1.0000 | Freq: Every day | VAGINAL | 0 refills | Status: DC

## 2019-12-22 NOTE — Telephone Encounter (Signed)
Pt left message on nurse VM stating she may have BV and requesting treatment. Returned pt's phone call. Pt reports foul odor and white discharge. Explains that she has had BV in the past and this seems the same.  Metrogel rx sent to pt's preferred pharmacy per protocol. Pt encouraged to take as prescribed and contact the office if there is no improvement.

## 2020-01-01 ENCOUNTER — Emergency Department (HOSPITAL_BASED_OUTPATIENT_CLINIC_OR_DEPARTMENT_OTHER)
Admission: EM | Admit: 2020-01-01 | Discharge: 2020-01-01 | Disposition: A | Discharge status: Home / Self Care | Payer: Medicare Other | Attending: Emergency Medicine | Admitting: Emergency Medicine

## 2020-01-01 ENCOUNTER — Encounter (HOSPITAL_BASED_OUTPATIENT_CLINIC_OR_DEPARTMENT_OTHER): Payer: Self-pay

## 2020-01-01 ENCOUNTER — Other Ambulatory Visit: Payer: Self-pay

## 2020-01-01 DIAGNOSIS — Z5321 Procedure and treatment not carried out due to patient leaving prior to being seen by health care provider: Secondary | ICD-10-CM | POA: Insufficient documentation

## 2020-01-01 DIAGNOSIS — R05 Cough: Secondary | ICD-10-CM | POA: Insufficient documentation

## 2020-01-01 DIAGNOSIS — R509 Fever, unspecified: Secondary | ICD-10-CM | POA: Insufficient documentation

## 2020-01-01 DIAGNOSIS — R519 Headache, unspecified: Secondary | ICD-10-CM | POA: Diagnosis not present

## 2020-01-01 DIAGNOSIS — M7918 Myalgia, other site: Secondary | ICD-10-CM | POA: Diagnosis not present

## 2020-01-01 NOTE — ED Triage Notes (Signed)
Pt c/o fever, chills, HA, body aches, cough with symptoms starting on 12/27/19. Pt has + COVID contact.

## 2020-01-01 NOTE — ED Notes (Addendum)
Pt stating she will go to another hospital because the wait is too long. Pt encouraged to stay, but pt choosing to leave.

## 2020-05-03 ENCOUNTER — Ambulatory Visit: Payer: Medicare Other | Admitting: Family Medicine

## 2020-05-10 ENCOUNTER — Encounter: Payer: Self-pay | Admitting: Family Medicine

## 2020-05-10 ENCOUNTER — Ambulatory Visit: Payer: Medicare Other | Admitting: Family Medicine

## 2020-05-10 NOTE — Progress Notes (Signed)
Patient did not keep appointment today. She may call to reschedule.  

## 2020-08-15 ENCOUNTER — Other Ambulatory Visit: Payer: Self-pay | Admitting: Obstetrics & Gynecology

## 2020-08-15 DIAGNOSIS — N946 Dysmenorrhea, unspecified: Secondary | ICD-10-CM

## 2020-08-15 DIAGNOSIS — D219 Benign neoplasm of connective and other soft tissue, unspecified: Secondary | ICD-10-CM

## 2020-08-17 ENCOUNTER — Encounter: Payer: Self-pay | Admitting: *Deleted

## 2020-08-25 ENCOUNTER — Ambulatory Visit: Payer: Medicare Other | Admitting: Obstetrics and Gynecology

## 2021-01-05 DIAGNOSIS — R059 Cough, unspecified: Secondary | ICD-10-CM | POA: Diagnosis not present

## 2021-01-05 DIAGNOSIS — J029 Acute pharyngitis, unspecified: Secondary | ICD-10-CM | POA: Diagnosis not present

## 2021-01-05 DIAGNOSIS — Z20822 Contact with and (suspected) exposure to covid-19: Secondary | ICD-10-CM | POA: Diagnosis not present

## 2021-01-05 DIAGNOSIS — R509 Fever, unspecified: Secondary | ICD-10-CM | POA: Diagnosis not present

## 2021-01-31 ENCOUNTER — Emergency Department (HOSPITAL_BASED_OUTPATIENT_CLINIC_OR_DEPARTMENT_OTHER): Payer: Medicare Other

## 2021-01-31 ENCOUNTER — Other Ambulatory Visit: Payer: Self-pay

## 2021-01-31 ENCOUNTER — Encounter (HOSPITAL_BASED_OUTPATIENT_CLINIC_OR_DEPARTMENT_OTHER): Payer: Self-pay | Admitting: *Deleted

## 2021-01-31 ENCOUNTER — Emergency Department (HOSPITAL_BASED_OUTPATIENT_CLINIC_OR_DEPARTMENT_OTHER)
Admission: EM | Admit: 2021-01-31 | Discharge: 2021-01-31 | Disposition: A | Discharge status: Home / Self Care | Payer: Medicare Other | Attending: Emergency Medicine | Admitting: Emergency Medicine

## 2021-01-31 DIAGNOSIS — Z96652 Presence of left artificial knee joint: Secondary | ICD-10-CM | POA: Diagnosis not present

## 2021-01-31 DIAGNOSIS — R002 Palpitations: Secondary | ICD-10-CM

## 2021-01-31 DIAGNOSIS — R42 Dizziness and giddiness: Secondary | ICD-10-CM | POA: Diagnosis not present

## 2021-01-31 DIAGNOSIS — I10 Essential (primary) hypertension: Secondary | ICD-10-CM | POA: Insufficient documentation

## 2021-01-31 DIAGNOSIS — Z79899 Other long term (current) drug therapy: Secondary | ICD-10-CM | POA: Diagnosis not present

## 2021-01-31 DIAGNOSIS — J45909 Unspecified asthma, uncomplicated: Secondary | ICD-10-CM | POA: Insufficient documentation

## 2021-01-31 DIAGNOSIS — Z7901 Long term (current) use of anticoagulants: Secondary | ICD-10-CM | POA: Insufficient documentation

## 2021-01-31 LAB — URINALYSIS, ROUTINE W REFLEX MICROSCOPIC
Bilirubin Urine: NEGATIVE
Glucose, UA: NEGATIVE mg/dL
Hgb urine dipstick: NEGATIVE
Ketones, ur: NEGATIVE mg/dL
Leukocytes,Ua: NEGATIVE
Nitrite: NEGATIVE
Protein, ur: NEGATIVE mg/dL
Specific Gravity, Urine: 1.005 (ref 1.005–1.030)
pH: 5.5 (ref 5.0–8.0)

## 2021-01-31 LAB — CBC WITH DIFFERENTIAL/PLATELET
Abs Immature Granulocytes: 0.01 10*3/uL (ref 0.00–0.07)
Basophils Absolute: 0 10*3/uL (ref 0.0–0.1)
Basophils Relative: 1 %
Eosinophils Absolute: 0.4 10*3/uL (ref 0.0–0.5)
Eosinophils Relative: 7 %
HCT: 37 % (ref 36.0–46.0)
Hemoglobin: 11.7 g/dL — ABNORMAL LOW (ref 12.0–15.0)
Immature Granulocytes: 0 %
Lymphocytes Relative: 33 %
Lymphs Abs: 2 10*3/uL (ref 0.7–4.0)
MCH: 26.5 pg (ref 26.0–34.0)
MCHC: 31.6 g/dL (ref 30.0–36.0)
MCV: 83.7 fL (ref 80.0–100.0)
Monocytes Absolute: 0.6 10*3/uL (ref 0.1–1.0)
Monocytes Relative: 9 %
Neutro Abs: 3.2 10*3/uL (ref 1.7–7.7)
Neutrophils Relative %: 50 %
Platelets: 325 10*3/uL (ref 150–400)
RBC: 4.42 MIL/uL (ref 3.87–5.11)
RDW: 16.3 % — ABNORMAL HIGH (ref 11.5–15.5)
WBC: 6.2 10*3/uL (ref 4.0–10.5)
nRBC: 0 % (ref 0.0–0.2)

## 2021-01-31 LAB — BASIC METABOLIC PANEL
Anion gap: 10 (ref 5–15)
BUN: 11 mg/dL (ref 6–20)
CO2: 24 mmol/L (ref 22–32)
Calcium: 9.5 mg/dL (ref 8.9–10.3)
Chloride: 100 mmol/L (ref 98–111)
Creatinine, Ser: 0.65 mg/dL (ref 0.44–1.00)
GFR, Estimated: >60 mL/min (ref >60)
Glucose, Bld: 99 mg/dL (ref 70–99)
Potassium: 3.3 mmol/L — ABNORMAL LOW (ref 3.5–5.1)
Sodium: 134 mmol/L — ABNORMAL LOW (ref 135–145)

## 2021-01-31 LAB — D-DIMER, QUANTITATIVE: D-Dimer, Quant: 0.54 ug/mL-FEU — ABNORMAL HIGH (ref 0.00–0.50)

## 2021-01-31 MED ORDER — IOHEXOL 350 MG/ML SOLN
100.0000 mL | Freq: Once | INTRAVENOUS | Status: AC | PRN
  Administered 2021-01-31: 100 mL via INTRAVENOUS

## 2021-01-31 NOTE — ED Triage Notes (Signed)
C/o dizziness and " palpitations" x 1 hr

## 2021-01-31 NOTE — ED Provider Notes (Signed)
[facility] Provider Note: Georgena Spurling, MD, FACEP  CSN: [id number] MRN: [id number] ARRIVAL: 01/31/21 at [place]: [place]  Dizziness   HISTORY OF PRESENT ILLNESS  01/31/21 1:32 AM [name] is a 45 y.o. female with a remote history of pulmonary embolism.  She is here with lightheadedness and palpitations that began about 10:30 PM yesterday evening.  She took her blood pressure with a home blood pressure cuff and found it to be 85/59 with a pulse rate of 110.  The symptoms lasted about 40 minutes and resolved on their own.  She became concerned given her history of PE and came to the ED to be evaluated.  She is now asymptomatic.  She is not having chest pain or shortness of breath.  Symptoms were moderate when present.  Nothing made them better or worse except lying on her left side worsen her palpitations   Past Medical History:  Diagnosis Date  . ADHD (attention deficit hyperactivity disorder)   . Adjustment disorder with mixed anxiety and depressed mood   . Asthma   . Fibroid tumor   . Hypertension   . Kidney stones   . Microcytic anemia   . Pulmonary emboli [facility])     Past Surgical History:  Procedure Laterality Date  . LEG SURGERY    . REPLACEMENT TOTAL KNEE Left 2008    Family History  Problem Relation Age of Onset  . Colon cancer Mother 95  . Stroke Father   . Hypertension Father   . Diabetes type I Daughter   . Autism Son   . Lung cancer Maternal Uncle     Social History   Tobacco Use  . Smoking status: Never Smoker  . Smokeless tobacco: Never Used  Vaping Use  . Vaping Use: Never used  Substance Use Topics  . Alcohol use: No  . Drug use: No    Prior to Admission medications   Medication Sig Start Date End Date Taking? Authorizing Provider  albuterol (PROVENTIL) (5 MG/ML) 0.5% nebulizer solution Take 0.5 mLs (2.5 mg total) by nebulization every 6 (six) hours as needed for up to 30 doses for wheezing or shortness of  breath. 12/07/19   Janeece Fitting, PA-C  amphetamine-dextroamphetamine (ADDERALL XR) 15 MG 24 hr capsule Take 15 mg by mouth daily.     [provider]  apixaban (ELIQUIS) 5 MG TABS tablet Take 1 tablet (5 mg total) by mouth 2 (two) times daily. 04/16/18   Ledell Noss, MD  ferrous sulfate 325 (65 FE) MG tablet Take 1 tablet (325 mg total) by mouth daily. 10/03/19   Isla Pence, MD  hydrochlorothiazide (HYDRODIURIL) 25 MG tablet Take 1 tablet (25 mg total) by mouth daily. 08/25/17   Ledell Noss, MD  metroNIDAZOLE (METROGEL) 0.75 % vaginal gel Place 1 Applicatorful vaginally daily. 12/22/19   Luvenia Redden, PA-C  ondansetron (ZOFRAN ODT) 4 MG disintegrating tablet Take 1 tablet (4 mg total) by mouth every 8 (eight) hours as needed for nausea or vomiting. 02/26/19   Henderly, Britni A, PA-C  potassium chloride (KLOR-CON) 10 MEQ tablet Take 1 tablet (10 mEq total) by mouth daily. 10/03/19   Isla Pence, MD  traMADol (ULTRAM) 50 MG tablet TAKE 1 TABLET BY MOUTH EVERY 6 HOURS AS NEEDED FOR MODERATE PAIN OR SEVERE PAIN 08/28/20   Anyanwu, Sallyanne Havers, MD  dicyclomine (BENTYL) 20 MG tablet Take 1 tablet (20 mg total) by mouth 2 (two) times daily. 02/26/19 01/31/21  Henderly, Britni A, PA-C    Allergies Patient has no known allergies.   REVIEW OF SYSTEMS  Negative except as noted here or in the History of Present Illness.   PHYSICAL EXAMINATION  Initial Vital Signs Blood pressure 125/86, pulse 99, temperature 98.4 F (36.9 C), temperature source Oral, resp. rate 16, height 5\' 10"  (1.778 m), weight 122.5 kg, last menstrual period 12/27/2019, SpO2 97 %.  Examination General: Well-developed, well-nourished female in no acute distress; appearance consistent with age of record HENT: normocephalic; atraumatic Eyes: pupils equal, round and reactive to light; extraocular muscles intact Neck: supple Heart: regular rate and rhythm Lungs: clear to auscultation bilaterally Abdomen: soft; nondistended;  nontender; bowel sounds present Extremities: No deformity; full range of motion; pulses normal Neurologic: Awake, alert and oriented; motor function intact in all extremities and symmetric; no facial droop Skin: Warm and dry Psychiatric: Normal mood and affect   RESULTS  Summary of this visit's results, reviewed and interpreted by myself:   EKG Interpretation  Date/Time:  Wednesday January 31 2021 00:22:08 EST Ventricular Rate:  88 PR Interval:    QRS Duration: 79 QT Interval:  368 QTC Calculation: 446 R Axis:   64 Text Interpretation: Sinus rhythm Probable left atrial enlargement No significant change was found Confirmed by Shanon Rosser [phone]) on 01/31/2021 12:24:41 AM      Laboratory Studies: Results for orders placed or performed during the hospital encounter of 01/31/21 (from the past 24 hour(s))  CBC with Differential/Platelet     Status: Abnormal   Collection Time: 01/31/21 12:58 AM  Result Value Ref Range   WBC 6.2 4.0 - 10.5 K/uL   RBC 4.42 3.87 - 5.11 MIL/uL   Hemoglobin 11.7 (L) 12.0 - 15.0 g/dL   HCT 37.0 36.0 - 46.0 %   MCV 83.7 80.0 - 100.0 fL   MCH 26.5 26.0 - 34.0 pg   MCHC 31.6 30.0 - 36.0 g/dL   RDW 16.3 (H) 11.5 - 15.5 %   Platelets 325 150 - 400 K/uL   nRBC 0.0 0.0 - 0.2 %   Neutrophils Relative % 50 %   Neutro Abs 3.2 1.7 - 7.7 K/uL   Lymphocytes Relative 33 %   Lymphs Abs 2.0 0.7 - 4.0 K/uL   Monocytes Relative 9 %   Monocytes Absolute 0.6 0.1 - 1.0 K/uL   Eosinophils Relative 7 %   Eosinophils Absolute 0.4 0.0 - 0.5 K/uL   Basophils Relative 1 %   Basophils Absolute 0.0 0.0 - 0.1 K/uL   Immature Granulocytes 0 %   Abs Immature Granulocytes 0.01 0.00 - 0.07 K/uL  Urinalysis, Routine w reflex microscopic Urine, Clean Catch     Status: Abnormal   Collection Time: 01/31/21 12:58 AM  Result Value Ref Range   Color, Urine STRAW (A) YELLOW   APPearance CLOUDY (A) CLEAR   Specific Gravity, Urine 1.005 1.005 - 1.030   pH 5.5 5.0 - 8.0   Glucose,  UA NEGATIVE NEGATIVE mg/dL   Hgb urine dipstick NEGATIVE NEGATIVE   Bilirubin Urine NEGATIVE NEGATIVE   Ketones, ur NEGATIVE NEGATIVE mg/dL   Protein, ur NEGATIVE NEGATIVE mg/dL   Nitrite NEGATIVE NEGATIVE   Leukocytes,Ua NEGATIVE NEGATIVE  D-dimer, quantitative (not at [facility])     Status: Abnormal   Collection Time: 01/31/21  1:39 AM  Result Value Ref Range   D-Dimer, Quant 0.54 (H) 0.00 - 0.50 ug/mL-FEU  Basic metabolic panel     Status: Abnormal   Collection Time: 01/31/21  1:39  AM  Result Value Ref Range   Sodium 134 (L) 135 - 145 mmol/L   Potassium 3.3 (L) 3.5 - 5.1 mmol/L   Chloride 100 98 - 111 mmol/L   CO2 24 22 - 32 mmol/L   Glucose, Bld 99 70 - 99 mg/dL   BUN 11 6 - 20 mg/dL   Creatinine, Ser 0.65 0.44 - 1.00 mg/dL   Calcium 9.5 8.9 - 10.3 mg/dL   GFR, Estimated >60 >60 mL/min   Anion gap 10 5 - 15   Imaging Studies: CT Angio Chest PE W and/or Wo Contrast  Result Date: 01/31/2021 CLINICAL DATA:  Dizziness and palpitations. Hx HTN and asthma. EXAM: CT ANGIOGRAPHY CHEST WITH CONTRAST TECHNIQUE: Multidetector CT imaging of the chest was performed using the standard protocol during bolus administration of intravenous contrast. Multiplanar CT image reconstructions and MIPs were obtained to evaluate the vascular anatomy. CONTRAST:  145mL OMNIPAQUE IOHEXOL 350 MG/ML SOLN COMPARISON:  CT angio chest 10/03/2019, CT angio chest 03/19/2017, ultrasound axilla 05/28/2017 FINDINGS: Cardiovascular: Fairly satisfactory opacification of the pulmonary arteries to the segmental level. No evidence of pulmonary embolism. Normal heart size. No significant pericardial effusion. The thoracic aorta is normal in caliber. 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 No atherosclerotic plaque of the thoracic aorta. No coronary artery calcifications. Mediastinum/Nodes: No enlarged mediastinal or hilar lymph nodes. Slight interval increase in size of bilateral axillary  lymphadenopathy as an example: Left 1.2 cm (4:43) and right 1.3 cm (4:20). Stable prominent 0.7 cm paraesophageal lymph node (4:63). Thyroid gland, trachea, and esophagus demonstrate no significant findings. Lungs/Pleura: No focal consolidation. Similar appearing subpleural ground-glass airspace opacity within left upper lobe (5:28, 33). Slight interval increase in size of a subpleural nodule measuring up to 7 mm (4 mm) in the left lower lobe (5:45). No new pulmonary nodule. No pulmonary mass. No pneumothorax. No pleural effusion. Upper Abdomen: No acute abnormality. Musculoskeletal: No chest wall abnormality. No acute or significant osseous findings. Review of the MIP images confirms the above findings. IMPRESSION: 1. No pulmonary embolus. 2. Slight interval increase in size of a 7 mm (from 4 mm) left lower lobe subpleural nodule (compared to 10/03/2019). Recommend follow-up CT in 3-6 months to evaluate stability. 3. Slight interval increase in size of bilateral axillary lymphadenopathy. Correlate with mammography. Electronically Signed   By: Iven Finn M.D.   On: 01/31/2021 04:19    ED COURSE and MDM  Nursing notes, initial and subsequent vitals signs, including pulse oximetry, reviewed and interpreted by myself.  Vitals:   01/31/21 0012 01/31/21 0028 01/31/21 0212 01/31/21 0300  BP: 125/86 121/71 108/75 130/73  Pulse: 99 93 82 88  Resp: 16 20 18 13   Temp: 98.4 F (36.9 C)     TempSrc: Oral     SpO2: 97% 98% 98% 97%  Weight:      Height:       Medications  iohexol (OMNIPAQUE) 350 MG/ML injection 100 mL (100 mLs Intravenous Contrast Given 01/31/21 0342)    Patient's age-adjusted D-dimer is still elevated.  Given her history of PE we will obtain a CT angio chest.  4:23 AM No evidence of PE.  Patient advised of slightly enlarged subpleural nodule and recommendation to have it followed up in several months.  Patient's dizziness/palpitations could have been due to a transient arrhythmia such  as A. fib with RVR or SVT.  She has had no arrhythmias while in the ED.  PROCEDURES  Procedures   ED DIAGNOSES  ICD-10-CM   1. Dizziness  R42   2. Palpitations  R00.2        Zeeshan Korte, Jenny Reichmann, MD 01/31/21 [phone]

## 2021-02-16 DIAGNOSIS — I10 Essential (primary) hypertension: Secondary | ICD-10-CM | POA: Diagnosis not present

## 2021-02-16 DIAGNOSIS — Z0000 Encounter for general adult medical examination without abnormal findings: Secondary | ICD-10-CM | POA: Diagnosis not present

## 2021-02-16 DIAGNOSIS — Z23 Encounter for immunization: Secondary | ICD-10-CM | POA: Diagnosis not present

## 2021-02-16 DIAGNOSIS — Z86711 Personal history of pulmonary embolism: Secondary | ICD-10-CM | POA: Diagnosis not present

## 2021-02-16 DIAGNOSIS — R911 Solitary pulmonary nodule: Secondary | ICD-10-CM | POA: Diagnosis not present

## 2021-02-16 DIAGNOSIS — Z1231 Encounter for screening mammogram for malignant neoplasm of breast: Secondary | ICD-10-CM | POA: Diagnosis not present

## 2021-02-16 DIAGNOSIS — J4541 Moderate persistent asthma with (acute) exacerbation: Secondary | ICD-10-CM | POA: Diagnosis not present

## 2021-02-16 DIAGNOSIS — Z13228 Encounter for screening for other metabolic disorders: Secondary | ICD-10-CM | POA: Diagnosis not present

## 2021-02-16 DIAGNOSIS — Z8639 Personal history of other endocrine, nutritional and metabolic disease: Secondary | ICD-10-CM | POA: Diagnosis not present

## 2021-02-16 DIAGNOSIS — D500 Iron deficiency anemia secondary to blood loss (chronic): Secondary | ICD-10-CM | POA: Diagnosis not present

## 2021-04-13 ENCOUNTER — Other Ambulatory Visit: Payer: Self-pay | Admitting: Physician Assistant

## 2021-04-13 DIAGNOSIS — M545 Low back pain, unspecified: Secondary | ICD-10-CM

## 2021-04-13 DIAGNOSIS — R269 Unspecified abnormalities of gait and mobility: Secondary | ICD-10-CM

## 2021-04-13 DIAGNOSIS — S8012XA Contusion of left lower leg, initial encounter: Secondary | ICD-10-CM

## 2021-04-13 DIAGNOSIS — M79605 Pain in left leg: Secondary | ICD-10-CM

## 2021-04-13 DIAGNOSIS — S82145S Nondisplaced bicondylar fracture of left tibia, sequela: Secondary | ICD-10-CM

## 2021-04-13 DIAGNOSIS — S8002XA Contusion of left knee, initial encounter: Secondary | ICD-10-CM

## 2021-04-13 NOTE — Progress Notes (Signed)
Pt

## 2022-03-17 ENCOUNTER — Other Ambulatory Visit: Payer: Self-pay

## 2022-03-17 ENCOUNTER — Emergency Department (HOSPITAL_BASED_OUTPATIENT_CLINIC_OR_DEPARTMENT_OTHER)
Admission: EM | Admit: 2022-03-17 | Discharge: 2022-03-17 | Disposition: A | Discharge status: Home / Self Care | Payer: Medicare Other | Attending: Emergency Medicine | Admitting: Emergency Medicine

## 2022-03-17 ENCOUNTER — Emergency Department (HOSPITAL_BASED_OUTPATIENT_CLINIC_OR_DEPARTMENT_OTHER): Payer: Medicare Other | Admitting: Radiology

## 2022-03-17 DIAGNOSIS — W010XXA Fall on same level from slipping, tripping and stumbling without subsequent striking against object, initial encounter: Secondary | ICD-10-CM | POA: Diagnosis not present

## 2022-03-17 DIAGNOSIS — S8992XA Unspecified injury of left lower leg, initial encounter: Secondary | ICD-10-CM | POA: Diagnosis present

## 2022-03-17 DIAGNOSIS — Y939 Activity, unspecified: Secondary | ICD-10-CM | POA: Diagnosis not present

## 2022-03-17 DIAGNOSIS — Y999 Unspecified external cause status: Secondary | ICD-10-CM | POA: Insufficient documentation

## 2022-03-17 DIAGNOSIS — Y929 Unspecified place or not applicable: Secondary | ICD-10-CM | POA: Diagnosis not present

## 2022-03-17 DIAGNOSIS — Z79899 Other long term (current) drug therapy: Secondary | ICD-10-CM | POA: Insufficient documentation

## 2022-03-17 MED ORDER — ACETAMINOPHEN 500 MG PO TABS
1000.0000 mg | ORAL_TABLET | Freq: Four times a day (QID) | ORAL | 0 refills | Status: AC | PRN
Start: 2022-03-17 — End: ?

## 2022-03-17 MED ORDER — CYCLOBENZAPRINE HCL 10 MG PO TABS: 10.0000 mg | ORAL_TABLET | Freq: Two times a day (BID) | ORAL | 0 refills | Status: DC | PRN

## 2022-03-17 MED ORDER — IBUPROFEN 800 MG PO TABS
800.0000 mg | ORAL_TABLET | Freq: Once | ORAL | Status: AC
  Administered 2022-03-17: 800 mg via ORAL
  Filled 2022-03-17: qty 1

## 2022-03-17 NOTE — ED Provider Notes (Signed)
?[facility] ?Provider Note ? ? ?CSN: [id number] ?Arrival date & time: 03/17/22  1708 ? ?  ? ?History ? ?Chief Complaint  ?Patient presents with  ? Fall  ? Knee Pain  ?  Left Knee  ? ? ?Kristen Swanson is a 46 y.o. female. ? ?The history is provided by the patient and medical records. No language interpreter was used.  ?Fall ? ?Knee Pain ? ?46 year old female with prior history of PE on Eliquis, has left total knee replacement, presenting today with complaints of knee pain.  Patient reports she fell 5 days ago and injured her left knee.  She mentions she slipped on wet floor, twisted her left knee and fell directly on it.  She denies hitting her head or loss of consciousness.  She was having a difficult time getting up and bear weight.  She report pain mostly to the medial aspect of knee.  She also noted swelling.  She does not complain of hip or ankle pain.  She has been using over-the-counter medication without adequate relief.  Her orthopedist is Raliegh Ip. ? ?Home Medications ?Prior to Admission medications   ?Medication Sig Start Date End Date Taking? Authorizing Provider  ?albuterol (PROVENTIL) (5 MG/ML) 0.5% nebulizer solution Take 0.5 mLs (2.5 mg total) by nebulization every 6 (six) hours as needed for up to 30 doses for wheezing or shortness of breath. 12/07/19   Janeece Fitting, PA-C  ?amphetamine-dextroamphetamine (ADDERALL XR) 15 MG 24 hr capsule Take 15 mg by mouth daily.     [provider]  ?apixaban (ELIQUIS) 5 MG TABS tablet Take 1 tablet (5 mg total) by mouth 2 (two) times daily. 04/16/18   Ledell Noss, MD  ?ferrous sulfate 325 (65 FE) MG tablet Take 1 tablet (325 mg total) by mouth daily. 10/03/19   Isla Pence, MD  ?hydrochlorothiazide (HYDRODIURIL) 25 MG tablet Take 1 tablet (25 mg total) by mouth daily. 08/25/17   Ledell Noss, MD  ?metroNIDAZOLE (METROGEL) 0.75 % vaginal gel Place 1 Applicatorful vaginally daily. 12/22/19   Luvenia Redden, PA-C  ?ondansetron  (ZOFRAN ODT) 4 MG disintegrating tablet Take 1 tablet (4 mg total) by mouth every 8 (eight) hours as needed for nausea or vomiting. 02/26/19   Henderly, Britni A, PA-C  ?potassium chloride (KLOR-CON) 10 MEQ tablet Take 1 tablet (10 mEq total) by mouth daily. 10/03/19   Isla Pence, MD  ?traMADol (ULTRAM) 50 MG tablet TAKE 1 TABLET BY MOUTH EVERY 6 HOURS AS NEEDED FOR MODERATE PAIN OR SEVERE PAIN 08/28/20   Anyanwu, Sallyanne Havers, MD  ?dicyclomine (BENTYL) 20 MG tablet Take 1 tablet (20 mg total) by mouth 2 (two) times daily. 02/26/19 01/31/21  Henderly, Britni A, PA-C  ?   ? ?Allergies    ?Patient has no known allergies.   ? ?Review of Systems   ?Review of Systems  ?All other systems reviewed and are negative. ? ?Physical Exam ?Updated Vital Signs ?BP 116/73 (BP Location: Right Arm)   Pulse 95   Temp 98 ?F (36.7 ?C)   Resp 20   LMP 12/27/2019   SpO2 100%  ?Physical Exam ?Vitals and nursing note reviewed.  ?Constitutional:   ?   General: She is not in acute distress. ?   Appearance: She is well-developed. She is obese.  ?HENT:  ?   Head: Atraumatic.  ?Eyes:  ?   Conjunctiva/sclera: Conjunctivae normal.  ?Pulmonary:  ?   Effort: Pulmonary effort is normal.  ?Musculoskeletal:     ?  General: Tenderness (Left knee: Tenderness to medial joint line on palpation without any obvious edema or deformity or redness noted.  Decreased range of motion secondary to pain.) present.  ?   Cervical back: Neck supple.  ?   Comments: Left hip and left ankle nontender  ?Skin: ?   Capillary Refill: Capillary refill takes less than 2 seconds.  ?   Findings: No rash.  ?Neurological:  ?   Mental Status: She is alert.  ?Psychiatric:     ?   Mood and Affect: Mood normal.  ? ? ?ED Results / Procedures / Treatments   ?Labs ?(all labs ordered are listed, but only abnormal results are displayed) ?Labs Reviewed - No data to display ? ?EKG ?None ? ?Radiology ?DG Knee Complete 4 Views Left ? ?Result Date: 03/17/2022 ?CLINICAL DATA:  Left knee pain.   Patient reports fall 5 days ago. EXAM: LEFT KNEE - COMPLETE 4+ VIEW COMPARISON:  Radiograph 11/09/2017 FINDINGS: No acute fracture. No dislocation. Stable postoperative appearance of the proximal tibia and distal femur. Intact hardware. Similar heterotopic calcification adjacent to the lateral proximal tibia and fibular head. Chronic lateral tibial plateau irregularity. Trace knee joint effusion. IMPRESSION: 1. No acute fracture or dislocation. 2. Stable chronic and postsurgical change without hardware complication. Electronically Signed   By: Keith Rake M.D.   On: 03/17/2022 19:24   ? ?Procedures ?Procedures  ? ? ?Medications Ordered in ED ?Medications  ?ibuprofen (ADVIL) tablet 800 mg (has no administration in time range)  ? ? ?ED Course/ Medical Decision Making/ A&P ?  ?                        ?Medical Decision Making ?Amount and/or Complexity of Data Reviewed ?Radiology: ordered. ? ? ?BP 116/73 (BP Location: Right Arm)   Pulse 95   Temp 98 ?F (36.7 ?C)   Resp 20   LMP 12/27/2019   SpO2 100%  ? ?8:50 PM ?This is a 46 year old female presenting with complaints of left knee injury from a fall.  She has had multiple left knee surgeries with hardware previously.  Her orthopedist is Raliegh Ip.  She had a mechanical fall and struck her knee.  Since then she is having difficulty ambulating and bearing weight.  No pain to left hip or left ankle.  X-ray of the left knee independently viewed and interpreted by me showing no acute fracture or dislocation.  No hardware complication.  I discussed this finding with patient.  X-ray cannot rule out ligament injury or internal derangement therefore a knee immobilizer was applied and patient was provided with crutches.  She will need to follow-up closely with orthopedist for outpatient evaluation if symptoms persist.  Patient voiced understanding and agrees with plan.  RICE therapy discussed. ? ? ? ? ? ? ? ?Final Clinical Impression(s) / ED Diagnoses ?Final  diagnoses:  ?Left knee injury, initial encounter  ? ? ?Rx / DC Orders ?ED Discharge Orders   ? ?      Ordered  ?  cyclobenzaprine (FLEXERIL) 10 MG tablet  2 times daily PRN       ? 03/17/22 2054  ?  acetaminophen (TYLENOL) 500 MG tablet  Every 6 hours PRN       ? 03/17/22 2054  ? ?  ?  ? ?  ? ? ?  ?Domenic Moras, PA-C ?03/17/22 2057 ? ?  ?Fredia Sorrow, MD ?03/20/22 [phone] ? ?

## 2022-03-17 NOTE — Discharge Instructions (Addendum)
You have been evaluated for your knee injury.  Fortunately x-ray did not show any broken bone or dislocation or loosening of the hardware.  However, you may have had ligamental injury or meniscal injury from the impact which may not show up on x-ray.  If you still have persistent pain despite using knee immobilizer and crutches, please consider follow-up with your orthopedist for further evaluation.  Take Tylenol and Flexeril as needed for pain. ?

## 2022-03-17 NOTE — ED Triage Notes (Addendum)
Patient arrives via POV with complaints of left knee pain due to a fall 5 days ago. Patient states that she tripped in water, falling onto her left knee. Patient reports swelling initially that has decreased. Rates pain an 8.5/10, worsens with ambulation.  ? ? ?Patient reports several surgeries in the same knee, with metal parts implanted.  ?

## 2022-03-31 ENCOUNTER — Emergency Department (HOSPITAL_BASED_OUTPATIENT_CLINIC_OR_DEPARTMENT_OTHER): Payer: Medicare Other

## 2022-03-31 ENCOUNTER — Encounter (HOSPITAL_BASED_OUTPATIENT_CLINIC_OR_DEPARTMENT_OTHER): Payer: Self-pay | Admitting: *Deleted

## 2022-03-31 ENCOUNTER — Emergency Department (HOSPITAL_BASED_OUTPATIENT_CLINIC_OR_DEPARTMENT_OTHER)
Admission: EM | Admit: 2022-03-31 | Discharge: 2022-04-01 | Disposition: A | Discharge status: Home / Self Care | Payer: Medicare Other | Attending: Emergency Medicine | Admitting: Emergency Medicine

## 2022-03-31 ENCOUNTER — Other Ambulatory Visit: Payer: Self-pay

## 2022-03-31 ENCOUNTER — Emergency Department (HOSPITAL_BASED_OUTPATIENT_CLINIC_OR_DEPARTMENT_OTHER): Payer: Medicare Other | Admitting: Radiology

## 2022-03-31 DIAGNOSIS — Z7901 Long term (current) use of anticoagulants: Secondary | ICD-10-CM | POA: Diagnosis not present

## 2022-03-31 DIAGNOSIS — R051 Acute cough: Secondary | ICD-10-CM | POA: Diagnosis not present

## 2022-03-31 DIAGNOSIS — J45909 Unspecified asthma, uncomplicated: Secondary | ICD-10-CM | POA: Diagnosis not present

## 2022-03-31 DIAGNOSIS — Z79899 Other long term (current) drug therapy: Secondary | ICD-10-CM | POA: Insufficient documentation

## 2022-03-31 DIAGNOSIS — E876 Hypokalemia: Secondary | ICD-10-CM | POA: Insufficient documentation

## 2022-03-31 DIAGNOSIS — Z20822 Contact with and (suspected) exposure to covid-19: Secondary | ICD-10-CM | POA: Insufficient documentation

## 2022-03-31 DIAGNOSIS — R059 Cough, unspecified: Secondary | ICD-10-CM | POA: Diagnosis present

## 2022-03-31 DIAGNOSIS — I10 Essential (primary) hypertension: Secondary | ICD-10-CM | POA: Diagnosis not present

## 2022-03-31 LAB — CBC WITH DIFFERENTIAL/PLATELET
Abs Immature Granulocytes: 0.01 10*3/uL (ref 0.00–0.07)
Basophils Absolute: 0 10*3/uL (ref 0.0–0.1)
Basophils Relative: 1 %
Eosinophils Absolute: 0.3 10*3/uL (ref 0.0–0.5)
Eosinophils Relative: 7 %
HCT: 39 % (ref 36.0–46.0)
Hemoglobin: 12.6 g/dL (ref 12.0–15.0)
Immature Granulocytes: 0 %
Lymphocytes Relative: 39 %
Lymphs Abs: 1.8 10*3/uL (ref 0.7–4.0)
MCH: 28.3 pg (ref 26.0–34.0)
MCHC: 32.3 g/dL (ref 30.0–36.0)
MCV: 87.4 fL (ref 80.0–100.0)
Monocytes Absolute: 0.4 10*3/uL (ref 0.1–1.0)
Monocytes Relative: 9 %
Neutro Abs: 2.1 10*3/uL (ref 1.7–7.7)
Neutrophils Relative %: 44 %
Platelets: 316 10*3/uL (ref 150–400)
RBC: 4.46 MIL/uL (ref 3.87–5.11)
RDW: 13.2 % (ref 11.5–15.5)
WBC: 4.7 10*3/uL (ref 4.0–10.5)
nRBC: 0 % (ref 0.0–0.2)

## 2022-03-31 LAB — BASIC METABOLIC PANEL
Anion gap: 9 (ref 5–15)
BUN: 7 mg/dL (ref 6–20)
CO2: 24 mmol/L (ref 22–32)
Calcium: 9.9 mg/dL (ref 8.9–10.3)
Chloride: 105 mmol/L (ref 98–111)
Creatinine, Ser: 0.64 mg/dL (ref 0.44–1.00)
GFR, Estimated: >60 mL/min (ref >60)
Glucose, Bld: 93 mg/dL (ref 70–99)
Potassium: 3.2 mmol/L — ABNORMAL LOW (ref 3.5–5.1)
Sodium: 138 mmol/L (ref 135–145)

## 2022-03-31 LAB — D-DIMER, QUANTITATIVE: D-Dimer, Quant: 0.83 ug/mL-FEU — ABNORMAL HIGH (ref 0.00–0.50)

## 2022-03-31 LAB — RESP PANEL BY RT-PCR (FLU A&B, COVID) ARPGX2
Influenza A by PCR: NEGATIVE
Influenza B by PCR: NEGATIVE
SARS Coronavirus 2 by RT PCR: NEGATIVE

## 2022-03-31 LAB — PREGNANCY, URINE: Preg Test, Ur: NEGATIVE

## 2022-03-31 MED ORDER — POTASSIUM CHLORIDE CRYS ER 20 MEQ PO TBCR
40.0000 meq | EXTENDED_RELEASE_TABLET | Freq: Once | ORAL | Status: AC
  Administered 2022-03-31: 40 meq via ORAL
  Filled 2022-03-31: qty 2

## 2022-03-31 MED ORDER — ALBUTEROL SULFATE HFA 108 (90 BASE) MCG/ACT IN AERS: 1.0000 | INHALATION_SPRAY | Freq: Four times a day (QID) | RESPIRATORY_TRACT | 0 refills | Status: AC | PRN

## 2022-03-31 MED ORDER — IOHEXOL 350 MG/ML SOLN
100.0000 mL | Freq: Once | INTRAVENOUS | Status: AC | PRN
  Administered 2022-03-31: 100 mL via INTRAVENOUS

## 2022-03-31 NOTE — ED Provider Notes (Signed)
?[facility] ?Provider Note ? ? ?CSN: [id number] ?Arrival date & time: 03/31/22  1954 ? ?  ? ?History ? ?Chief Complaint  ?Patient presents with  ? Cough  ? ? ?Kristen Swanson is a 46 y.o. 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  Patient reports to ED for evaluation of cough.  Patient states that for the last 1 week she has had cough that has been dry initially but recently has been more productive.  Patient reports that this cough is worse at night and dissipates throughout the day.  Patient states that she is also having one-sided leg swelling, she has a history of blood clots and is currently on Eliquis.  The patient reports that she is compliant on this and has not missed any doses.  Patient denies any known sick contacts.  Patient denies being vaccinated for COVID however she is vaccinated for the flu.  Patient endorsing cough, unilateral leg swelling.  Patient denies any nausea, vomiting, fevers, body aches or chills. ? ? ?Cough ?Associated symptoms: no chills, no fever and no shortness of breath   ? ?  ? ?Home Medications ?Prior to Admission medications   ?Medication Sig Start Date End Date Taking? Authorizing Provider  ?albuterol (VENTOLIN HFA) 108 (90 Base) MCG/ACT inhaler Inhale 1-2 puffs into the lungs every 6 (six) hours as needed for wheezing or shortness of breath. 03/31/22  Yes Azucena Cecil, PA-C  ?acetaminophen (TYLENOL) 500 MG tablet Take 2 tablets (1,000 mg total) by mouth every 6 (six) hours as needed for moderate pain. 03/17/22   Domenic Moras, PA-C  ?amphetamine-dextroamphetamine (ADDERALL XR) 15 MG 24 hr capsule Take 15 mg by mouth daily.     [provider]  ?apixaban (ELIQUIS) 5 MG TABS tablet Take 1 tablet (5 mg total) by mouth 2 (two) times daily. 04/16/18   Ledell Noss, MD  ?cyclobenzaprine (FLEXERIL) 10 MG tablet Take 1 tablet (10 mg total) by mouth 2 (two) times daily as  needed for muscle spasms. 03/17/22   Domenic Moras, PA-C  ?ferrous sulfate 325 (65 FE) MG tablet Take 1 tablet (325 mg total) by mouth daily. 10/03/19   Isla Pence, MD  ?hydrochlorothiazide (HYDRODIURIL) 25 MG tablet Take 1 tablet (25 mg total) by mouth daily. 08/25/17   Ledell Noss, MD  ?metroNIDAZOLE (METROGEL) 0.75 % vaginal gel Place 1 Applicatorful vaginally daily. 12/22/19   Luvenia Redden, PA-C  ?ondansetron (ZOFRAN ODT) 4 MG disintegrating tablet Take 1 tablet (4 mg total) by mouth every 8 (eight) hours as needed for nausea or vomiting. 02/26/19   Henderly, Britni A, PA-C  ?potassium chloride (KLOR-CON) 10 MEQ tablet Take 1 tablet (10 mEq total) by mouth daily. 10/03/19   Isla Pence, MD  ?traMADol (ULTRAM) 50 MG tablet TAKE 1 TABLET BY MOUTH EVERY 6 HOURS AS NEEDED FOR MODERATE PAIN OR SEVERE PAIN 08/28/20   Anyanwu, Sallyanne Havers, MD  ?dicyclomine (BENTYL) 20 MG tablet Take 1 tablet (20 mg total) by mouth 2 (two) times daily. 02/26/19 01/31/21  Henderly, Britni A, PA-C  ?   ? ?Allergies    ?Patient has no known allergies.   ? ?Review of Systems   ?Review of Systems  ?Constitutional:  Negative for chills and fever.  ?Respiratory:  Positive for cough. Negative for shortness of breath.   ?Cardiovascular:  Positive for leg swelling.  ?Gastrointestinal:  Negative for nausea and vomiting.  ?All other systems reviewed and are  negative. ? ?Physical Exam ?Updated Vital Signs ?BP 116/89 (BP Location: Right Arm)   Pulse 90   Temp 98.9 ?F (37.2 ?C) (Oral)   Resp 16   Wt 122.5 kg   LMP 12/27/2019   SpO2 98%   BMI 38.74 kg/m?  ?Physical Exam ?Vitals and nursing note reviewed.  ?Constitutional:   ?   General: She is not in acute distress. ?   Appearance: Normal appearance. She is not ill-appearing, toxic-appearing or diaphoretic.  ?HENT:  ?   Head: Normocephalic and atraumatic.  ?   Nose: Nose normal. No congestion.  ?   Mouth/Throat:  ?   Mouth: Mucous membranes are moist.  ?   Pharynx: Oropharynx is clear. No  oropharyngeal exudate or posterior oropharyngeal erythema.  ?Eyes:  ?   Extraocular Movements: Extraocular movements intact.  ?   Conjunctiva/sclera: Conjunctivae normal.  ?   Pupils: Pupils are equal, round, and reactive to light.  ?Cardiovascular:  ?   Rate and Rhythm: Normal rate and regular rhythm.  ?Pulmonary:  ?   Effort: Pulmonary effort is normal.  ?   Breath sounds: No wheezing.  ?Abdominal:  ?   General: Abdomen is flat. Bowel sounds are normal.  ?   Palpations: Abdomen is soft.  ?   Tenderness: There is no abdominal tenderness.  ?Musculoskeletal:  ?   Cervical back: Normal range of motion and neck supple. No tenderness.  ?Skin: ?   General: Skin is warm and dry.  ?   Capillary Refill: Capillary refill takes less than 2 seconds.  ?Neurological:  ?   Mental Status: She is alert and oriented to person, place, and time.  ? ? ?ED Results / Procedures / Treatments   ?Labs ?(all labs ordered are listed, but only abnormal results are displayed) ?Labs Reviewed  ?BASIC METABOLIC PANEL - Abnormal; Notable for the following components:  ?    Result Value  ? Potassium 3.2 (*)   ? All other components within normal limits  ?RESP PANEL BY RT-PCR (FLU A&B, COVID) ARPGX2  ?CBC WITH DIFFERENTIAL/PLATELET  ?PREGNANCY, URINE  ?D-DIMER, QUANTITATIVE  ? ? ?EKG ?None ? ?Radiology ?No results found. ? ?Procedures ?Procedures  ? ?Medications Ordered in ED ?Medications  ?potassium chloride SA (KLOR-CON M) CR tablet 40 mEq (has no administration in time range)  ? ? ?ED Course/ Medical Decision Making/ A&P ?  ?                        ?Medical Decision Making ?Amount and/or Complexity of Data Reviewed ?Labs: ordered. ? ? ?46 year old female presents ED for evaluation of cough.  Please see HPI for further details. ? ?On examination, patient afebrile, nontachycardic, nonhypoxic.  Patient lung sounds show no signs of wheezing, Rales but patient does have snoring respirations on examination.  When asked the patient to take intentional  big deep breath, snoring respirations subsided.  Patient denies any known fevers recently.  Patient is anticoagulated on Eliquis due to recent history of blood clots.  Patient denies missing any doses.  Patient also endorsing one-sided leg swelling.  I ordered ultrasound study to rule out a DVT however ultrasound technician is no longer here.  D-dimer order was placed instead. ? ?Patient worked up utilizing the following labs and imaging studies interpreted by me personally: ?- CBC unremarkable ?- BMP shows decreased potassium of 3.2.  I have ordered this patient 40 mEq of potassium chloride ?- D-dimer pending ?- Respiratory panel pending ?-  Chest x-ray pending ? ?At time my end of shift, the patient's work-up at night been completed.  This patient has been signed out to my attending Dr. Tamera Punt for further disposition and management.  Patient stable at time of handoff. ? ? ?Final Clinical Impression(s) / ED Diagnoses ?Final diagnoses:  ?Acute cough  ? ? ?Rx / DC Orders ?ED Discharge Orders   ? ?      Ordered  ?  albuterol (VENTOLIN HFA) 108 (90 Base) MCG/ACT inhaler  Every 6 hours PRN       ? 03/31/22 2203  ? ?  ?  ? ?  ? ? ?  ?Azucena Cecil, PA-C ?03/31/22 2205 ? ?  ?Malvin Johns, MD ?03/31/22 2312 ? ?  ?Malvin Johns, MD ?03/31/22 2323 ? ?

## 2022-03-31 NOTE — ED Triage Notes (Signed)
Pt arrives POV with family members for cough x1 week.  Pt reports it began as a dry cough but has become more productive.  Pt reports coughing is worst during the night.  ?

## 2022-04-01 DIAGNOSIS — R051 Acute cough: Secondary | ICD-10-CM | POA: Diagnosis not present

## 2022-04-01 MED ORDER — DEXAMETHASONE 4 MG PO TABS
10.0000 mg | ORAL_TABLET | Freq: Once | ORAL | Status: AC
  Administered 2022-04-01: 10 mg via ORAL
  Filled 2022-04-01: qty 3

## 2022-04-01 NOTE — ED Provider Notes (Signed)
Patient signed out pending CTA.  CTA negative for acute pulmonary embolism.  No pneumonia.  Discussed this with the patient.  She was given a dose of Decadron to cover for asthma.  She will be discharged with an inhaler.  Follow-up with primary physician recommend ?Physical Exam  ?BP 117/71 (BP Location: Right Arm)   Pulse 79   Temp 98.2 ?F (36.8 ?C) (Oral)   Resp 14   Wt 122.5 kg   LMP 12/27/2019   SpO2 96%   BMI 38.74 kg/m?  ? ?Physical Exam ?Awake, alert, no acute distress ?No respiratory distress noted ?Procedures  ?Procedures ? ?ED Course / MDM  ?  ?Medical Decision Making ?Amount and/or Complexity of Data Reviewed ?Labs: ordered. ?Radiology: ordered. ? ?Risk ?Prescription drug management. ? ? ?Problem List Items Addressed This Visit   ?None ?Visit Diagnoses   ? ? Acute cough    -  Primary  ? ?  ? ? ? ? ? ? ?  ?Merryl Hacker, MD ?04/01/22 [phone] ? ?

## 2022-05-11 ENCOUNTER — Emergency Department (HOSPITAL_BASED_OUTPATIENT_CLINIC_OR_DEPARTMENT_OTHER)
Admission: EM | Admit: 2022-05-11 | Discharge: 2022-05-11 | Disposition: A | Discharge status: Home / Self Care | Payer: Medicare Other | Attending: Emergency Medicine | Admitting: Emergency Medicine

## 2022-05-11 ENCOUNTER — Encounter (HOSPITAL_BASED_OUTPATIENT_CLINIC_OR_DEPARTMENT_OTHER): Payer: Self-pay

## 2022-05-11 DIAGNOSIS — M79602 Pain in left arm: Secondary | ICD-10-CM | POA: Insufficient documentation

## 2022-05-11 DIAGNOSIS — M79601 Pain in right arm: Secondary | ICD-10-CM | POA: Insufficient documentation

## 2022-05-11 DIAGNOSIS — Z7901 Long term (current) use of anticoagulants: Secondary | ICD-10-CM | POA: Insufficient documentation

## 2022-05-11 DIAGNOSIS — I10 Essential (primary) hypertension: Secondary | ICD-10-CM | POA: Diagnosis not present

## 2022-05-11 LAB — CBC
HCT: 37.6 % (ref 36.0–46.0)
Hemoglobin: 12.2 g/dL (ref 12.0–15.0)
MCH: 28.2 pg (ref 26.0–34.0)
MCHC: 32.4 g/dL (ref 30.0–36.0)
MCV: 87 fL (ref 80.0–100.0)
Platelets: 273 10*3/uL (ref 150–400)
RBC: 4.32 MIL/uL (ref 3.87–5.11)
RDW: 13.5 % (ref 11.5–15.5)
WBC: 5.2 10*3/uL (ref 4.0–10.5)
nRBC: 0 % (ref 0.0–0.2)

## 2022-05-11 LAB — BASIC METABOLIC PANEL
Anion gap: 8 (ref 5–15)
BUN: 9 mg/dL (ref 6–20)
CO2: 22 mmol/L (ref 22–32)
Calcium: 9.6 mg/dL (ref 8.9–10.3)
Chloride: 108 mmol/L (ref 98–111)
Creatinine, Ser: 0.64 mg/dL (ref 0.44–1.00)
GFR, Estimated: >60 mL/min (ref >60)
Glucose, Bld: 109 mg/dL — ABNORMAL HIGH (ref 70–99)
Potassium: 3.3 mmol/L — ABNORMAL LOW (ref 3.5–5.1)
Sodium: 138 mmol/L (ref 135–145)

## 2022-05-11 NOTE — ED Triage Notes (Signed)
Pt comes via [facility] for bilateral nerve pain in both of her arms. Reports that she was cleaning and scrubbing a carpet.  ?

## 2022-05-11 NOTE — ED Provider Notes (Signed)
?[facility] EM[facility] DEPT ?Provider Note ? ? ?CSN: [id number] ?Arrival date & time: 05/11/22  0356 ? ?  ? ?History ? ?Chief Complaint  ?Patient presents with  ? Arm Pain  ? ? ?Kristen Swanson is a 46 y.o. female. ? ?Patient is a 46 year old female with past medical history of hypertension, pulmonary emboli on Eliquis.  Patient presenting today with complaints of bilateral arm pain.  She reports cleaning her home all afternoon using ammonia mixed with water.  This evening as she was trying to sleep, she describes pain in her arms that became severe.  She took a tramadol prior to coming here and pain seems to be improving.  She denies any numbness or tingling.  She denies any neck pain. ? ?The history is provided by the patient.  ? ?  ? ?Home Medications ?Prior to Admission medications   ?Medication Sig Start Date End Date Taking? Authorizing Provider  ?acetaminophen (TYLENOL) 500 MG tablet Take 2 tablets (1,000 mg total) by mouth every 6 (six) hours as needed for moderate pain. 03/17/22   Domenic Moras, PA-C  ?albuterol (VENTOLIN HFA) 108 (90 Base) MCG/ACT inhaler Inhale 1-2 puffs into the lungs every 6 (six) hours as needed for wheezing or shortness of breath. 03/31/22   Azucena Cecil, PA-C  ?amphetamine-dextroamphetamine (ADDERALL XR) 15 MG 24 hr capsule Take 15 mg by mouth daily.     [provider]  ?apixaban (ELIQUIS) 5 MG TABS tablet Take 1 tablet (5 mg total) by mouth 2 (two) times daily. 04/16/18   Ledell Noss, MD  ?cyclobenzaprine (FLEXERIL) 10 MG tablet Take 1 tablet (10 mg total) by mouth 2 (two) times daily as needed for muscle spasms. 03/17/22   Domenic Moras, PA-C  ?ferrous sulfate 325 (65 FE) MG tablet Take 1 tablet (325 mg total) by mouth daily. 10/03/19   Isla Pence, MD  ?hydrochlorothiazide (HYDRODIURIL) 25 MG tablet Take 1 tablet (25 mg total) by mouth daily. 08/25/17   Ledell Noss, MD  ?metroNIDAZOLE (METROGEL) 0.75 % vaginal gel Place 1 Applicatorful vaginally daily. 12/22/19    Luvenia Redden, PA-C  ?ondansetron (ZOFRAN ODT) 4 MG disintegrating tablet Take 1 tablet (4 mg total) by mouth every 8 (eight) hours as needed for nausea or vomiting. 02/26/19   Henderly, Britni A, PA-C  ?potassium chloride (KLOR-CON) 10 MEQ tablet Take 1 tablet (10 mEq total) by mouth daily. 10/03/19   Isla Pence, MD  ?traMADol (ULTRAM) 50 MG tablet TAKE 1 TABLET BY MOUTH EVERY 6 HOURS AS NEEDED FOR MODERATE PAIN OR SEVERE PAIN 08/28/20   Anyanwu, Sallyanne Havers, MD  ?dicyclomine (BENTYL) 20 MG tablet Take 1 tablet (20 mg total) by mouth 2 (two) times daily. 02/26/19 01/31/21  Henderly, Britni A, PA-C  ?   ? ?Allergies    ?Patient has no known allergies.   ? ?Review of Systems   ?Review of Systems  ?All other systems reviewed and are negative. ? ?Physical Exam ?Updated Vital Signs ?BP (!) 173/94   Pulse 87   Temp 98.2 ?F (36.8 ?C) (Oral)   Resp 20   LMP 12/27/2019   SpO2 100%  ?Physical Exam ?Vitals and nursing note reviewed.  ?Constitutional:   ?   General: She is not in acute distress. ?   Appearance: She is well-developed. She is not diaphoretic.  ?HENT:  ?   Head: Normocephalic and atraumatic.  ?Cardiovascular:  ?   Rate and Rhythm: Normal rate and regular rhythm.  ?   Heart sounds:  No murmur heard. ?  No friction rub. No gallop.  ?Pulmonary:  ?   Effort: Pulmonary effort is normal. No respiratory distress.  ?   Breath sounds: Normal breath sounds. No wheezing.  ?Abdominal:  ?   General: Bowel sounds are normal. There is no distension.  ?   Palpations: Abdomen is soft.  ?   Tenderness: There is no abdominal tenderness.  ?Musculoskeletal:     ?   General: Normal range of motion.  ?   Cervical back: Normal range of motion and neck supple.  ?Skin: ?   General: Skin is warm and dry.  ?Neurological:  ?   General: No focal deficit present.  ?   Mental Status: She is alert and oriented to person, place, and time.  ? ? ?ED Results / Procedures / Treatments   ?Labs ?(all labs ordered are listed, but only abnormal results  are displayed) ?Labs Reviewed  ?BASIC METABOLIC PANEL - Abnormal; Notable for the following components:  ?    Result Value  ? Potassium 3.3 (*)   ? Glucose, Bld 109 (*)   ? All other components within normal limits  ?CBC  ? ? ?EKG ?None ? ?Radiology ?No results found. ? ?Procedures ?Procedures  ? ? ?Medications Ordered in ED ?Medications - No data to display ? ?ED Course/ Medical Decision Making/ A&P ? ?Patient presenting here with complaints of bilateral arm pain that began after cleaning her house with an ammonia water mixture.  I am uncertain as to the etiology of this discomfort, however it seems to be improving.  I suspect some sort of overuse from repetitive movements during the cleaning process she described. ? ?I see nothing on her exam that is of concern.  She has full range of motion and neurologically intact upper and lower extremities.  I feel at this point patient can safely be discharged. ? ?Final Clinical Impression(s) / ED Diagnoses ?Final diagnoses:  ?None  ? ? ?Rx / DC Orders ?ED Discharge Orders   ? ? None  ? ?  ? ? ?  ?Veryl Speak, MD ?05/11/22 8250 ? ?

## 2022-05-11 NOTE — Discharge Instructions (Signed)
Continue taking tramadol as prescribed as needed for pain. ? ?Rest. ? ?Follow-up with primary doctor if symptoms are not improving in the next few days, and return to the ER if symptoms significantly worsen or change. ?

## 2022-12-11 ENCOUNTER — Emergency Department (HOSPITAL_COMMUNITY): Payer: Medicare Other

## 2022-12-11 ENCOUNTER — Emergency Department (HOSPITAL_COMMUNITY)
Admission: EM | Admit: 2022-12-11 | Discharge: 2022-12-11 | Discharge status: Against Medical Advice | Payer: Medicare Other | Source: Home / Self Care

## 2022-12-11 ENCOUNTER — Emergency Department (HOSPITAL_BASED_OUTPATIENT_CLINIC_OR_DEPARTMENT_OTHER): Payer: Medicare Other

## 2022-12-11 ENCOUNTER — Inpatient Hospital Stay (HOSPITAL_BASED_OUTPATIENT_CLINIC_OR_DEPARTMENT_OTHER)
Admission: EM | Admit: 2022-12-11 | Discharge: 2022-12-14 | DRG: 176 | Disposition: A | Discharge status: Home Health | Payer: Medicare Other | Attending: Internal Medicine | Admitting: Internal Medicine

## 2022-12-11 ENCOUNTER — Other Ambulatory Visit: Payer: Self-pay

## 2022-12-11 ENCOUNTER — Emergency Department (HOSPITAL_BASED_OUTPATIENT_CLINIC_OR_DEPARTMENT_OTHER): Payer: Medicare Other | Admitting: Radiology

## 2022-12-11 ENCOUNTER — Encounter (HOSPITAL_COMMUNITY): Payer: Self-pay | Admitting: Emergency Medicine

## 2022-12-11 ENCOUNTER — Encounter (HOSPITAL_BASED_OUTPATIENT_CLINIC_OR_DEPARTMENT_OTHER): Payer: Self-pay | Admitting: Emergency Medicine

## 2022-12-11 DIAGNOSIS — Z833 Family history of diabetes mellitus: Secondary | ICD-10-CM

## 2022-12-11 DIAGNOSIS — F909 Attention-deficit hyperactivity disorder, unspecified type: Secondary | ICD-10-CM | POA: Diagnosis present

## 2022-12-11 DIAGNOSIS — E876 Hypokalemia: Secondary | ICD-10-CM | POA: Diagnosis present

## 2022-12-11 DIAGNOSIS — N946 Dysmenorrhea, unspecified: Secondary | ICD-10-CM

## 2022-12-11 DIAGNOSIS — M79604 Pain in right leg: Principal | ICD-10-CM

## 2022-12-11 DIAGNOSIS — R6510 Systemic inflammatory response syndrome (SIRS) of non-infectious origin without acute organ dysfunction: Secondary | ICD-10-CM | POA: Diagnosis present

## 2022-12-11 DIAGNOSIS — Z6839 Body mass index (BMI) 39.0-39.9, adult: Secondary | ICD-10-CM

## 2022-12-11 DIAGNOSIS — F4323 Adjustment disorder with mixed anxiety and depressed mood: Secondary | ICD-10-CM | POA: Diagnosis present

## 2022-12-11 DIAGNOSIS — Z96652 Presence of left artificial knee joint: Secondary | ICD-10-CM | POA: Diagnosis present

## 2022-12-11 DIAGNOSIS — Z79899 Other long term (current) drug therapy: Secondary | ICD-10-CM

## 2022-12-11 DIAGNOSIS — M7120 Synovial cyst of popliteal space [Baker], unspecified knee: Secondary | ICD-10-CM | POA: Insufficient documentation

## 2022-12-11 DIAGNOSIS — Z1152 Encounter for screening for COVID-19: Secondary | ICD-10-CM

## 2022-12-11 DIAGNOSIS — Z823 Family history of stroke: Secondary | ICD-10-CM

## 2022-12-11 DIAGNOSIS — E871 Hypo-osmolality and hyponatremia: Secondary | ICD-10-CM | POA: Diagnosis not present

## 2022-12-11 DIAGNOSIS — R2689 Other abnormalities of gait and mobility: Secondary | ICD-10-CM | POA: Diagnosis present

## 2022-12-11 DIAGNOSIS — R519 Headache, unspecified: Secondary | ICD-10-CM | POA: Diagnosis present

## 2022-12-11 DIAGNOSIS — Z86718 Personal history of other venous thrombosis and embolism: Secondary | ICD-10-CM

## 2022-12-11 DIAGNOSIS — R509 Fever, unspecified: Secondary | ICD-10-CM | POA: Insufficient documentation

## 2022-12-11 DIAGNOSIS — M7121 Synovial cyst of popliteal space [Baker], right knee: Secondary | ICD-10-CM | POA: Diagnosis present

## 2022-12-11 DIAGNOSIS — D219 Benign neoplasm of connective and other soft tissue, unspecified: Secondary | ICD-10-CM

## 2022-12-11 DIAGNOSIS — I2699 Other pulmonary embolism without acute cor pulmonale: Secondary | ICD-10-CM | POA: Diagnosis present

## 2022-12-11 DIAGNOSIS — R2241 Localized swelling, mass and lump, right lower limb: Secondary | ICD-10-CM | POA: Insufficient documentation

## 2022-12-11 DIAGNOSIS — I2693 Single subsegmental pulmonary embolism without acute cor pulmonale: Principal | ICD-10-CM | POA: Diagnosis present

## 2022-12-11 DIAGNOSIS — Z7901 Long term (current) use of anticoagulants: Secondary | ICD-10-CM

## 2022-12-11 DIAGNOSIS — R252 Cramp and spasm: Secondary | ICD-10-CM | POA: Insufficient documentation

## 2022-12-11 DIAGNOSIS — R531 Weakness: Secondary | ICD-10-CM | POA: Insufficient documentation

## 2022-12-11 DIAGNOSIS — M791 Myalgia, unspecified site: Secondary | ICD-10-CM | POA: Insufficient documentation

## 2022-12-11 DIAGNOSIS — Z5321 Procedure and treatment not carried out due to patient leaving prior to being seen by health care provider: Secondary | ICD-10-CM | POA: Insufficient documentation

## 2022-12-11 DIAGNOSIS — R0789 Other chest pain: Secondary | ICD-10-CM | POA: Insufficient documentation

## 2022-12-11 DIAGNOSIS — Z8249 Family history of ischemic heart disease and other diseases of the circulatory system: Secondary | ICD-10-CM

## 2022-12-11 DIAGNOSIS — Z91148 Patient's other noncompliance with medication regimen for other reason: Secondary | ICD-10-CM

## 2022-12-11 DIAGNOSIS — I10 Essential (primary) hypertension: Secondary | ICD-10-CM | POA: Diagnosis present

## 2022-12-11 DIAGNOSIS — D539 Nutritional anemia, unspecified: Secondary | ICD-10-CM | POA: Diagnosis present

## 2022-12-11 DIAGNOSIS — R0602 Shortness of breath: Secondary | ICD-10-CM | POA: Diagnosis not present

## 2022-12-11 DIAGNOSIS — R110 Nausea: Secondary | ICD-10-CM | POA: Insufficient documentation

## 2022-12-11 DIAGNOSIS — D509 Iron deficiency anemia, unspecified: Secondary | ICD-10-CM | POA: Diagnosis present

## 2022-12-11 LAB — BASIC METABOLIC PANEL
Anion gap: 9 (ref 5–15)
BUN: 5 mg/dL — ABNORMAL LOW (ref 6–20)
CO2: 24 mmol/L (ref 22–32)
Calcium: 9.6 mg/dL (ref 8.9–10.3)
Chloride: 102 mmol/L (ref 98–111)
Creatinine, Ser: 0.7 mg/dL (ref 0.44–1.00)
GFR, Estimated: >60 mL/min (ref >60)
Glucose, Bld: 118 mg/dL — ABNORMAL HIGH (ref 70–99)
Potassium: 3 mmol/L — ABNORMAL LOW (ref 3.5–5.1)
Sodium: 135 mmol/L (ref 135–145)

## 2022-12-11 LAB — CBC
HCT: 37.1 % (ref 36.0–46.0)
Hemoglobin: 12.1 g/dL (ref 12.0–15.0)
MCH: 28.5 pg (ref 26.0–34.0)
MCHC: 32.6 g/dL (ref 30.0–36.0)
MCV: 87.3 fL (ref 80.0–100.0)
Platelets: UNDETERMINED 10*3/uL (ref 150–400)
RBC: 4.25 MIL/uL (ref 3.87–5.11)
RDW: 13.3 % (ref 11.5–15.5)
WBC: 5.5 10*3/uL (ref 4.0–10.5)
nRBC: 0 % (ref 0.0–0.2)

## 2022-12-11 LAB — RESP PANEL BY RT-PCR (RSV, FLU A&B, COVID)  RVPGX2
Influenza A by PCR: NEGATIVE
Influenza B by PCR: NEGATIVE
Resp Syncytial Virus by PCR: NEGATIVE
SARS Coronavirus 2 by RT PCR: NEGATIVE

## 2022-12-11 LAB — PREGNANCY, URINE: Preg Test, Ur: NEGATIVE

## 2022-12-11 LAB — BRAIN NATRIURETIC PEPTIDE: B Natriuretic Peptide: 47.1 pg/mL (ref 0.0–100.0)

## 2022-12-11 LAB — TROPONIN I (HIGH SENSITIVITY)
Troponin I (High Sensitivity): 5 ng/L (ref <18)
Troponin I (High Sensitivity): 5 ng/L (ref <18)

## 2022-12-11 MED ORDER — HEPARIN (PORCINE) 25000 UT/250ML-% IV SOLN
1700.0000 [IU]/h | INTRAVENOUS | Status: AC
  Administered 2022-12-11 – 2022-12-12 (×3): 1650 [IU]/h via INTRAVENOUS
  Administered 2022-12-13: 1700 [IU]/h via INTRAVENOUS
  Filled 2022-12-11 (×5): qty 250

## 2022-12-11 MED ORDER — IOHEXOL 350 MG/ML SOLN
100.0000 mL | Freq: Once | INTRAVENOUS | Status: AC | PRN
  Administered 2022-12-11: 100 mL via INTRAVENOUS

## 2022-12-11 MED ORDER — MORPHINE SULFATE (PF) 2 MG/ML IV SOLN
2.0000 mg | Freq: Once | INTRAVENOUS | Status: AC | PRN
  Administered 2022-12-12: 2 mg via INTRAVENOUS
  Filled 2022-12-11: qty 1

## 2022-12-11 MED ORDER — HEPARIN BOLUS VIA INFUSION
6500.0000 [IU] | Freq: Once | INTRAVENOUS | Status: AC
  Administered 2022-12-11: 6500 [IU] via INTRAVENOUS

## 2022-12-11 MED ORDER — MORPHINE SULFATE (PF) 4 MG/ML IV SOLN
4.0000 mg | Freq: Once | INTRAVENOUS | Status: AC
  Administered 2022-12-11: 4 mg via INTRAVENOUS
  Filled 2022-12-11: qty 1

## 2022-12-11 MED ORDER — POTASSIUM CHLORIDE 20 MEQ PO PACK
40.0000 meq | PACK | Freq: Once | ORAL | Status: AC
  Administered 2022-12-11: 40 meq via ORAL
  Filled 2022-12-11: qty 2

## 2022-12-11 MED ORDER — DIPHENHYDRAMINE HCL 50 MG/ML IJ SOLN
25.0000 mg | Freq: Once | INTRAMUSCULAR | Status: AC
  Administered 2022-12-11: 25 mg via INTRAVENOUS
  Filled 2022-12-11: qty 1

## 2022-12-11 MED ORDER — METOCLOPRAMIDE HCL 5 MG/ML IJ SOLN
10.0000 mg | Freq: Once | INTRAMUSCULAR | Status: AC
  Administered 2022-12-11: 10 mg via INTRAVENOUS
  Filled 2022-12-11: qty 2

## 2022-12-11 MED ORDER — ACETAMINOPHEN 500 MG PO TABS
1000.0000 mg | ORAL_TABLET | Freq: Once | ORAL | Status: DC
Start: 2022-12-11 — End: 2022-12-11

## 2022-12-11 MED ORDER — SODIUM CHLORIDE 0.9 % IV BOLUS
500.0000 mL | Freq: Once | INTRAVENOUS | Status: AC
  Administered 2022-12-11: 500 mL via INTRAVENOUS

## 2022-12-11 NOTE — ED Provider Triage Note (Signed)
Emergency Medicine Provider Triage Evaluation Note  Kristen Swanson , a 46 y.o. female  was evaluated in triage.  Pt complains of right leg cramping and swelling, intermittent left sided chest pain for 3 days, weakness, and "whole body pain".  She also reports nausea.  Patient is not taking her blood thinner and has hx of PE.  Patient states she is not taking her blood thinner because "Jesus healed me and I no longer need that medicine.  Denies shortness of breath, vomiting, diarrhea, syncope.  Review of Systems  Positive: See above Negative: See above  Physical Exam  BP (!) 168/94   Pulse (!) 106   Temp (!) 101.3 F (38.5 C) (Oral)   Resp 18   Ht '5\' 10"'$  (1.778 m)   Wt 126 kg   LMP 12/27/2019   SpO2 100%   BMI 39.86 kg/m  Gen:   Awake, no distress   Resp:  Normal effort  MSK:   Moves extremities without difficulty  Other:  Right calf swollen, firm, and warm to the touch  Medical Decision Making  Medically screening exam initiated at 12:33 PM.  Appropriate orders placed.  Kristen Swanson was informed that the remainder of the evaluation will be completed by another provider, this initial triage assessment does not replace that evaluation, and the importance of remaining in the ED until their evaluation is complete.     Theressa Stamps R, [place] 12/11/22 1235

## 2022-12-11 NOTE — Progress Notes (Signed)
This a 46 year old female, came over with past medical history of PE.  Previously on Eliquis which patient discontinued because she did not like how it made her feel.  Presented to [facility] ER with right leg and chest pain.  Imaging positive for pulmonary embolism, ultrasound negative for DVT.  Patient given morphine for extremity pain and started on a heparin drip.  Oxygen sats 88 to 93% on 2 L oxygen.  Heart rates low 100s.  Patient hypokalemic, potassium repleted.  Patient with a Tmax of 101, no evidence of infection.

## 2022-12-11 NOTE — Progress Notes (Signed)
ANTICOAGULATION CONSULT NOTE - Initial Consult  Pharmacy Consult for Heparin infusion Indication: pulmonary embolus  No Known Allergies  Patient Measurements: Height: '5\' 10"'$  (177.8 cm) Weight: 126 kg (277 lb 12.5 oz) IBW/kg (Calculated) : 68.5 Heparin Dosing Weight: 97.7 kg  Vital Signs: Temp: 98.9 F (37.2 C) (12/13 1630) Temp Source: Oral (12/13 1630) BP: 151/96 (12/13 2130) Pulse Rate: 102 (12/13 2130)  Labs: Recent Labs    12/11/22 1530 12/11/22 1723  HGB  --  12.1  HCT  --  37.1  PLT  --  PLATELET CLUMPS NOTED ON SMEAR, UNABLE TO ESTIMATE  CREATININE 0.70  --   TROPONINIHS 5 5    Estimated Creatinine Clearance: 126.9 mL/min (by C-G formula based on SCr of 0.7 mg/dL).   Medical History: Past Medical History:  Diagnosis Date   ADHD (attention deficit hyperactivity disorder)    Adjustment disorder with mixed anxiety and depressed mood    Asthma    Fibroid tumor    Hypertension    Kidney stones    Microcytic anemia    Pulmonary emboli (HCC)     Medications:  Scheduled:   Assessment: 46 yo F presents to ED with R leg cramping and intermittent L-sided chest pain x3 days. Pt has history of PE from 02/2017. Pt reports taking apixaban '5mg'$  BID, but discontinued it ~1 year ago without direction from physician. CT PE positive for subsegmental RUL PE. Pharmacy consulted to dose heparin infusion for PE.   Hgb 12.1, Plt clumped on collection  Goal of Therapy:  Heparin level 0.3-0.7 units/ml Monitor platelets by anticoagulation protocol: Yes   Plan:  Give heparin 6500 units IV bolus x1, then  Start heparin infusion at 1650 units/hr Check heparin level in 6 hours Monitor daily CBC, heparin level, and for s/sx of bleeding  Luisa Hart, PharmD, BCPS Clinical Pharmacist 12/11/2022 10:33 PM   Please refer to [facility] for pharmacy phone number

## 2022-12-11 NOTE — ED Provider Notes (Signed)
[facility] EMERGENCY DEPT Provider Note   CSN: [id number] Arrival date & time: 12/11/22  1336     History PE not anticoagulated Chief Complaint  Patient presents with   Leg Pain   pe rule out    Kristen Swanson is a 46 y.o. female.  46 y.o female with a PMH of PE not anticoagulated presents to the ED with a chief complaint of right calf pain which began 2 days ago.  Patient reports a sharp throbbing sensation to her right calf, which hurts throughout the night.  Exacerbated with any kind of ambulation, standing, movement.  She describes this as a "charley horse day which is never quits.  She also reports starting to feel some chest pain this morning, did take an aspirin to help with her symptoms without much improvement.  She does endorse a fever at home with a Tmax of 101, has not been taking any medication for the symptoms.  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  Denies any trauma, no cough, nausea or vomiting.   The history is provided by the patient and medical records.  Leg Pain Associated symptoms: fever   Associated symptoms: no back pain        Home Medications Prior to Admission medications   Medication Sig Start Date End Date Taking? Authorizing Provider  acetaminophen (TYLENOL) 500 MG tablet Take 2 tablets (1,000 mg total) by mouth every 6 (six) hours as needed for moderate pain. 03/17/22   Domenic Moras, PA-C  albuterol (VENTOLIN HFA) 108 (90 Base) MCG/ACT inhaler Inhale 1-2 puffs into the lungs every 6 (six) hours as needed for wheezing or shortness of breath. 03/31/22   Azucena Cecil, PA-C  amphetamine-dextroamphetamine (ADDERALL XR) 15 MG 24 hr capsule Take 15 mg by mouth daily.     [provider]  apixaban (ELIQUIS) 5 MG TABS tablet Take 1 tablet (5 mg total) by mouth 2 (two) times daily. 04/16/18    Ledell Noss, MD  cyclobenzaprine (FLEXERIL) 10 MG tablet Take 1 tablet (10 mg total) by mouth 2 (two) times daily as needed for muscle spasms. 03/17/22   Domenic Moras, PA-C  ferrous sulfate 325 (65 FE) MG tablet Take 1 tablet (325 mg total) by mouth daily. 10/03/19   Isla Pence, MD  hydrochlorothiazide (HYDRODIURIL) 25 MG tablet Take 1 tablet (25 mg total) by mouth daily. 08/25/17   Ledell Noss, MD  metroNIDAZOLE (METROGEL) 0.75 % vaginal gel Place 1 Applicatorful vaginally daily. 12/22/19   Luvenia Redden, PA-C  ondansetron (ZOFRAN ODT) 4 MG disintegrating tablet Take 1 tablet (4 mg total) by mouth every 8 (eight) hours as needed for nausea or vomiting. 02/26/19   Henderly, Britni A, PA-C  potassium chloride (KLOR-CON) 10 MEQ tablet Take 1 tablet (10 mEq total) by mouth daily. 10/03/19   Isla Pence, MD  traMADol (ULTRAM) 50 MG tablet TAKE 1 TABLET BY MOUTH EVERY 6 HOURS AS NEEDED FOR MODERATE PAIN OR SEVERE PAIN 08/28/20   Anyanwu, Sallyanne Havers, MD  dicyclomine (BENTYL) 20 MG tablet Take 1 tablet (20 mg total) by mouth 2 (two) times daily. 02/26/19 01/31/21  Henderly, Britni A, PA-C      Allergies    Patient has no known allergies.    Review of Systems   Review of Systems  Constitutional:  Positive for fever. Negative  for chills.  HENT:  Negative for sore throat.   Respiratory:  Positive for shortness of breath.   Cardiovascular:  Positive for chest pain and leg swelling.  Gastrointestinal:  Positive for nausea. Negative for abdominal pain and vomiting.  Genitourinary:  Negative for flank pain.  Musculoskeletal:  Positive for myalgias. Negative for back pain.  Skin:  Negative for pallor and wound.  Neurological:  Positive for headaches.  All other systems reviewed and are negative.   Physical Exam Updated Vital Signs BP (!) 144/82 (BP Location: Right Arm)   Pulse (!) 101   Temp 98.9 F (37.2 C) (Oral)   Resp (!) 25   LMP 12/27/2019   SpO2 100%  Physical Exam Vitals and nursing note  reviewed.  Constitutional:      Appearance: Normal appearance. She is obese. She is ill-appearing.  HENT:     Head: Normocephalic and atraumatic.     Nose: Nose normal.     Mouth/Throat:     Mouth: Mucous membranes are moist.  Eyes:     Pupils: Pupils are equal, round, and reactive to light.  Cardiovascular:     Rate and Rhythm: Tachycardia present.     Pulses:          Dorsalis pedis pulses are 2+ on the right side and 2+ on the left side.     Comments: Significant RIGHT calf tenderness.no pitting edema bilaterally  Pulmonary:     Effort: Pulmonary effort is normal.     Comments: Lungs are difficult to auscultate due to body habitus. Abdominal:     General: Abdomen is flat.     Palpations: Abdomen is soft.     Tenderness: There is no abdominal tenderness.  Musculoskeletal:     Cervical back: Normal range of motion and neck supple.  Neurological:     Mental Status: She is alert.     ED Results / Procedures / Treatments   Labs (all labs ordered are listed, but only abnormal results are displayed) Labs Reviewed  BASIC METABOLIC PANEL - Abnormal; Notable for the following components:      Result Value   Potassium 3.0 (*)    Glucose, Bld 118 (*)    BUN 5 (*)    All other components within normal limits  RESP PANEL BY RT-PCR (RSV, FLU A&B, COVID)  RVPGX2  PREGNANCY, URINE  CBC  BRAIN NATRIURETIC PEPTIDE  TROPONIN I (HIGH SENSITIVITY)  TROPONIN I (HIGH SENSITIVITY)    EKG None  Radiology US Venous Img Lower Unilateral Right  Result Date: 12/11/2022 CLINICAL DATA:  Right lower extremity edema. EXAM: Right LOWER EXTREMITY VENOUS DOPPLER ULTRASOUND TECHNIQUE: Gray-scale sonography with compression, as well as color and duplex ultrasound, were performed to evaluate the deep venous system(s) from the level of the common femoral vein through the popliteal and proximal calf veins. COMPARISON:  None Available. FINDINGS: VENOUS Normal compressibility of the common femoral,  superficial femoral, and popliteal veins, as well as the visualized calf veins. Visualized portions of profunda femoral vein and great saphenous vein unremarkable. No filling defects to suggest DVT on grayscale or color Doppler imaging. Doppler waveforms show normal direction of venous flow, normal respiratory plasticity and response to augmentation. Limited views of the contralateral common femoral vein are unremarkable. OTHER Probable Baker's cyst seen in right popliteal fossa. Limitations: none IMPRESSION: No definite evidence of deep venous thrombosis seen in right lower extremity. Electronically Signed   By: Marijo Conception M.D.   On: 12/11/2022 18:04  CT Angio Chest PE W and/or Wo Contrast  Result Date: 12/11/2022 CLINICAL DATA:  Pulmonary embolism (PE) suspected, high prob Chest pain. History of pulmonary embolus. EXAM: CT ANGIOGRAPHY CHEST WITH CONTRAST TECHNIQUE: Multidetector CT imaging of the chest was performed using the standard protocol during bolus administration of intravenous contrast. Multiplanar CT image reconstructions and MIPs were obtained to evaluate the vascular anatomy. RADIATION DOSE REDUCTION: This exam was performed according to the departmental dose-optimization program which includes automated exposure control, adjustment of the mA and/or kV according to patient size and/or use of iterative reconstruction technique. CONTRAST:  160m OMNIPAQUE IOHEXOL 350 MG/ML SOLN COMPARISON:  Radiograph earlier today. Most recent chest CTA 03/31/2022 FINDINGS: Cardiovascular: Contrast bolus timing is suboptimal. Isolated subsegmental filling defect in the right upper lobe pulmonary artery series 4, image 38-39. The thoracic aorta is normal in caliber. No acute aortic findings. Heart is upper normal in size. No pericardial effusion. Mediastinum/Nodes: No enlarged mediastinal or hilar lymph nodes. Mild bilateral axillary and subpectoral adenopathy is chronic and has been present on prior exams.  Small hiatal hernia. Lungs/Pleura: Possible right apical small pulmonary infarct with subpleural ground-glass opacities series 6, image 22. Dependent subsegmental atelectasis in the lower lobes. No acute or focal airspace disease. No pleural fluid. Upper Abdomen: No acute findings. Musculoskeletal: There are no acute or suspicious osseous abnormalities. Review of the MIP images confirms the above findings. IMPRESSION: 1. Isolated subsegmental right upper lobe pulmonary embolus. 2. Possible small right apical pulmonary infarct with subpleural ground-glass opacities in the right upper lobe. Critical Value/emergent results were called by telephone at the time of interpretation on 12/11/2022 at 5:17 pm to provider [facility], who verbally acknowledged these results. Electronically Signed   By: MKeith RakeM.D.   On: 12/11/2022 17:17   DG Chest 2 View  Result Date: 12/11/2022 CLINICAL DATA:  Chest pain. EXAM: CHEST - 2 VIEW COMPARISON:  Chest x-ray April 2, 23. FINDINGS: The heart size and mediastinal contours are within normal limits. Both lungs are clear. No visible pleural effusions or pneumothorax. No acute osseous abnormality. IMPRESSION: No active cardiopulmonary disease. Electronically Signed   By: FMargaretha SheffieldM.D.   On: 12/11/2022 15:19    Procedures .Critical Care  Performed by: SJaneece Fitting PA-C Authorized by: SJaneece Fitting PA-C   Critical care provider statement:    Critical care time (minutes):  45   Critical care start time:  12/11/2022 8:45 AM   Critical care end time:  12/11/2022 9:36 PM   Critical care was necessary to treat or prevent imminent or life-threatening deterioration of the following conditions:  Circulatory failure   Critical care was time spent personally by me on the following activities:  Discussions with primary provider, evaluation of patient's response to treatment and examination of patient   Care discussed with: admitting provider       Medications  Ordered in ED Medications  morphine (PF) 2 MG/ML injection 2 mg (has no administration in time range)  diphenhydrAMINE (BENADRYL) injection 25 mg (25 mg Intravenous Given 12/11/22 1715)  metoCLOPramide (REGLAN) injection 10 mg (10 mg Intravenous Given 12/11/22 1716)  sodium chloride 0.9 % bolus 500 mL (0 mLs Intravenous Stopped 12/11/22 1814)  iohexol (OMNIPAQUE) 350 MG/ML injection 100 mL (100 mLs Intravenous Contrast Given 12/11/22 1644)  potassium chloride (KLOR-CON) packet 40 mEq (40 mEq Oral Given 12/11/22 1940)  morphine (PF) 4 MG/ML injection 4 mg (4 mg Intravenous Given 12/11/22 2115)    ED Course/ Medical Decision Making/  A&P Clinical Course as of 12/11/22 2146  Wed Dec 11, 2022  1732 4 PE.  [CC]  1858 PE, low PESI class I Likely initiation on Xarelto and DC [CC]    Clinical Course User Index [CC] Tretha Sciara, MD                           Medical Decision Making Amount and/or Complexity of Data Reviewed Labs: ordered. Radiology: ordered.  Risk Prescription drug management. Decision regarding hospitalization.     This patient presents to the ED for concern of right leg pain, this involves a number of treatment options, and is a complaint that carries with it a high risk of complications and morbidity.  The differential diagnosis includes DVT, rhabdo versus CHD exacerbation.    Co morbidities: Discussed in HPI   Brief History:  Patient with a prior history of pulmonary embolism presents to the ED with a chief complaint of right leg pain has been ongoing for the past 2 days.  He not anticoagulated and taking this medication 1 year ago as she reports she did not like how it made her feel.  Also endorses a fever of 101 while at home, significant body aches throughout.  Denies any upper respiratory symptoms.  Significant tenderness along the right calf, but no improvement despite aspirin as she was taking this due to ongoing chest pain.  EMR reviewed including pt  PMHx, past surgical history and past visits to ER.   See HPI for more details   Lab Tests:  I ordered and independently interpreted labs.  The pertinent results include:    I personally reviewed all laboratory work and imaging. Metabolic panel without any acute abnormality specifically kidney function within normal limits and no significant electrolyte abnormalities. CBC without leukocytosis or significant anemia. First troponin is negative.  Pregnancy test also negative.  Respiratory panel negative for influenza, COVID-19.   Imaging Studies:  CT Angio chest showed: IMPRESSION:  1. Isolated subsegmental right upper lobe pulmonary embolus.  2. Possible small right apical pulmonary infarct with subpleural  ground-glass opacities in the right upper lobe.    Critical Value/emergent results were called by telephone at the time  of interpretation on 12/11/2022 at 5:17 pm to provider [facility] ,  who verbally acknowledged these results.   Chest x-ray without any acute finding.  Cardiac Monitoring:  The patient was maintained on a cardiac monitor.  I personally viewed and interpreted the cardiac monitored which showed an underlying rhythm of: NSR EKG non-ischemic  Medicines ordered:  I ordered medication including bolus, reglan, benadryl  for headache cocktail Reevaluation of the patient after these medicines showed that the patient improved I have reviewed the patients home medicines and have made adjustments as needed  Reevaluation:  After the interventions noted above I re-evaluated patient and found that they have :stayed the same   Social Determinants of Health:  The patient's social determinants of health were a factor in the care of this patient    Problem List / ED Course:  Patient here with right leg pain which has been ongoing for a couple of days.  She reports this feels like a charley horse has been ongoing to her right lower leg.  She has not been running any  fevers, did not have any trauma.  She also endorses chest pain that began suddenly yesterday, she felt some tightness therefore she took some aspirin which did not help with the  pain in her leg.  Patient is a prior history of pulmonary embolism and was taking Eliquis however she stopped this medication approximately several years ago as she did not like the way it made her feel.  She arrived in the ED tachycardic, was originally Primary evaluation patient appears uncomfortable, vitals are remarkable for low oxygen saturations in the 88, placed on 2 L via nasal cannula.  Heart rate is elevated in the 1 teens.  She does some tachypnea however lungs are clear to auscultation without any wheezing or rales.  Evaluation of her blood work interpreted by me CBC with no leukocytosis, hemoglobin is unremarkable.  BMP with some mild hypokalemia, she was given oral potassium to help with this.  Respiratory panel negative for influenza, COVID-19.  BNP is negative.  Troponin x 2 are also negative.  Pregnancy test is also negative.  Ultrasound of her right leg did not show any DVT.  However a Baker's cyst is noted right where patient has pain.  No streaking in the skin, no erythema to suggest cellulitis at this time.  Chest x-ray without any pneumonia.  CT angio chest that showed subsegmental pulmonary embolism, patient was started on heparin. Patient is accompanied by both of her daughters at the bedside, she is in a good amount of pain for her right Baker's cyst, we tried ambulating her however patient needs extensive assistance in order to move around.  I do not feel that patient would be appropriate for discharge at this time.  PESI score is high with hypoxia and tachycardia present.  I discussed this case with my attending, who is agreeable to plan and treatment at this time.  I do feel the patient meets criteria for admission for further management of her pulmonary embolism.  Patient is agreeable to plan and treatment at  this time    Dispostion:  9:04 PM After consideration of the diagnostic results and the patients response to treatment, I feel that the patent would benefit from admission to [facility] to Dr. Claria Dice who will admit patient for further management.     Portions of this note were generated with [employer]. Dictation errors may occur despite best attempts at proofreading.   Final Clinical Impression(s) / ED Diagnoses Final diagnoses:  Right leg pain  Shortness of breath  Single subsegmental pulmonary embolism without acute cor pulmonale [facility])    Rx / DC Orders ED Discharge Orders     None         Janeece Fitting, PA-C 12/11/22 2146    Tretha Sciara, MD 12/14/22 [phone]

## 2022-12-11 NOTE — ED Triage Notes (Signed)
2 days ago had knot in right leg and chest pain for 3 days, similar to when she had pulmonary embolus 2 years and stopped blood thinner about a year ago (eliquis) didn't like the way made her feel.

## 2022-12-11 NOTE — ED Triage Notes (Signed)
Pt arrives via EMS from home with right leg cramping and intermittent left sided chest pain for 3 days. Pt not taking her blood thinner and has hx of PE. Pt states "actually my whole body hurts," states she feels nauseous.

## 2022-12-11 NOTE — ED Notes (Signed)
Pt refused labs and states she is going to another hospital with family

## 2022-12-11 NOTE — ED Notes (Signed)
Pulse Ox check was attempted but due to Pts right knee pain, she was unable to ambulate. Provider aware.

## 2022-12-12 ENCOUNTER — Inpatient Hospital Stay (HOSPITAL_COMMUNITY): Payer: Medicare Other

## 2022-12-12 ENCOUNTER — Emergency Department (HOSPITAL_BASED_OUTPATIENT_CLINIC_OR_DEPARTMENT_OTHER): Payer: Medicare Other

## 2022-12-12 DIAGNOSIS — E871 Hypo-osmolality and hyponatremia: Secondary | ICD-10-CM | POA: Diagnosis not present

## 2022-12-12 DIAGNOSIS — R6510 Systemic inflammatory response syndrome (SIRS) of non-infectious origin without acute organ dysfunction: Secondary | ICD-10-CM | POA: Diagnosis present

## 2022-12-12 DIAGNOSIS — R519 Headache, unspecified: Secondary | ICD-10-CM | POA: Diagnosis present

## 2022-12-12 DIAGNOSIS — R0602 Shortness of breath: Secondary | ICD-10-CM | POA: Diagnosis present

## 2022-12-12 DIAGNOSIS — F4323 Adjustment disorder with mixed anxiety and depressed mood: Secondary | ICD-10-CM | POA: Diagnosis present

## 2022-12-12 DIAGNOSIS — Z1152 Encounter for screening for COVID-19: Secondary | ICD-10-CM | POA: Diagnosis not present

## 2022-12-12 DIAGNOSIS — D539 Nutritional anemia, unspecified: Secondary | ICD-10-CM | POA: Diagnosis present

## 2022-12-12 DIAGNOSIS — I2699 Other pulmonary embolism without acute cor pulmonale: Secondary | ICD-10-CM | POA: Diagnosis not present

## 2022-12-12 DIAGNOSIS — M7120 Synovial cyst of popliteal space [Baker], unspecified knee: Secondary | ICD-10-CM | POA: Insufficient documentation

## 2022-12-12 DIAGNOSIS — D509 Iron deficiency anemia, unspecified: Secondary | ICD-10-CM | POA: Diagnosis present

## 2022-12-12 DIAGNOSIS — Z86718 Personal history of other venous thrombosis and embolism: Secondary | ICD-10-CM | POA: Diagnosis not present

## 2022-12-12 DIAGNOSIS — Z6839 Body mass index (BMI) 39.0-39.9, adult: Secondary | ICD-10-CM | POA: Diagnosis not present

## 2022-12-12 DIAGNOSIS — Z823 Family history of stroke: Secondary | ICD-10-CM | POA: Diagnosis not present

## 2022-12-12 DIAGNOSIS — I10 Essential (primary) hypertension: Secondary | ICD-10-CM | POA: Diagnosis present

## 2022-12-12 DIAGNOSIS — Z96652 Presence of left artificial knee joint: Secondary | ICD-10-CM | POA: Diagnosis present

## 2022-12-12 DIAGNOSIS — I2609 Other pulmonary embolism with acute cor pulmonale: Secondary | ICD-10-CM

## 2022-12-12 DIAGNOSIS — M7121 Synovial cyst of popliteal space [Baker], right knee: Secondary | ICD-10-CM | POA: Diagnosis present

## 2022-12-12 DIAGNOSIS — E876 Hypokalemia: Secondary | ICD-10-CM | POA: Diagnosis present

## 2022-12-12 DIAGNOSIS — M79604 Pain in right leg: Secondary | ICD-10-CM | POA: Diagnosis not present

## 2022-12-12 DIAGNOSIS — R2689 Other abnormalities of gait and mobility: Secondary | ICD-10-CM | POA: Diagnosis present

## 2022-12-12 DIAGNOSIS — F909 Attention-deficit hyperactivity disorder, unspecified type: Secondary | ICD-10-CM | POA: Diagnosis present

## 2022-12-12 DIAGNOSIS — Z833 Family history of diabetes mellitus: Secondary | ICD-10-CM | POA: Diagnosis not present

## 2022-12-12 DIAGNOSIS — Z8249 Family history of ischemic heart disease and other diseases of the circulatory system: Secondary | ICD-10-CM | POA: Diagnosis not present

## 2022-12-12 DIAGNOSIS — R509 Fever, unspecified: Secondary | ICD-10-CM | POA: Insufficient documentation

## 2022-12-12 DIAGNOSIS — I2693 Single subsegmental pulmonary embolism without acute cor pulmonale: Secondary | ICD-10-CM | POA: Diagnosis present

## 2022-12-12 DIAGNOSIS — Z79899 Other long term (current) drug therapy: Secondary | ICD-10-CM | POA: Diagnosis not present

## 2022-12-12 DIAGNOSIS — Z7901 Long term (current) use of anticoagulants: Secondary | ICD-10-CM | POA: Diagnosis not present

## 2022-12-12 DIAGNOSIS — Z91148 Patient's other noncompliance with medication regimen for other reason: Secondary | ICD-10-CM | POA: Diagnosis not present

## 2022-12-12 LAB — CBC
HCT: 36.3 % (ref 36.0–46.0)
Hemoglobin: 11.8 g/dL — ABNORMAL LOW (ref 12.0–15.0)
MCH: 28.3 pg (ref 26.0–34.0)
MCHC: 32.5 g/dL (ref 30.0–36.0)
MCV: 87.1 fL (ref 80.0–100.0)
Platelets: 225 10*3/uL (ref 150–400)
RBC: 4.17 MIL/uL (ref 3.87–5.11)
RDW: 13.2 % (ref 11.5–15.5)
WBC: 7.6 10*3/uL (ref 4.0–10.5)
nRBC: 0 % (ref 0.0–0.2)

## 2022-12-12 LAB — ECHOCARDIOGRAM COMPLETE
Height: 70 in
S' Lateral: 3 cm
Weight: 4444.47 oz

## 2022-12-12 LAB — POTASSIUM: Potassium: 3.1 mmol/L — ABNORMAL LOW (ref 3.5–5.1)

## 2022-12-12 LAB — HIV ANTIBODY (ROUTINE TESTING W REFLEX): HIV Screen 4th Generation wRfx: NONREACTIVE

## 2022-12-12 LAB — HEPARIN LEVEL (UNFRACTIONATED): Heparin Unfractionated: 0.5 IU/mL (ref 0.30–0.70)

## 2022-12-12 MED ORDER — CYCLOBENZAPRINE HCL 10 MG PO TABS
10.0000 mg | ORAL_TABLET | Freq: Two times a day (BID) | ORAL | Status: DC | PRN
  Administered 2022-12-12 – 2022-12-13 (×3): 10 mg via ORAL
  Filled 2022-12-12 (×3): qty 1

## 2022-12-12 MED ORDER — SENNOSIDES-DOCUSATE SODIUM 8.6-50 MG PO TABS
1.0000 | ORAL_TABLET | Freq: Every evening | ORAL | Status: DC | PRN
  Administered 2022-12-13: 1 via ORAL
  Filled 2022-12-12: qty 1

## 2022-12-12 MED ORDER — HYDROCHLOROTHIAZIDE 25 MG PO TABS
25.0000 mg | ORAL_TABLET | Freq: Every day | ORAL | Status: DC
  Administered 2022-12-12: 25 mg via ORAL
  Filled 2022-12-12: qty 1

## 2022-12-12 MED ORDER — FERROUS SULFATE 325 (65 FE) MG PO TABS
325.0000 mg | ORAL_TABLET | Freq: Every day | ORAL | Status: DC
  Administered 2022-12-12 – 2022-12-14 (×3): 325 mg via ORAL
  Filled 2022-12-12 (×3): qty 1

## 2022-12-12 MED ORDER — BISACODYL 5 MG PO TBEC
5.0000 mg | DELAYED_RELEASE_TABLET | Freq: Every day | ORAL | Status: DC | PRN
  Administered 2022-12-13: 5 mg via ORAL
  Filled 2022-12-12: qty 1

## 2022-12-12 MED ORDER — METOCLOPRAMIDE HCL 5 MG/ML IJ SOLN
10.0000 mg | Freq: Once | INTRAMUSCULAR | Status: AC
  Administered 2022-12-12: 10 mg via INTRAVENOUS
  Filled 2022-12-12: qty 2

## 2022-12-12 MED ORDER — ACETAMINOPHEN 325 MG PO TABS
650.0000 mg | ORAL_TABLET | Freq: Once | ORAL | Status: AC
  Administered 2022-12-12: 650 mg via ORAL
  Filled 2022-12-12: qty 2

## 2022-12-12 MED ORDER — CAPSAICIN 0.075 % EX CREA
TOPICAL_CREAM | Freq: Two times a day (BID) | CUTANEOUS | Status: DC
  Administered 2022-12-14: 1 via TOPICAL
  Filled 2022-12-12: qty 60

## 2022-12-12 MED ORDER — DIPHENHYDRAMINE HCL 50 MG/ML IJ SOLN
25.0000 mg | Freq: Once | INTRAMUSCULAR | Status: AC
  Administered 2022-12-12: 25 mg via INTRAVENOUS
  Filled 2022-12-12: qty 1

## 2022-12-12 MED ORDER — ONDANSETRON 4 MG PO TBDP
4.0000 mg | ORAL_TABLET | Freq: Three times a day (TID) | ORAL | Status: DC | PRN
  Administered 2022-12-12: 4 mg via ORAL
  Filled 2022-12-12: qty 1

## 2022-12-12 MED ORDER — PREDNISONE 20 MG PO TABS
40.0000 mg | ORAL_TABLET | Freq: Two times a day (BID) | ORAL | Status: DC
  Administered 2022-12-12 – 2022-12-13 (×3): 40 mg via ORAL
  Filled 2022-12-12 (×3): qty 2

## 2022-12-12 MED ORDER — ACETAMINOPHEN 500 MG PO TABS
1000.0000 mg | ORAL_TABLET | Freq: Four times a day (QID) | ORAL | Status: DC | PRN
  Administered 2022-12-12 – 2022-12-13 (×3): 1000 mg via ORAL
  Filled 2022-12-12 (×3): qty 2

## 2022-12-12 MED ORDER — HYDROMORPHONE HCL 1 MG/ML IJ SOLN
0.5000 mg | INTRAMUSCULAR | Status: DC | PRN
  Administered 2022-12-12 (×2): 1 mg via INTRAVENOUS
  Filled 2022-12-12 (×2): qty 1

## 2022-12-12 MED ORDER — TRAMADOL HCL 50 MG PO TABS
50.0000 mg | ORAL_TABLET | Freq: Four times a day (QID) | ORAL | Status: DC | PRN
  Administered 2022-12-12 – 2022-12-14 (×4): 50 mg via ORAL
  Filled 2022-12-12 (×4): qty 1

## 2022-12-12 NOTE — Progress Notes (Signed)
  Echocardiogram 2D Echocardiogram has been performed.  Kristen Swanson M 12/12/2022, 2:09 PM

## 2022-12-12 NOTE — ED Notes (Signed)
Pt to and from [facility] on cardiac monitor with hep drip infusing escorted by this nurse, Rad tech and 2nd staff nurse Ronalee Belts-- pt now back in room resting with eyes closed; RR even and unlabored on RA with symmetrical rise and fall of chest .  Continuous cardiac and pulse ox monitoring maintained.  Hep drip continues to infuse '@16'$ .19m/hr - now infusing to 20G R AC; dressing dry and intact- site free of complications.  Will continue to monitor for acute changes and maintain plan of care

## 2022-12-12 NOTE — ED Notes (Signed)
08:10 Thomas @ [facility] said they short on trucks but we are on list for transport.-ABB(NS)

## 2022-12-12 NOTE — ED Notes (Signed)
Pt awake and alert - GCS 15.  Reporting ongoing RLE pain with persistent HA; pt reports HA cocktail administered earlier and did ease pain however pain 10/10 again.  Dr. Betsey Holiday notified of persistent HA; morphine for pain and CT head now pending.  RR even and unlabored on RA: O2 sats 96%.  Continuous cardiac and pulse ox maintained.  Purewick in place connected to suction.  Hep drip continues to infuse '@16'$ .26m/hr to 20G L FA.

## 2022-12-12 NOTE — H&P (Addendum)
History and Physical    [name] VZD:[id number] DOB: [birth year] DOA: 12/11/2022  PCP: Mancel Bale, PA-C (Confirm with patient/family/NH records and if not entered, this has to be entered at [facility] point of entry) Patient coming from: Home  I have personally briefly reviewed patient's old medical records in [place]  Chief Complaint: Right knee hurts, cannot stand on right leg, left-sided chest pain  HPI: Kristen Swanson is a 46 y.o. female with medical history significant of DVT/PE noncompliant with Eliquis, HTN, chronic iron deficiency anemia, ADHD, morbid obesity, presented with sudden onset of right knee pain, left-sided chest pain for 2 days.  Patient was diagnosed with DVT and PE in 2018, and has been on Eliquis.  3 months ago, patient stopped taking Eliquis on her own and saying that she started to feel stomach sick when taking Eliquis.  2 days ago, she suddenly developed right knee pain swelling and hot to touch, she was limping on right knee but soon the pain became so severe she could not put any weight on the right leg for almost 2 days.  She also run subjective fever but no chills.  Yesterday morning, patient started to have left-sided intermittent sharp pain, worsening with cough or deep breath.   No family history of PE, she has 3 kids and 1 miscarriage.  She reported frequent episodes of small joint swelling in her past but no formal diagnosis of SLE or RA.  ED Course: At [place] ED, patient running low-grade fever Tmax 100.0, borderline tachycardia, blood pressure elevated, O2 saturation 95% on room air.  CT angiogram showed acute isolated right upper lobe segmental PE with apical infarct, DVT study negative but large Baker's cyst identified on her right popliteal. K 4.0  Patient was started on heparin drip and 1 dose of p.o. KCl given.  Also started to have a acute dull headache, frontal located, during the ED stay and been constant, and CT head ordered which was  negative.  Review of Systems: As per HPI otherwise 14 point review of systems negative.    Past Medical History:  Diagnosis Date   ADHD (attention deficit hyperactivity disorder)    Adjustment disorder with mixed anxiety and depressed mood    Asthma    Fibroid tumor    Hypertension    Kidney stones    Microcytic anemia    Pulmonary emboli [facility])     Past Surgical History:  Procedure Laterality Date   LEG SURGERY     REPLACEMENT TOTAL KNEE Left 2008     reports that she has never smoked. She has never used smokeless tobacco. She reports that she does not drink alcohol and does not use drugs.  No Known Allergies  Family History  Problem Relation Age of Onset   Colon cancer Mother 53   Stroke Father    Hypertension Father    Diabetes type I Daughter    Autism Son    Lung cancer Maternal Uncle      Prior to Admission medications   Medication Sig Start Date End Date Taking? Authorizing Provider  acetaminophen (TYLENOL) 500 MG tablet Take 2 tablets (1,000 mg total) by mouth every 6 (six) hours as needed for moderate pain. 03/17/22   Domenic Moras, PA-C  albuterol (VENTOLIN HFA) 108 (90 Base) MCG/ACT inhaler Inhale 1-2 puffs into the lungs every 6 (six) hours as needed for wheezing or shortness of breath. 03/31/22   Azucena Cecil, PA-C  amphetamine-dextroamphetamine (ADDERALL XR) 15  MG 24 hr capsule Take 15 mg by mouth daily.     [provider]  apixaban (ELIQUIS) 5 MG TABS tablet Take 1 tablet (5 mg total) by mouth 2 (two) times daily. 04/16/18   Ledell Noss, MD  cyclobenzaprine (FLEXERIL) 10 MG tablet Take 1 tablet (10 mg total) by mouth 2 (two) times daily as needed for muscle spasms. 03/17/22   Domenic Moras, PA-C  ferrous sulfate 325 (65 FE) MG tablet Take 1 tablet (325 mg total) by mouth daily. 10/03/19   Isla Pence, MD  hydrochlorothiazide (HYDRODIURIL) 25 MG tablet Take 1 tablet (25 mg total) by mouth daily. 08/25/17   Ledell Noss, MD  metroNIDAZOLE (METROGEL)  0.75 % vaginal gel Place 1 Applicatorful vaginally daily. 12/22/19   Luvenia Redden, PA-C  ondansetron (ZOFRAN ODT) 4 MG disintegrating tablet Take 1 tablet (4 mg total) by mouth every 8 (eight) hours as needed for nausea or vomiting. 02/26/19   Henderly, Britni A, PA-C  potassium chloride (KLOR-CON) 10 MEQ tablet Take 1 tablet (10 mEq total) by mouth daily. 10/03/19   Isla Pence, MD  traMADol (ULTRAM) 50 MG tablet TAKE 1 TABLET BY MOUTH EVERY 6 HOURS AS NEEDED FOR MODERATE PAIN OR SEVERE PAIN 08/28/20   Anyanwu, Sallyanne Havers, MD  dicyclomine (BENTYL) 20 MG tablet Take 1 tablet (20 mg total) by mouth 2 (two) times daily. 02/26/19 01/31/21  Henderly, Britni A, PA-C    Physical Exam: Vitals:   12/12/22 0800 12/12/22 0900 12/12/22 0903 12/12/22 1018  BP: (!) 143/82 (!) 152/76  (!) 158/104  Pulse:    95  Resp: 15 (!) 21  18  Temp:   100 F (37.8 C) 98.5 F (36.9 C)  TempSrc:   Oral Oral  SpO2:  97%  99%  Weight:      Height:        Constitutional: NAD, calm, comfortable Vitals:   12/12/22 0800 12/12/22 0900 12/12/22 0903 12/12/22 1018  BP: (!) 143/82 (!) 152/76  (!) 158/104  Pulse:    95  Resp: 15 (!) 21  18  Temp:   100 F (37.8 C) 98.5 F (36.9 C)  TempSrc:   Oral Oral  SpO2:  97%  99%  Weight:      Height:       Eyes: PERRL, lids and conjunctivae normal ENMT: Mucous membranes are moist. Posterior pharynx clear of any exudate or lesions.Normal dentition.  Neck: normal, supple, no masses, no thyromegaly Respiratory: clear to auscultation bilaterally, no wheezing, no crackles. Normal respiratory effort. No accessory muscle use.  Cardiovascular: Regular rate and rhythm, no murmurs / rubs / gallops. No extremity edema. 2+ pedal pulses. No carotid bruits.  Abdomen: no tenderness, no masses palpated. No hepatosplenomegaly. Bowel sounds positive.  Musculoskeletal: no clubbing / cyanosis.  Severe right knee swelling tenderness and tender to touch, significant reduced of passive ROM with  extremity pain on flexion of right knee Skin: no rashes, lesions, ulcers. No induration Neurologic: CN 2-12 grossly intact. Sensation intact, DTR normal. Strength 5/5 in all 4.  Psychiatric: Normal judgment and insight. Alert and oriented x 3. Normal mood.     Labs on Admission: I have personally reviewed following labs and imaging studies  CBC: Recent Labs  Lab 12/11/22 1723 12/12/22 0524  WBC 5.5 7.6  HGB 12.1 11.8*  HCT 37.1 36.3  MCV 87.3 87.1  PLT PLATELET CLUMPS NOTED ON SMEAR, UNABLE TO ESTIMATE 732   Basic Metabolic Panel: Recent Labs  Lab 12/11/22  1530  NA 135  K 3.0*  CL 102  CO2 24  GLUCOSE 118*  BUN 5*  CREATININE 0.70  CALCIUM 9.6   GFR: Estimated Creatinine Clearance: 126.9 mL/min (by C-G formula based on SCr of 0.7 mg/dL). Liver Function Tests: No results for input(s): "AST", "ALT", "ALKPHOS", "BILITOT", "PROT", "ALBUMIN" in the last 168 hours. No results for input(s): "LIPASE", "AMYLASE" in the last 168 hours. No results for input(s): "AMMONIA" in the last 168 hours. Coagulation Profile: No results for input(s): "INR", "PROTIME" in the last 168 hours. Cardiac Enzymes: No results for input(s): "CKTOTAL", "CKMB", "CKMBINDEX", "TROPONINI" in the last 168 hours. BNP (last 3 results) No results for input(s): "PROBNP" in the last 8760 hours. HbA1C: No results for input(s): "HGBA1C" in the last 72 hours. CBG: No results for input(s): "GLUCAP" in the last 168 hours. Lipid Profile: No results for input(s): "CHOL", "HDL", "LDLCALC", "TRIG", "CHOLHDL", "LDLDIRECT" in the last 72 hours. Thyroid Function Tests: No results for input(s): "TSH", "T4TOTAL", "FREET4", "T3FREE", "THYROIDAB" in the last 72 hours. Anemia Panel: No results for input(s): "VITAMINB12", "FOLATE", "FERRITIN", "TIBC", "IRON", "RETICCTPCT" in the last 72 hours. Urine analysis:    Component Value Date/Time   COLORURINE STRAW (A) 01/31/2021 0058   APPEARANCEUR CLOUDY (A) 01/31/2021 0058    LABSPEC 1.005 01/31/2021 0058   PHURINE 5.5 01/31/2021 0058   GLUCOSEU NEGATIVE 01/31/2021 0058   HGBUR NEGATIVE 01/31/2021 0058   BILIRUBINUR NEGATIVE 01/31/2021 0058   KETONESUR NEGATIVE 01/31/2021 0058   PROTEINUR NEGATIVE 01/31/2021 0058   UROBILINOGEN 0.2 12/19/2008 0127   NITRITE NEGATIVE 01/31/2021 0058   LEUKOCYTESUR NEGATIVE 01/31/2021 0058    Radiological Exams on Admission: CT Head Wo Contrast  Result Date: 12/12/2022 CLINICAL DATA:  Headache EXAM: CT HEAD WITHOUT CONTRAST TECHNIQUE: Contiguous axial images were obtained from the base of the skull through the vertex without intravenous contrast. RADIATION DOSE REDUCTION: This exam was performed according to the departmental dose-optimization program which includes automated exposure control, adjustment of the mA and/or kV according to patient size and/or use of iterative reconstruction technique. COMPARISON:  10/11/2016 FINDINGS: Brain: No evidence of acute infarction, hemorrhage, hydrocephalus, extra-axial collection or mass lesion/mass effect. Vascular: No hyperdense vessel or unexpected calcification. Skull: Normal. Negative for fracture or focal lesion. Sinuses/Orbits: No acute finding. IMPRESSION: Negative head CT. Electronically Signed   By: Jorje Guild M.D.   On: 12/12/2022 04:18   US Venous Img Lower Unilateral Right  Result Date: 12/11/2022 CLINICAL DATA:  Right lower extremity edema. EXAM: Right LOWER EXTREMITY VENOUS DOPPLER ULTRASOUND TECHNIQUE: Gray-scale sonography with compression, as well as color and duplex ultrasound, were performed to evaluate the deep venous system(s) from the level of the common femoral vein through the popliteal and proximal calf veins. COMPARISON:  None Available. FINDINGS: VENOUS Normal compressibility of the common femoral, superficial femoral, and popliteal veins, as well as the visualized calf veins. Visualized portions of profunda femoral vein and great saphenous vein unremarkable.  No filling defects to suggest DVT on grayscale or color Doppler imaging. Doppler waveforms show normal direction of venous flow, normal respiratory plasticity and response to augmentation. Limited views of the contralateral common femoral vein are unremarkable. OTHER Probable Baker's cyst seen in right popliteal fossa. Limitations: none IMPRESSION: No definite evidence of deep venous thrombosis seen in right lower extremity. Electronically Signed   By: Marijo Conception M.D.   On: 12/11/2022 18:04   CT Angio Chest PE W and/or Wo Contrast  Result Date: 12/11/2022 CLINICAL DATA:  Pulmonary embolism (PE) suspected, high prob Chest pain. History of pulmonary embolus. EXAM: CT ANGIOGRAPHY CHEST WITH CONTRAST TECHNIQUE: Multidetector CT imaging of the chest was performed using the standard protocol during bolus administration of intravenous contrast. Multiplanar CT image reconstructions and MIPs were obtained to evaluate the vascular anatomy. RADIATION DOSE REDUCTION: This exam was performed according to the departmental dose-optimization program which includes automated exposure control, adjustment of the mA and/or kV according to patient size and/or use of iterative reconstruction technique. CONTRAST:  161m OMNIPAQUE IOHEXOL 350 MG/ML SOLN COMPARISON:  Radiograph earlier today. Most recent chest CTA 03/31/2022 FINDINGS: Cardiovascular: Contrast bolus timing is suboptimal. Isolated subsegmental filling defect in the right upper lobe pulmonary artery series 4, image 38-39. The thoracic aorta is normal in caliber. No acute aortic findings. Heart is upper normal in size. No pericardial effusion. Mediastinum/Nodes: No enlarged mediastinal or hilar lymph nodes. Mild bilateral axillary and subpectoral adenopathy is chronic and has been present on prior exams. Small hiatal hernia. Lungs/Pleura: Possible right apical small pulmonary infarct with subpleural ground-glass opacities series 6, image 22. Dependent subsegmental  atelectasis in the lower lobes. No acute or focal airspace disease. No pleural fluid. Upper Abdomen: No acute findings. Musculoskeletal: There are no acute or suspicious osseous abnormalities. Review of the MIP images confirms the above findings. IMPRESSION: 1. Isolated subsegmental right upper lobe pulmonary embolus. 2. Possible small right apical pulmonary infarct with subpleural ground-glass opacities in the right upper lobe. Critical Value/emergent results were called by telephone at the time of interpretation on 12/11/2022 at 5:17 pm to [facility], who verbally acknowledged these results. Electronically Signed   By: MKeith RakeM.D.   On: 12/11/2022 17:17   DG Chest 2 View  Result Date: 12/11/2022 CLINICAL DATA:  Chest pain. EXAM: CHEST - 2 VIEW COMPARISON:  Chest x-ray April 2, 23. FINDINGS: The heart size and mediastinal contours are within normal limits. Both lungs are clear. No visible pleural effusions or pneumothorax. No acute osseous abnormality. IMPRESSION: No active cardiopulmonary disease. Electronically Signed   By: FMargaretha SheffieldM.D.   On: 12/11/2022 15:19    EKG: Independently reviewed.  Sinus tachycardia, no acute ST changes.  Assessment/Plan Principal Problem:   Acute pulmonary embolism (HCC) Active Problems:   Pulmonary emboli (HCC)   Baker's cyst   Fever  (please populate well all problems here in Problem List. (For example, if patient is on BP meds at home and you resume or decide to hold them, it is a problem that needs to be her. Same for CAD, COPD, HLD and so on)  Right upper lobe subsegment PE, with right apical lung infarct -Secondary to noncompliant with systemic anticoagulation -On heparin drip, can probably switch to p.o. anticoagulation in 24 hours -Echocardiogram ordered -Discussed with patient regarding outpatient follow-up with hematology for hypercoagulable state workup, and patient agreed  Acute ambulation dysfunction -Secondary to  right popliteal Baker's cyst -NSAIDs contraindicated patient on heparin drip, will try p.o. steroid Prednisone 40 mg BID.  Placed call to orthopedic surgery for possible steroid injection. Postpone starting PO anticoagulation today. -For pain control, after night tramadol and IV Dilaudid -Other supportive care, capsicin cream, ice pack and limb elevation -DVT study negative -PT evaluation.  SIRS -Probably from combined effect of Baker's cyst and right apical lung infarct -Otherwise, no significant active signs of infection.  UA pending. -Hold off antibiotics  Hypokalemia -P.o. replacement, recheck level tomorrow  Acute headache -Symptoms started during ED stay, unsure whether symptoms is  related to morphine injection in the ED.  No history of migraines before, CT head negative for acute findings. -As needed Tylenol for now  Chronic iron deficiency anemia -Hemoglobin 11.8, above her baseline, continue iron supplement  Morbid obesity -BMI=39 -Recommend calorie control.  DVT prophylaxis: Heparin drip Code Status: Full code Family Communication: None at bedside Disposition Plan: Patient is sick with acute ambulation dysfunction, requiring inpatient PT evaluation, and inpatient orthopedic surgery consult for Baker's cyst, expect more than 2 midnight hospital stay Consults called: Orthopedic surgery Admission status: MedSurg admission   Lequita Halt MD [facility] Pager [phone]  12/12/2022, 11:01 AM

## 2022-12-12 NOTE — Plan of Care (Signed)
  Problem: Education: Goal: Knowledge of General Education information will improve Description Including pain rating scale, medication(s)/side effects and non-pharmacologic comfort measures Outcome: Progressing   Problem: Health Behavior/Discharge Planning: Goal: Ability to manage health-related needs will improve Outcome: Progressing   

## 2022-12-12 NOTE — Progress Notes (Signed)
ANTICOAGULATION CONSULT NOTE  Pharmacy Consult for Heparin infusion Indication: pulmonary embolus  No Known Allergies  Patient Measurements: Height: '5\' 10"'$  (177.8 cm) Weight: 126 kg (277 lb 12.5 oz) IBW/kg (Calculated) : 68.5 Heparin Dosing Weight: 97.7 kg  Vital Signs: Temp: 100 F (37.8 C) (12/14 0300) Temp Source: Oral (12/14 0300) BP: 153/82 (12/14 0600) Pulse Rate: 100 (12/14 0600)  Labs: Recent Labs    12/11/22 1530 12/11/22 1723 12/12/22 0524  HGB  --  12.1 11.8*  HCT  --  37.1 36.3  PLT  --  PLATELET CLUMPS NOTED ON SMEAR, UNABLE TO ESTIMATE 225  HEPARINUNFRC  --   --  0.50  CREATININE 0.70  --   --   TROPONINIHS 5 5  --      Estimated Creatinine Clearance: 126.9 mL/min (by C-G formula based on SCr of 0.7 mg/dL).   Medical History: Past Medical History:  Diagnosis Date   ADHD (attention deficit hyperactivity disorder)    Adjustment disorder with mixed anxiety and depressed mood    Asthma    Fibroid tumor    Hypertension    Kidney stones    Microcytic anemia    Pulmonary emboli (HCC)     Medications:  Scheduled:   Assessment: 46 yo F presents to ED with R leg cramping and intermittent L-sided chest pain x3 days. Pt has history of PE from 02/2017. Pt reports taking apixaban '5mg'$  BID, but discontinued it ~1 year ago without direction from physician. CT PE positive for subsegmental RUL PE.   Heparin level therapeutic on 1650 units/hr  Goal of Therapy:  Heparin level 0.3-0.7 units/ml Monitor platelets by anticoagulation protocol: Yes   Plan:  Continue heparin gtt at 1650 units/hr Daily heparin level, CBC, s/s bleeding F/u ability to transition to [place], PharmD, [facility] Clinical Pharmacist ED Pharmacist Phone # [phone] 12/12/2022 7:56 AM

## 2022-12-12 NOTE — Plan of Care (Signed)
  Problem: Clinical Measurements: Goal: Will remain free from infection Outcome: Progressing Goal: Diagnostic test results will improve Outcome: Progressing   

## 2022-12-12 NOTE — ED Provider Notes (Signed)
Patient awaiting bed assignment.  Alerted by nursing staff that she continues to complain of a headache.  Patient on heparin, therefore CT head performed to rule out intracranial bleeding.  CT unremarkable.  Treated with Reglan and Benadryl, as she was given this earlier today with improvement.   Orpah Greek, MD 12/12/22 [phone]

## 2022-12-13 ENCOUNTER — Inpatient Hospital Stay (HOSPITAL_COMMUNITY): Payer: Medicare Other

## 2022-12-13 DIAGNOSIS — M79604 Pain in right leg: Secondary | ICD-10-CM | POA: Diagnosis not present

## 2022-12-13 LAB — URINALYSIS, ROUTINE W REFLEX MICROSCOPIC
Bilirubin Urine: NEGATIVE
Glucose, UA: NEGATIVE mg/dL
Hgb urine dipstick: NEGATIVE
Ketones, ur: NEGATIVE mg/dL
Leukocytes,Ua: NEGATIVE
Nitrite: NEGATIVE
Protein, ur: NEGATIVE mg/dL
Specific Gravity, Urine: 1.01 (ref 1.005–1.030)
pH: 6 (ref 5.0–8.0)

## 2022-12-13 LAB — CBC
HCT: 38.8 % (ref 36.0–46.0)
Hemoglobin: 12.7 g/dL (ref 12.0–15.0)
MCH: 28.5 pg (ref 26.0–34.0)
MCHC: 32.7 g/dL (ref 30.0–36.0)
MCV: 87.2 fL (ref 80.0–100.0)
Platelets: 241 10*3/uL (ref 150–400)
RBC: 4.45 MIL/uL (ref 3.87–5.11)
RDW: 13 % (ref 11.5–15.5)
WBC: 8.6 10*3/uL (ref 4.0–10.5)
nRBC: 0 % (ref 0.0–0.2)

## 2022-12-13 LAB — BASIC METABOLIC PANEL
Anion gap: 10 (ref 5–15)
BUN: 5 mg/dL — ABNORMAL LOW (ref 6–20)
CO2: 24 mmol/L (ref 22–32)
Calcium: 9.4 mg/dL (ref 8.9–10.3)
Chloride: 98 mmol/L (ref 98–111)
Creatinine, Ser: 0.69 mg/dL (ref 0.44–1.00)
GFR, Estimated: >60 mL/min (ref >60)
Glucose, Bld: 155 mg/dL — ABNORMAL HIGH (ref 70–99)
Potassium: 3.1 mmol/L — ABNORMAL LOW (ref 3.5–5.1)
Sodium: 132 mmol/L — ABNORMAL LOW (ref 135–145)

## 2022-12-13 LAB — MAGNESIUM: Magnesium: 2.2 mg/dL (ref 1.7–2.4)

## 2022-12-13 LAB — HEPARIN LEVEL (UNFRACTIONATED): Heparin Unfractionated: 0.3 IU/mL (ref 0.30–0.70)

## 2022-12-13 MED ORDER — PREDNISONE 20 MG PO TABS
40.0000 mg | ORAL_TABLET | Freq: Every day | ORAL | Status: DC
  Administered 2022-12-14: 40 mg via ORAL
  Filled 2022-12-13: qty 2

## 2022-12-13 MED ORDER — METHOCARBAMOL 500 MG PO TABS: 500.0000 mg | ORAL_TABLET | Freq: Four times a day (QID) | ORAL | Status: DC | PRN

## 2022-12-13 MED ORDER — POTASSIUM CHLORIDE CRYS ER 20 MEQ PO TBCR
40.0000 meq | EXTENDED_RELEASE_TABLET | ORAL | Status: AC
  Administered 2022-12-13 (×2): 40 meq via ORAL
  Filled 2022-12-13 (×2): qty 2

## 2022-12-13 NOTE — Consult Note (Addendum)
Orthopaedic Trauma Service (OTS) Consult   Patient ID: Kristen Swanson MRN: [id number] DOB/AGE: 46-Jun-1977 70 y.o.   Reason for Consult: Right knee and calf pain Referring Physician: Niel Hummer, MD    HPI: Kristen Swanson is an 46 y.o. female tented to med center at [facility] on 12/11/2022 with complaint of right calf pain for 2 to 3 days.  Patient states that is felt as if she had a charley horse.  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  She does not really need to lift anything heavy but again is on her feet all day.  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  Patient lives in [place] apartment.  With respect to her right leg again she presented to [facility].  She does have a history of pulmonary embolus and is not anticoagulated.  Due to her right calf pain as well as her PE history it was decided that ultrasound and CT angiogram be performed.  These were completed ultrasound did not show evidence of a DVT but probable Baker's cyst in her right knee.  CT angiogram was notable for right sided PE.  Patient admitted to the medicine service for treatment of her PE.  She was transferred to [facility] consult was placed yesterday for Baker's cyst right leg and difficulty with ambulation due to Pain.  Patient was seen and evaluated today.  She is sitting in bedside chair.  She did ambulate in the room.  She actually reports that since starting prednisone and tramadol her pain has gotten better.  She is able to move her knee better than she was several days ago.  She denies any numbness or tingling in her lower extremity.  Denies any joint line pain no other complaints with respect to her right lower extremity. No locking or catching of her right knee.  No perceived weakness, just  pain  Patient does note that prior to presenting to the med center at [facility] she did feel a knot in the back of her calf.  She did have her kids massage this.  Since that time this knot has dissipated and is no longer present.  She does not have any history to her knowledge of any meniscal tears or other intra-articular pathology  I did order some plain x-rays of the right knee prior to seeing the patient as well based off of the chief complaint  Past Medical History:  Diagnosis Date   ADHD (attention deficit hyperactivity disorder)    Adjustment disorder with mixed anxiety and depressed mood    Asthma    Fibroid tumor    Hypertension    Kidney stones    Microcytic anemia    Pulmonary emboli (HCC)     Past Surgical History:  Procedure Laterality Date   LEG SURGERY     REPLACEMENT TOTAL KNEE Left 2008    Family History  Problem Relation Age of Onset   Colon cancer Mother 57   Stroke Father    Hypertension Father    Diabetes type I Daughter    Autism Son    Lung cancer Maternal Uncle     Social History:  reports that she has never smoked. She has never used smokeless tobacco. She  reports that she does not drink alcohol and does not use drugs.  Allergies: No Known Allergies  Medications: I have reviewed the patient's current medications. Meds ordered this encounter  Medications   diphenhydrAMINE (BENADRYL) injection 25 mg   metoCLOPramide (REGLAN) injection 10 mg   sodium chloride 0.9 % bolus 500 mL   iohexol (OMNIPAQUE) 350 MG/ML injection 100 mL   potassium chloride (KLOR-CON) packet 40 mEq   morphine (PF) 4 MG/ML injection 4 mg   morphine (PF) 2 MG/ML injection 2 mg   heparin bolus via infusion 6,500 Units   heparin ADULT infusion 100 units/mL (25000 units/250m)   metoCLOPramide (REGLAN) injection 10 mg   diphenhydrAMINE (BENADRYL) injection 25 mg   acetaminophen (TYLENOL) tablet 650 mg   acetaminophen (TYLENOL) tablet 1,000 mg   traMADol (ULTRAM) tablet 50  mg   DISCONTD: hydrochlorothiazide (HYDRODIURIL) tablet 25 mg   ondansetron (ZOFRAN-ODT) disintegrating tablet 4 mg   ferrous sulfate tablet 325 mg   cyclobenzaprine (FLEXERIL) tablet 10 mg   HYDROmorphone (DILAUDID) injection 0.5-1 mg   bisacodyl (DULCOLAX) EC tablet 5 mg   senna-docusate (Senokot-S) tablet 1 tablet   capsicum (ZOSTRIX) 0.075 % cream   DISCONTD: predniSONE (DELTASONE) tablet 40 mg   potassium chloride SA (KLOR-CON M) CR tablet 40 mEq   predniSONE (DELTASONE) tablet 40 mg     Results for orders placed or performed during the hospital encounter of 12/11/22 (from the past 48 hour(s))  CBC     Status: None   Collection Time: 12/11/22  5:23 PM  Result Value Ref Range   WBC 5.5 4.0 - 10.5 K/uL   RBC 4.25 3.87 - 5.11 MIL/uL   Hemoglobin 12.1 12.0 - 15.0 g/dL   HCT 37.1 36.0 - 46.0 %   MCV 87.3 80.0 - 100.0 fL   MCH 28.5 26.0 - 34.0 pg   MCHC 32.6 30.0 - 36.0 g/dL   RDW 13.3 11.5 - 15.5 %   Platelets PLATELET CLUMPS NOTED ON SMEAR, UNABLE TO ESTIMATE 150 - 400 K/uL    Comment: RESULTS VERIFIED VIA RECOLLECT   nRBC 0.0 0.0 - 0.2 %    Comment: Performed at [facility] [address] [id number] Troponin I (High Sensitivity)     Status: None   Collection Time: 12/11/22  5:23 PM  Result Value Ref Range   Troponin I (High Sensitivity) 5 <18 ng/L    Comment: (NOTE) Elevated high sensitivity troponin I (hsTnI) values and significant  changes across serial measurements may suggest ACS but many other  chronic and acute conditions are known to elevate hsTnI results.  Refer to the "Links" section for chest pain algorithms and additional  guidance. Performed at [facility] [address] [place] [address]  Brain natriuretic peptide     Status: None   Collection Time: 12/11/22  6:18 PM  Result Value Ref Range   B Natriuretic Peptide 47.1 0.0 - 100.0 pg/mL    Comment: Performed at [facility] [address] [id number] Urinalysis, Routine w reflex microscopic Urine, Clean Catch     Status: None   Collection Time: 12/12/22  4:30 AM  Result Value Ref Range   Color, Urine YELLOW YELLOW   APPearance CLEAR CLEAR   Specific Gravity, Urine 1.010 1.005 - 1.030   pH 6.0 5.0 - 8.0   Glucose, UA NEGATIVE NEGATIVE mg/dL   Hgb urine dipstick NEGATIVE NEGATIVE   Bilirubin Urine  NEGATIVE NEGATIVE   Ketones, ur NEGATIVE NEGATIVE mg/dL   Protein, ur NEGATIVE NEGATIVE mg/dL   Nitrite NEGATIVE NEGATIVE   Leukocytes,Ua NEGATIVE NEGATIVE    Comment: Performed at [facility], [place] [address], [place], [place] [address]  Heparin level (unfractionated)     Status: None   Collection Time: 12/12/22  5:24 AM  Result Value Ref Range   Heparin Unfractionated 0.50 0.30 - 0.70 IU/mL    Comment: (NOTE) The clinical reportable range upper limit is being lowered to >1.10 to align with the FDA approved guidance for the current laboratory assay.  If heparin results are below expected values, and patient dosage has  been confirmed, suggest follow up testing of antithrombin III levels. Performed at [facility], [place] [address], [place], [place] [address]   CBC     Status: Abnormal   Collection Time: 12/12/22  5:24 AM  Result Value Ref Range   WBC 7.6 4.0 - 10.5 K/uL   RBC 4.17 3.87 - 5.11 MIL/uL   Hemoglobin 11.8 (L) 12.0 - 15.0 g/dL   HCT 36.3 36.0 - 46.0 %   MCV 87.1 80.0 - 100.0 fL   MCH 28.3 26.0 - 34.0 pg   MCHC 32.5 30.0 - 36.0 g/dL   RDW 13.2 11.5 - 15.5 %   Platelets 225 150 - 400 K/uL   nRBC 0.0 0.0 - 0.2 %    Comment: Performed at [facility], [address], [place], [place] [address]  HIV Antibody (routine testing w rflx)     Status: None   Collection Time: 12/12/22 11:47 AM  Result Value Ref Range   HIV Screen 4th Generation wRfx Non Reactive Non Reactive    Comment: Performed at [facility], [address] [place], [place] [address]  Potassium     Status: Abnormal   Collection Time: 12/12/22 11:47 AM  Result Value Ref Range   Potassium 3.1 (L) 3.5 - 5.1 mmol/L    Comment: Performed at [facility] [address], [place], [place] [address]  Basic metabolic panel     Status: Abnormal   Collection Time: 12/13/22  3:04 AM  Result Value Ref Range   Sodium 132 (L) 135 - 145 mmol/L   Potassium 3.1 (L) 3.5 - 5.1 mmol/L   Chloride 98 98 - 111 mmol/L   CO2 24 22 - 32 mmol/L   Glucose, Bld 155 (H) 70 - 99 mg/dL    Comment: Glucose reference range applies only to samples taken after fasting for at least 8 hours.   BUN 5 (L) 6 - 20 mg/dL   Creatinine, Ser 0.69 0.44 - 1.00 mg/dL   Calcium 9.4 8.9 - 10.3 mg/dL   GFR, Estimated >60 >60 mL/min    Comment: (NOTE) Calculated using the CKD-EPI Creatinine Equation (2021)    Anion gap 10 5 - 15    Comment: Performed at [facility] [address], [place], [place] [address]  Heparin level (unfractionated)     Status: None   Collection Time: 12/13/22  3:04 AM  Result Value Ref Range   Heparin Unfractionated 0.30 0.30 - 0.70 IU/mL    Comment: (NOTE) The clinical reportable range upper limit is being lowered to >1.10 to align with the FDA approved guidance for the current laboratory assay.  If heparin results are below expected values, and patient dosage has  been confirmed, suggest follow up testing of antithrombin III levels. Performed at  [facility], [place] [address], [place], [place] [address]   CBC     Status: None   Collection Time: 12/13/22  3:04 AM  Result Value Ref Range   WBC 8.6 4.0 - 10.5 K/uL   RBC 4.45 3.87 - 5.11 MIL/uL   Hemoglobin 12.7 12.0 - 15.0 g/dL   HCT 38.8 36.0 - 46.0 %   MCV 87.2 80.0 - 100.0 fL   MCH 28.5 26.0 - 34.0 pg   MCHC 32.7 30.0 - 36.0 g/dL   RDW 13.0 11.5 - 15.5 %   Platelets 241 150 - 400 K/uL   nRBC 0.0 0.0 - 0.2 %    Comment: Performed at [facility], [place] [address]  [place], [place] [address]  Magnesium     Status: None   Collection Time: 12/13/22  3:04 AM  Result Value Ref Range   Magnesium 2.2 1.7 - 2.4 mg/dL    Comment: Performed at [facility] [address], [place], [place] [address]    DG Knee Right Port  Result Date: 12/13/2022 CLINICAL DATA:  Pain EXAM: PORTABLE RIGHT KNEE - 1-2 VIEW COMPARISON:  None Available. FINDINGS: There is no evidence of acute fracture. 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 There is a small joint effusion. IMPRESSION: No acute osseous abnormality.  Small joint effusion. Mild medial and patellofemoral predominant osteoarthritis. Electronically Signed   By: Maurine Simmering M.D.   On: 12/13/2022 16:36   ECHOCARDIOGRAM COMPLETE  Result Date: 12/12/2022    ECHOCARDIOGRAM REPORT   Patient Name:   Kristen Swanson Date of Exam: 12/12/2022 Medical Rec #:  [id number]   Height:       70.0 in Accession #:    [id number]  Weight:       277.8 lb Date of Birth:  May 02, 1976   BSA:          2.400 m Patient Age:    70 years    BP:           112/88 mmHg Patient Gender: F           HR:           92 bpm. Exam Location:  Inpatient Procedure: 2D Echo, Cardiac Doppler and Color Doppler Indications:    Pulmonary Embolus I26.09  History:        Patient has prior history of Echocardiogram examinations, most                 recent 03/20/2017. Risk Factors:Hypertension.  Sonographer:    Darlina Sicilian RDCS Referring Phys: [id number] Lequita Halt  Sonographer Comments: Technically challenging study limited acoustic windows due to limited mobility. Patient unable to turn for echo due to leg pain. IMPRESSIONS  1. Left ventricular ejection fraction, by estimation, is 55 to 60%. The left ventricle has normal function. The left ventricle has no regional wall motion abnormalities. Left ventricular diastolic function could not be evaluated.  2. Right ventricular systolic function is normal. The right ventricular size is normal. Tricuspid regurgitation  signal is inadequate for assessing PA pressure.  3. The mitral valve is normal in structure. No evidence of mitral valve regurgitation. No evidence of mitral stenosis.  4. The aortic valve is normal in structure. Aortic valve regurgitation is not visualized. No aortic stenosis is present.  5. The inferior vena cava is normal in size with greater than 50% respiratory variability, suggesting right atrial pressure of 3 mmHg. FINDINGS  Left Ventricle: Left  ventricular ejection fraction, by estimation, is 55 to 60%. The left ventricle has normal function. The left ventricle has no regional wall motion abnormalities. The left ventricular internal cavity size was normal in size. There is  no left ventricular hypertrophy. Left ventricular diastolic function could not be evaluated. Right Ventricle: The right ventricular size is normal. No increase in right ventricular wall thickness. Right ventricular systolic function is normal. Tricuspid regurgitation signal is inadequate for assessing PA pressure. Left Atrium: Left atrial size was normal in size. Right Atrium: Right atrial size was normal in size. Pericardium: There is no evidence of pericardial effusion. Mitral Valve: The mitral valve is normal in structure. No evidence of mitral valve regurgitation. No evidence of mitral valve stenosis. Tricuspid Valve: The tricuspid valve is normal in structure. Tricuspid valve regurgitation is not demonstrated. No evidence of tricuspid stenosis. Aortic Valve: The aortic valve is normal in structure. Aortic valve regurgitation is not visualized. No aortic stenosis is present. Pulmonic Valve: The pulmonic valve was normal in structure. Pulmonic valve regurgitation is trivial. No evidence of pulmonic stenosis. Aorta: The aortic root and ascending aorta are structurally normal, with no evidence of dilitation. Venous: The inferior vena cava is normal in size with greater than 50% respiratory variability, suggesting right atrial pressure of  3 mmHg. IAS/Shunts: No atrial level shunt detected by color flow Doppler.  LEFT VENTRICLE PLAX 2D LVIDd:         4.90 cm   Diastology LVIDs:         3.00 cm   LV e' medial:  8.70 cm/s LV PW:         0.70 cm   LV e' lateral: 8.16 cm/s LV IVS:        1.00 cm LVOT diam:     2.20 cm LV SV:         64 LV SV Index:   27 LVOT Area:     3.80 cm  LEFT ATRIUM             Index LA diam:        4.20 cm 1.75 cm/m LA Vol (A2C):   46.4 ml 19.33 ml/m LA Vol (A4C):   44.6 ml 18.58 ml/m LA Biplane Vol: 47.1 ml 19.62 ml/m  AORTIC VALVE LVOT Vmax:   85.80 cm/s LVOT Vmean:  65.700 cm/s LVOT VTI:    0.168 m  AORTA Ao Root diam: 2.70 cm Ao Asc diam:  2.60 cm  SHUNTS Systemic VTI:  0.17 m Systemic Diam: 2.20 cm Fransico Him MD Electronically signed by Fransico Him MD Signature Date/Time: 12/12/2022/2:14:43 PM    Final    CT Head Wo Contrast  Result Date: 12/12/2022 CLINICAL DATA:  Headache EXAM: CT HEAD WITHOUT CONTRAST TECHNIQUE: Contiguous axial images were obtained from the base of the skull through the vertex without intravenous contrast. RADIATION DOSE REDUCTION: This exam was performed according to the departmental dose-optimization program which includes automated exposure control, adjustment of the mA and/or kV according to patient size and/or use of iterative reconstruction technique. COMPARISON:  10/11/2016 FINDINGS: Brain: No evidence of acute infarction, hemorrhage, hydrocephalus, extra-axial collection or mass lesion/mass effect. Vascular: No hyperdense vessel or unexpected calcification. Skull: Normal. Negative for fracture or focal lesion. Sinuses/Orbits: No acute finding. IMPRESSION: Negative head CT. Electronically Signed   By: Jorje Guild M.D.   On: 12/12/2022 04:18   US Venous Img Lower Unilateral Right  Result Date: 12/11/2022 CLINICAL DATA:  Right lower extremity edema. EXAM: Right LOWER EXTREMITY  VENOUS DOPPLER ULTRASOUND TECHNIQUE: Gray-scale sonography with compression, as well as color and duplex  ultrasound, were performed to evaluate the deep venous system(s) from the level of the common femoral vein through the popliteal and proximal calf veins. COMPARISON:  None Available. FINDINGS: VENOUS Normal compressibility of the common femoral, superficial femoral, and popliteal veins, as well as the visualized calf veins. Visualized portions of profunda femoral vein and great saphenous vein unremarkable. No filling defects to suggest DVT on grayscale or color Doppler imaging. Doppler waveforms show normal direction of venous flow, normal respiratory plasticity and response to augmentation. Limited views of the contralateral common femoral vein are unremarkable. OTHER Probable Baker's cyst seen in right popliteal fossa. Limitations: none IMPRESSION: No definite evidence of deep venous thrombosis seen in right lower extremity. Electronically Signed   By: Marijo Conception M.D.   On: 12/11/2022 18:04    Intake/Output      12/14 0701 12/15 0700 12/15 0701 12/16 0700   P.O. 600    I.V. (mL/kg) 515.3 (4.1)    IV Piggyback     Total Intake(mL/kg) 1115.3 (8.9)    Urine (mL/kg/hr) 1900 (0.6)    Total Output 1900    Net -784.7         Urine Occurrence 1 x       Review of Systems  Constitutional:  Negative for chills and fever.  Respiratory:  Negative for shortness of breath.   Musculoskeletal:        Right calf pain  Neurological:  Negative for tingling and sensory change.   Blood pressure 122/86, pulse 96, temperature 97.8 F (36.6 C), temperature source Oral, resp. rate 17, height '5\' 10"'$  (1.778 m), weight 126 kg, last menstrual period 12/27/2019, SpO2 99 %. Physical Exam Vitals and nursing note reviewed.  Constitutional:      General: She is awake. She is not in acute distress.    Appearance: Normal appearance. She is obese.     Comments: Very pleasant female sitting in bedside chair, no acute distress  Cardiovascular:     Heart sounds: S1 normal and S2 normal.  Pulmonary:     Effort:  Pulmonary effort is normal.     Comments: Unlabored Musculoskeletal:     Comments: Right lower extremity No gross deformities to the right leg including the knee Symmetric edema to lower legs noted Small joint effusion to the right knee No joint line tenderness really appreciated Most of her tenderness is located over her proximal calf Mild tenderness in the popliteal space. No palpable mass appreciated on exam today  Pain with resisted knee flexion No pain with knee extension No pain with resistance to ankle flexion, extension, inversion or eversion DPN, SPN, TN sensory functions are grossly intact EHL, FHL, lesser toe motor function intact Compartments are soft but again she is tender posterior proximal calf + DP pulse Extremity is warm No pain with axial loading or logrolling of her hip Patient is able to actively flex and extend her knee without significant pain. She was initially seated with her knee in full extension We did move the leg rest to the down position and she moved her knee without much difficulty to a flexed position Minimal pain with McMurray's testing No palpable clunk with McMurray's No significant knee crepitus appreciated Knee was stable with varus valgus stressing at full extension and 30 degrees of flexion.  Neurological:     Mental Status: She is alert.  Psychiatric:  Behavior: Behavior is cooperative.      Assessment/Plan:  46 year old female admitted for acute PE with right calf and knee pain  -Acute pulmonary embolism  Per medicine  -Right calf and knee pain  Doppler ultrasound noted possible Baker's cyst.    Baker's cyst are usually diagnosed with MRI not ultrasound.  Ultrasound is typically used for guided drainage.  I also do not think a Baker's cyst would necessarily cause her significant mobility restrictions.  It is possible that the cyst ruptured given the description of a palpable lump which is no longer present.  Patient also does  not have a history of meniscal tear by her report.  X-rays of the right knee show very mild arthritis as well.   She does remain significantly tender over her proximal calf into the belly   I do think that it would be worthwhile to proceed with MRI of her right leg scanning to mid calf.  MRI would be able to demonstrate presence of a Baker's cyst, size and location.  It could also show [place] if she has a gastrocnemius tear which can also prevent with severe Pain and difficulty ambulating   She can weight-bear as tolerated with assistance if needed  No range of motion restrictions   Ice as needed.  Also can try heat   Her pain has been improving with prednisone and tramadol which does make me think that this is a muscular issue  Would recommend prednisone taper at discharge starting with 40 mg daily and tapering down over 10 days.  Can also give a short course of Ultram for 5 to 7 days as well   Would like for her to work with therapy again   She does have stairs to get into the second floor apartment and will need to learn techniques to get up and down  - Pain management:  Continue with prednisone and tramadol  Ice and heat  Will add low-dose muscle relaxer  - Dispo:  Continue per medicine team  Okay to discharge from orthopedic standpoint.  Would like for her to get an MRI prior to discharge but no acute interventions will need to take place during this hospitalization.  We can follow-up on the results of the MRI in the outpatient setting.  Recommend following up with orthopedics in 10 to 14 days   Jari Pigg, PA-C [phone] (C) 12/13/2022, 5:21 PM  [facility] [place] [address] [phone] Domingo Sep (F)    After 5pm and on the weekends please log on to Amion, go to orthopaedics and the look under the [facility] Call for the provider(s) on call. You can also call our office at [phone] and then follow the  prompts to be connected to the call team.

## 2022-12-13 NOTE — Plan of Care (Signed)
  Problem: Education: Goal: Knowledge of General Education information will improve Description Including pain rating scale, medication(s)/side effects and non-pharmacologic comfort measures Outcome: Progressing   Problem: Health Behavior/Discharge Planning: Goal: Ability to manage health-related needs will improve Outcome: Progressing   

## 2022-12-13 NOTE — Progress Notes (Signed)
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  Has SIRS, low K+, low Na+, anemia, and R apical lung infarct.   PMHx:  PE, DVT, ADHD, morbid obesity,  Clinical Impression  Pt was seen for mobility on RW, to stand and get into [facility] and then recliner.  Pt is hindered to return home with 16 STE at home, and is motivated to do this in spite of her pain limiting ROM.  MD after visit verified she can stand on R knee and walk with no limitations, so may try stairs tomorrow if able to go home.  Pain should be managed with ice and meds first.  HHPT is recommended to work on strengthening and monitor O2 sats as well.         Recommendations for follow up therapy are one component of a multi-disciplinary discharge planning process, led by the attending physician.  Recommendations may be updated based on patient status, additional functional criteria and insurance authorization.  Follow Up Recommendations Home health PT      Assistance Recommended at Discharge Frequent or constant Supervision/Assistance  Patient can return home with the following  A lot of help with walking and/or transfers;A little help with bathing/dressing/bathroom;Assistance with cooking/housework;Assist for transportation;Help with stairs or ramp for entrance    Equipment Recommendations Rolling walker (2 wheels);BSC/3in1  Recommendations for Other Services       Functional Status Assessment Patient has had a recent decline in their functional status and demonstrates the ability to make significant improvements in function in a reasonable and predictable amount of time.     Precautions / Restrictions Precautions Precautions: Fall Precaution Comments: Posterior R knee pain Restrictions Weight Bearing  Restrictions: No      Mobility  Bed Mobility Overal bed mobility: Modified Independent, Needs Assistance Bed Mobility: Supine to Sit, Sit to Supine     Supine to sit: Min guard, Min assist Sit to supine: Min assist   General bed mobility comments: mainly to assist RLE    Transfers Overall transfer level: Needs assistance Equipment used: Rolling walker (2 wheels), 1 person hand held assist Transfers: Sit to/from Stand, Bed to chair/wheelchair/BSC Sit to Stand: Mod assist   Step pivot transfers: Mod assist       General transfer comment: mod assist to support effort to stand on RLE due to pain    Ambulation/Gait               General Gait Details: too painful  Stairs            Wheelchair Mobility    Modified Rankin (Stroke Patients Only)       Balance Overall balance assessment: Needs assistance Sitting-balance support: Feet supported Sitting balance-Leahy Scale: Good     Standing balance support: Bilateral upper extremity supported, During functional activity Standing balance-Leahy Scale: Poor                               Pertinent Vitals/Pain Pain Assessment Pain Assessment: Faces Faces Pain Scale: Hurts even more Pain Location: R knee with movement Pain Descriptors / Indicators: Aching, Grimacing, Tender Pain Intervention(s): Limited activity within patient's tolerance, Premedicated before session, Monitored during session, Repositioned, Ice applied    Home Living Family/patient  expects to be discharged to:: Private residence Living Arrangements: Children Available Help at Discharge: Family;Available PRN/intermittently Type of Home: Other(Comment) (condo) Home Access: Stairs to enter Entrance Stairs-Rails: Left Entrance Stairs-Number of Steps: 16   Home Layout: One level Home Equipment: None Additional Comments: has children who rotate being there due to school    Prior Function Prior Level of Function :  Working/employed             Mobility Comments: on her feet all day at work       Hand Dominance   Dominant Hand: Right    Extremity/Trunk Assessment   Upper Extremity Assessment Upper Extremity Assessment: Overall WFL for tasks assessed    Lower Extremity Assessment Lower Extremity Assessment: RLE deficits/detail RLE Deficits / Details: pain to move R knee over ruptured baker cyst RLE: Unable to fully assess due to pain RLE Coordination: decreased gross motor    Cervical / Trunk Assessment Cervical / Trunk Assessment: Normal  Communication   Communication: No difficulties  Cognition Arousal/Alertness: Awake/alert Behavior During Therapy: WFL for tasks assessed/performed Overall Cognitive Status: No family/caregiver present to determine baseline cognitive functioning                                 General Comments: history of ADHD        General Comments General comments (skin integrity, edema, etc.): Pt is up to side of bed with no help but requires help for moving RLE to rest on the floor.  Her transition to [facility] was slow then to recliner with poor control of descent    Exercises     Assessment/Plan    PT Assessment Patient needs continued PT services  PT Problem List Decreased strength;Decreased range of motion;Decreased activity tolerance;Decreased balance;Decreased mobility;Decreased coordination;Decreased cognition;Decreased knowledge of use of DME;Decreased safety awareness;Decreased knowledge of precautions;Cardiopulmonary status limiting activity;Pain;Obesity       PT Treatment Interventions DME instruction;Gait training;Stair training;Functional mobility training;Therapeutic activities;Therapeutic exercise;Balance training;Neuromuscular re-education;Patient/family education    PT Goals (Current goals can be found in the Care Plan section)  Acute Rehab PT Goals Patient Stated Goal: to walk and get home PT Goal Formulation: With  patient Time For Goal Achievement: 12/27/22 Potential to Achieve Goals: Good    Frequency Min 3X/week     Co-evaluation               AM-PAC PT "6 Clicks" Mobility  Outcome Measure Help needed turning from your back to your side while in a flat bed without using bedrails?: None Help needed moving from lying on your back to sitting on the side of a flat bed without using bedrails?: A Little Help needed moving to and from a bed to a chair (including a wheelchair)?: A Little Help needed standing up from a chair using your arms (e.g., wheelchair or bedside chair)?: A Lot Help needed to walk in hospital room?: Total Help needed climbing 3-5 steps with a railing? : Total 6 Click Score: 14    End of Session Equipment Utilized During Treatment: Gait belt Activity Tolerance: Patient limited by fatigue;Patient limited by pain Patient left: in chair;with call bell/phone within reach;with chair alarm set Nurse Communication: Mobility status;Patient requests pain meds PT Visit Diagnosis: Unsteadiness on feet (R26.81);Repeated falls (R29.6);Muscle weakness (generalized) (M62.81);Pain Pain - Right/Left: Right Pain - part of body: Knee    Time: 4580-9983 PT Time Calculation (min) (ACUTE ONLY): 33 min   Charges:  PT Evaluation $PT Eval Moderate Complexity: 1 Mod PT Treatments $Therapeutic Activity: 8-22 mins       Ramond Dial 12/13/2022, 5:04 PM  Mee Hives, PT PhD Acute Rehab Dept. Number: [place] [facility]

## 2022-12-13 NOTE — Progress Notes (Signed)
PROGRESS NOTE    Kristen Swanson  MPN:[id number] DOB: Mar 24, 1976 DOA: 12/11/2022 PCP: Mittie Bodo   Brief Narrative: 46 year old with past medical history significant for DVT/PE compliant with Eliquis, hypertension, chronic thyroid deficiency anemia, ADHD, morbid obesity presented with sudden onset of right knee pain, left side chest pain for the last 2 days.  Past medical history significant for DVT and PE diagnosed 2018 and has been on Eliquis.  3 months ago patient stopped taking Eliquis by her own.   Evaluation in the ED she was found to have low-grade fever, CT angio show acute right upper lobe segmental PE with apical infarct.  Doppler lower extremity showed large Baker's cyst in her right popliteal area.    Assessment & Plan:   Principal Problem:   Acute pulmonary embolism (HCC) Active Problems:   Pulmonary emboli (HCC)   Baker's cyst   Fever   1-Right upper lobe subsegmental PE and Right apical lung infarct: Noncompliant with medications Started on heparin drip-- transition to eliquis after ortho evaluation.  Echo: 00-86 % RV systolic Function is normal.   Acute ambulation dysfunction Right popliteal Baker's cyst: Orthopedic consulted.  Started on prednisone Would she benefit from steroid injection.   SIRS: Sepsis  rule out No further fever.  Suspect related to PE  Hypokalemia. Replete orally.  Mg normal.   Hyponatremia;  On fluids.  Headache; tylenol PRN.   Chronic iron deficiency anemia.  On iron  Morbid Obesity:  Life style modification.    Estimated body mass index is 39.86 kg/m as calculated from the following:   Height as of this encounter: '5\' 10"'$  (1.778 m).   Weight as of this encounter: 126 kg.   DVT prophylaxis: heparin  Code Status: Full code Family Communication: Disposition Plan:  Status is: Inpatient Remains inpatient appropriate because: management of PE, backer cyst     Consultants:  Ortho  Procedures:     Antimicrobials:    Subjective: She is feeling better from chest pain. Complaints of severe pain right leg, knee.   Objective: Vitals:   12/12/22 1221 12/12/22 1606 12/12/22 2156 12/13/22 0448  BP: 112/88 (!) 154/91 (!) 124/97 (!) 137/100  Pulse: 92 93 98 83  Resp: '14 20  16  '$ Temp: 98.3 F (36.8 C) 99.1 F (37.3 C) 98.6 F (37 C) 98.3 F (36.8 C)  TempSrc: Oral Oral Oral Oral  SpO2: 99% 100% 98% 100%  Weight:      Height:        Intake/Output Summary (Last 24 hours) at 12/13/2022 0818 Last data filed at 12/13/2022 0600 Gross per 24 hour  Intake 1115.31 ml  Output 1900 ml  Net -784.69 ml   Filed Weights   12/11/22 2200  Weight: 126 kg    Examination:  General exam: Appears calm and comfortable  Respiratory system: Clear to auscultation. Respiratory effort normal. Cardiovascular system: S1 & S2 heard, RRR. No JVD, murmurs, rubs, gallops or clicks. No pedal edema. Gastrointestinal system: Abdomen is nondistended, soft and nontender. No organomegaly or masses felt. Normal bowel sounds heard. Central nervous system: Alert and oriented. No focal neurological deficits. Extremities: very tender right knee.    Data Reviewed: I have personally reviewed following labs and imaging studies  CBC: Recent Labs  Lab 12/11/22 1723 12/12/22 0524 12/13/22 0304  WBC 5.5 7.6 8.6  HGB 12.1 11.8* 12.7  HCT 37.1 36.3 38.8  MCV 87.3 87.1 87.2  PLT PLATELET CLUMPS NOTED ON SMEAR, UNABLE TO ESTIMATE 225 241  Basic Metabolic Panel: Recent Labs  Lab 12/11/22 1530 12/12/22 1147 12/13/22 0304  NA 135  --  132*  K 3.0* 3.1* 3.1*  CL 102  --  98  CO2 24  --  24  GLUCOSE 118*  --  155*  BUN 5*  --  5*  CREATININE 0.70  --  0.69  CALCIUM 9.6  --  9.4   GFR: Estimated Creatinine Clearance: 126.9 mL/min (by C-G formula based on SCr of 0.69 mg/dL). Liver Function Tests: No results for input(s): "AST", "ALT", "ALKPHOS", "BILITOT", "PROT", "ALBUMIN" in the last 168  hours. No results for input(s): "LIPASE", "AMYLASE" in the last 168 hours. No results for input(s): "AMMONIA" in the last 168 hours. Coagulation Profile: No results for input(s): "INR", "PROTIME" in the last 168 hours. Cardiac Enzymes: No results for input(s): "CKTOTAL", "CKMB", "CKMBINDEX", "TROPONINI" in the last 168 hours. BNP (last 3 results) No results for input(s): "PROBNP" in the last 8760 hours. HbA1C: No results for input(s): "HGBA1C" in the last 72 hours. CBG: No results for input(s): "GLUCAP" in the last 168 hours. Lipid Profile: No results for input(s): "CHOL", "HDL", "LDLCALC", "TRIG", "CHOLHDL", "LDLDIRECT" in the last 72 hours. Thyroid Function Tests: No results for input(s): "TSH", "T4TOTAL", "FREET4", "T3FREE", "THYROIDAB" in the last 72 hours. Anemia Panel: No results for input(s): "VITAMINB12", "FOLATE", "FERRITIN", "TIBC", "IRON", "RETICCTPCT" in the last 72 hours. Sepsis Labs: No results for input(s): "PROCALCITON", "LATICACIDVEN" in the last 168 hours.  Recent Results (from the past 240 hour(s))  Resp panel by RT-PCR (RSV, Flu A&B, Covid) Anterior Nasal Swab     Status: None   Collection Time: 12/11/22  3:50 PM   Specimen: Anterior Nasal Swab  Result Value Ref Range Status   SARS Coronavirus 2 by RT PCR NEGATIVE NEGATIVE Final    Comment: (NOTE) SARS-CoV-2 target nucleic acids are NOT DETECTED.  The SARS-CoV-2 RNA is generally detectable in upper respiratory specimens during the acute phase of infection. The lowest concentration of SARS-CoV-2 viral copies this assay can detect is 138 copies/mL. A negative result does not preclude SARS-Cov-2 infection and should not be used as the sole basis for treatment or other patient management decisions. A negative result may occur with  improper specimen collection/handling, submission of specimen other than nasopharyngeal swab, presence of viral mutation(s) within the areas targeted by this assay, and inadequate  number of viral copies(<138 copies/mL). A negative result must be combined with clinical observations, patient history, and epidemiological information. The expected result is Negative.  Fact Sheet for Patients:  EntrepreneurPulse.com.au  Fact Sheet for Healthcare Providers:  IncredibleEmployment.be  This test is no t yet approved or cleared by the [place] FDA and  has been authorized for detection and/or diagnosis of SARS-CoV-2 by FDA under an Emergency Use Authorization (EUA). This EUA will remain  in effect (meaning this test can be used) for the duration of the COVID-19 declaration under Section 564(b)(1) of the Act, 21 U.S.C.section 360bbb-3(b)(1), unless the authorization is terminated  or revoked sooner.       Influenza A by PCR NEGATIVE NEGATIVE Final   Influenza B by PCR NEGATIVE NEGATIVE Final    Comment: (NOTE) The Xpert Xpress SARS-CoV-2/FLU/RSV plus assay is intended as an aid in the diagnosis of influenza from Nasopharyngeal swab specimens and should not be used as a sole basis for treatment. Nasal washings and aspirates are unacceptable for Xpert Xpress SARS-CoV-2/FLU/RSV testing.  Fact Sheet for Patients: EntrepreneurPulse.com.au  Fact Sheet for Healthcare Providers: IncredibleEmployment.be  This test is not yet approved or cleared by the [place] and has been authorized for detection and/or diagnosis of SARS-CoV-2 by FDA under an Emergency Use Authorization (EUA). This EUA will remain in effect (meaning this test can be used) for the duration of the COVID-19 declaration under Section 564(b)(1) of the Act, 21 U.S.C. section 360bbb-3(b)(1), unless the authorization is terminated or revoked.     Resp Syncytial Virus by PCR NEGATIVE NEGATIVE Final    Comment: (NOTE) Fact Sheet for Patients: EntrepreneurPulse.com.au  Fact Sheet for Healthcare  Providers: IncredibleEmployment.be  This test is not yet approved or cleared by the [place] FDA and has been authorized for detection and/or diagnosis of SARS-CoV-2 by FDA under an Emergency Use Authorization (EUA). This EUA will remain in effect (meaning this test can be used) for the duration of the COVID-19 declaration under Section 564(b)(1) of the Act, 21 U.S.C. section 360bbb-3(b)(1), unless the authorization is terminated or revoked.  Performed at [facility], [address] [place], [place] [address]          Radiology Studies: ECHOCARDIOGRAM COMPLETE  Result Date: 12/12/2022    ECHOCARDIOGRAM REPORT   Patient Name:   DEMETRIC PARSLOW Date of Exam: 12/12/2022 Medical Rec #:  [id number]   Height:       70.0 in Accession #:    [id number]  Weight:       277.8 lb Date of Birth:  12/07/1976   BSA:          2.400 m Patient Age:    61 years    BP:           112/88 mmHg Patient Gender: F           HR:           92 bpm. Exam Location:  Inpatient Procedure: 2D Echo, Cardiac Doppler and Color Doppler Indications:    Pulmonary Embolus I26.09  History:        Patient has prior history of Echocardiogram examinations, most                 recent 03/20/2017. Risk Factors:Hypertension.  Sonographer:    Darlina Sicilian RDCS Referring Phys: [id number] Lequita Halt  Sonographer Comments: Technically challenging study limited acoustic windows due to limited mobility. Patient unable to turn for echo due to leg pain. IMPRESSIONS  1. Left ventricular ejection fraction, by estimation, is 55 to 60%. The left ventricle has normal function. The left ventricle has no regional wall motion abnormalities. Left ventricular diastolic function could not be evaluated.  2. Right ventricular systolic function is normal. The right ventricular size is normal. Tricuspid regurgitation signal is inadequate for assessing PA pressure.  3. The mitral valve is normal in structure. No evidence of  mitral valve regurgitation. No evidence of mitral stenosis.  4. The aortic valve is normal in structure. Aortic valve regurgitation is not visualized. No aortic stenosis is present.  5. The inferior vena cava is normal in size with greater than 50% respiratory variability, suggesting right atrial pressure of 3 mmHg. FINDINGS  Left Ventricle: Left ventricular ejection fraction, by estimation, is 55 to 60%. The left ventricle has normal function. The left ventricle has no regional wall motion abnormalities. The left ventricular internal cavity size was normal in size. There is  no left ventricular hypertrophy. Left ventricular diastolic function could not be evaluated. Right Ventricle: The right ventricular size is normal. No increase in right ventricular wall thickness. Right ventricular  systolic function is normal. Tricuspid regurgitation signal is inadequate for assessing PA pressure. Left Atrium: Left atrial size was normal in size. Right Atrium: Right atrial size was normal in size. Pericardium: There is no evidence of pericardial effusion. Mitral Valve: The mitral valve is normal in structure. No evidence of mitral valve regurgitation. No evidence of mitral valve stenosis. Tricuspid Valve: The tricuspid valve is normal in structure. Tricuspid valve regurgitation is not demonstrated. No evidence of tricuspid stenosis. Aortic Valve: The aortic valve is normal in structure. Aortic valve regurgitation is not visualized. No aortic stenosis is present. Pulmonic Valve: The pulmonic valve was normal in structure. Pulmonic valve regurgitation is trivial. No evidence of pulmonic stenosis. Aorta: The aortic root and ascending aorta are structurally normal, with no evidence of dilitation. Venous: The inferior vena cava is normal in size with greater than 50% respiratory variability, suggesting right atrial pressure of 3 mmHg. IAS/Shunts: No atrial level shunt detected by color flow Doppler.  LEFT VENTRICLE PLAX 2D LVIDd:          4.90 cm   Diastology LVIDs:         3.00 cm   LV e' medial:  8.70 cm/s LV PW:         0.70 cm   LV e' lateral: 8.16 cm/s LV IVS:        1.00 cm LVOT diam:     2.20 cm LV SV:         64 LV SV Index:   27 LVOT Area:     3.80 cm  LEFT ATRIUM             Index LA diam:        4.20 cm 1.75 cm/m LA Vol (A2C):   46.4 ml 19.33 ml/m LA Vol (A4C):   44.6 ml 18.58 ml/m LA Biplane Vol: 47.1 ml 19.62 ml/m  AORTIC VALVE LVOT Vmax:   85.80 cm/s LVOT Vmean:  65.700 cm/s LVOT VTI:    0.168 m  AORTA Ao Root diam: 2.70 cm Ao Asc diam:  2.60 cm  SHUNTS Systemic VTI:  0.17 m Systemic Diam: 2.20 cm Fransico Him MD Electronically signed by Fransico Him MD Signature Date/Time: 12/12/2022/2:14:43 PM    Final    CT Head Wo Contrast  Result Date: 12/12/2022 CLINICAL DATA:  Headache EXAM: CT HEAD WITHOUT CONTRAST TECHNIQUE: Contiguous axial images were obtained from the base of the skull through the vertex without intravenous contrast. RADIATION DOSE REDUCTION: This exam was performed according to the departmental dose-optimization program which includes automated exposure control, adjustment of the mA and/or kV according to patient size and/or use of iterative reconstruction technique. COMPARISON:  10/11/2016 FINDINGS: Brain: No evidence of acute infarction, hemorrhage, hydrocephalus, extra-axial collection or mass lesion/mass effect. Vascular: No hyperdense vessel or unexpected calcification. Skull: Normal. Negative for fracture or focal lesion. Sinuses/Orbits: No acute finding. IMPRESSION: Negative head CT. Electronically Signed   By: Jorje Guild M.D.   On: 12/12/2022 04:18   US Venous Img Lower Unilateral Right  Result Date: 12/11/2022 CLINICAL DATA:  Right lower extremity edema. EXAM: Right LOWER EXTREMITY VENOUS DOPPLER ULTRASOUND TECHNIQUE: Gray-scale sonography with compression, as well as color and duplex ultrasound, were performed to evaluate the deep venous system(s) from the level of the common femoral vein  through the popliteal and proximal calf veins. COMPARISON:  None Available. FINDINGS: VENOUS Normal compressibility of the common femoral, superficial femoral, and popliteal veins, as well as the visualized calf veins. Visualized portions of  profunda femoral vein and great saphenous vein unremarkable. No filling defects to suggest DVT on grayscale or color Doppler imaging. Doppler waveforms show normal direction of venous flow, normal respiratory plasticity and response to augmentation. Limited views of the contralateral common femoral vein are unremarkable. OTHER Probable Baker's cyst seen in right popliteal fossa. Limitations: none IMPRESSION: No definite evidence of deep venous thrombosis seen in right lower extremity. Electronically Signed   By: Marijo Conception M.D.   On: 12/11/2022 18:04   CT Angio Chest PE W and/or Wo Contrast  Result Date: 12/11/2022 CLINICAL DATA:  Pulmonary embolism (PE) suspected, high prob Chest pain. History of pulmonary embolus. EXAM: CT ANGIOGRAPHY CHEST WITH CONTRAST TECHNIQUE: Multidetector CT imaging of the chest was performed using the standard protocol during bolus administration of intravenous contrast. Multiplanar CT image reconstructions and MIPs were obtained to evaluate the vascular anatomy. RADIATION DOSE REDUCTION: This exam was performed according to the departmental dose-optimization program which includes automated exposure control, adjustment of the mA and/or kV according to patient size and/or use of iterative reconstruction technique. CONTRAST:  129m OMNIPAQUE IOHEXOL 350 MG/ML SOLN COMPARISON:  Radiograph earlier today. Most recent chest CTA 03/31/2022 FINDINGS: Cardiovascular: Contrast bolus timing is suboptimal. Isolated subsegmental filling defect in the right upper lobe pulmonary artery series 4, image 38-39. The thoracic aorta is normal in caliber. No acute aortic findings. Heart is upper normal in size. No pericardial effusion. Mediastinum/Nodes: No  enlarged mediastinal or hilar lymph nodes. Mild bilateral axillary and subpectoral adenopathy is chronic and has been present on prior exams. Small hiatal hernia. Lungs/Pleura: Possible right apical small pulmonary infarct with subpleural ground-glass opacities series 6, image 22. Dependent subsegmental atelectasis in the lower lobes. No acute or focal airspace disease. No pleural fluid. Upper Abdomen: No acute findings. Musculoskeletal: There are no acute or suspicious osseous abnormalities. Review of the MIP images confirms the above findings. IMPRESSION: 1. Isolated subsegmental right upper lobe pulmonary embolus. 2. Possible small right apical pulmonary infarct with subpleural ground-glass opacities in the right upper lobe. Critical Value/emergent results were called by telephone at the time of interpretation on 12/11/2022 at 5:17 pm to [facility], who verbally acknowledged these results. Electronically Signed   By: MKeith RakeM.D.   On: 12/11/2022 17:17   DG Chest 2 View  Result Date: 12/11/2022 CLINICAL DATA:  Chest pain. EXAM: CHEST - 2 VIEW COMPARISON:  Chest x-ray April 2, 23. FINDINGS: The heart size and mediastinal contours are within normal limits. Both lungs are clear. No visible pleural effusions or pneumothorax. No acute osseous abnormality. IMPRESSION: No active cardiopulmonary disease. Electronically Signed   By: FMargaretha SheffieldM.D.   On: 12/11/2022 15:19        Scheduled Meds:  capsicum   Topical BID   ferrous sulfate  325 mg Oral Daily   hydrochlorothiazide  25 mg Oral Daily   predniSONE  40 mg Oral BID WC   Continuous Infusions:  heparin 1,650 Units/hr (12/12/22 2330)     LOS: 1 day    Time spent: 35 minutes.     BElmarie Shiley MD [facility]   If 7PM-7AM, please contact night-coverage [email or web address]  12/13/2022, 8:18 AM

## 2022-12-13 NOTE — Progress Notes (Signed)
ANTICOAGULATION CONSULT NOTE  Pharmacy Consult for Heparin infusion Indication: pulmonary embolus  No Known Allergies  Patient Measurements: Height: '5\' 10"'$  (177.8 cm) Weight: 126 kg (277 lb 12.5 oz) IBW/kg (Calculated) : 68.5 Heparin Dosing Weight: 97.7 kg  Vital Signs: Temp: 98.3 F (36.8 C) (12/15 0448) Temp Source: Oral (12/15 0448) BP: 137/100 (12/15 0448) Pulse Rate: 83 (12/15 0448)  Labs: Recent Labs    12/11/22 1530 12/11/22 1723 12/11/22 1723 12/12/22 0524 12/13/22 0304  HGB  --  12.1   < > 11.8* 12.7  HCT  --  37.1  --  36.3 38.8  PLT  --  PLATELET CLUMPS NOTED ON SMEAR, UNABLE TO ESTIMATE  --  225 241  HEPARINUNFRC  --   --   --  0.50 0.30  CREATININE 0.70  --   --   --  0.69  TROPONINIHS 5 5  --   --   --    < > = values in this interval not displayed.     Estimated Creatinine Clearance: 126.9 mL/min (by C-G formula based on SCr of 0.69 mg/dL).   Medical History: Past Medical History:  Diagnosis Date   ADHD (attention deficit hyperactivity disorder)    Adjustment disorder with mixed anxiety and depressed mood    Asthma    Fibroid tumor    Hypertension    Kidney stones    Microcytic anemia    Pulmonary emboli (HCC)     Medications:  Scheduled:   capsicum   Topical BID   ferrous sulfate  325 mg Oral Daily   hydrochlorothiazide  25 mg Oral Daily   predniSONE  40 mg Oral BID WC    Assessment: 46 yo F presents to ED with R leg cramping and intermittent L-sided chest pain x3 days. Pt has history of PE from 02/2017. Pt reports taking apixaban '5mg'$  BID, but discontinued it ~1 year ago without direction from physician. CT PE positive for subsegmental RUL PE.   Heparin level therapeutic on 1650 units/hr but at low end of the goal. CBC stable.  Goal of Therapy:  Heparin level 0.3-0.7 units/ml Monitor platelets by anticoagulation protocol: Yes   Plan:  Increase heparin drip slightly to 1700 units/hr Daily heparin level, CBC, s/s bleeding F/u  ability to transition to [place], PharmD, BCPS 12/13/2022 7:10 AM

## 2022-12-14 DIAGNOSIS — I2693 Single subsegmental pulmonary embolism without acute cor pulmonale: Secondary | ICD-10-CM | POA: Diagnosis not present

## 2022-12-14 LAB — HOMOCYSTEINE: Homocysteine: 7.5 umol/L (ref 0.0–14.5)

## 2022-12-14 LAB — BASIC METABOLIC PANEL
Anion gap: 10 (ref 5–15)
BUN: 11 mg/dL (ref 6–20)
CO2: 24 mmol/L (ref 22–32)
Calcium: 9.6 mg/dL (ref 8.9–10.3)
Chloride: 100 mmol/L (ref 98–111)
Creatinine, Ser: 0.82 mg/dL (ref 0.44–1.00)
GFR, Estimated: >60 mL/min (ref >60)
Glucose, Bld: 120 mg/dL — ABNORMAL HIGH (ref 70–99)
Potassium: 3.8 mmol/L (ref 3.5–5.1)
Sodium: 134 mmol/L — ABNORMAL LOW (ref 135–145)

## 2022-12-14 LAB — CBC
HCT: 37.3 % (ref 36.0–46.0)
Hemoglobin: 12.6 g/dL (ref 12.0–15.0)
MCH: 29.2 pg (ref 26.0–34.0)
MCHC: 33.8 g/dL (ref 30.0–36.0)
MCV: 86.5 fL (ref 80.0–100.0)
Platelets: 293 10*3/uL (ref 150–400)
RBC: 4.31 MIL/uL (ref 3.87–5.11)
RDW: 12.9 % (ref 11.5–15.5)
WBC: 15 10*3/uL — ABNORMAL HIGH (ref 4.0–10.5)
nRBC: 0 % (ref 0.0–0.2)

## 2022-12-14 LAB — HEPARIN LEVEL (UNFRACTIONATED): Heparin Unfractionated: 0.43 IU/mL (ref 0.30–0.70)

## 2022-12-14 MED ORDER — TRAMADOL HCL 50 MG PO TABS: 50.0000 mg | ORAL_TABLET | Freq: Four times a day (QID) | ORAL | 0 refills | Status: AC | PRN

## 2022-12-14 MED ORDER — APIXABAN 5 MG PO TABS: 5.0000 mg | ORAL_TABLET | Freq: Two times a day (BID) | ORAL | Status: DC

## 2022-12-14 MED ORDER — POLYETHYLENE GLYCOL 3350 17 G PO PACK: 17.0000 g | PACK | Freq: Two times a day (BID) | ORAL | 0 refills | Status: AC

## 2022-12-14 MED ORDER — APIXABAN 5 MG PO TABS
10.0000 mg | ORAL_TABLET | Freq: Two times a day (BID) | ORAL | Status: DC
  Administered 2022-12-14: 10 mg via ORAL
  Filled 2022-12-14: qty 2

## 2022-12-14 MED ORDER — APIXABAN 5 MG PO TABS: 10.0000 mg | ORAL_TABLET | Freq: Two times a day (BID) | ORAL | 0 refills | Status: AC

## 2022-12-14 MED ORDER — POLYETHYLENE GLYCOL 3350 17 G PO PACK
17.0000 g | PACK | Freq: Two times a day (BID) | ORAL | Status: DC
  Administered 2022-12-14: 17 g via ORAL
  Filled 2022-12-14: qty 1

## 2022-12-14 MED ORDER — METHOCARBAMOL 500 MG PO TABS: 500.0000 mg | ORAL_TABLET | Freq: Four times a day (QID) | ORAL | 0 refills | Status: AC | PRN

## 2022-12-14 MED ORDER — APIXABAN 5 MG PO TABS: 5.0000 mg | ORAL_TABLET | Freq: Two times a day (BID) | ORAL | 6 refills | Status: AC

## 2022-12-14 MED ORDER — PREDNISONE 20 MG PO TABS: ORAL_TABLET | ORAL | 0 refills | Status: AC

## 2022-12-14 NOTE — TOC Transition Note (Signed)
Transition of Care [facility]) - CM/SW Discharge Note   Patient Details  Name: Kristen Swanson MRN: [id number] Date of Birth: [birth year]  Transition of [facility]) CM/SW Contact:  Bartholomew Crews, RN Phone Number: [phone] 12/14/2022, 3:05 PM   Clinical Narrative:     Spoke with patient on hospital room phone to discuss post acute transition. Patient confirmed need for DME to include RW, BSC, and tub bench. Referral to [employer] for delivery to the room. Discussed recommendations for [facility] PT and what is involved. Patient agreeable. No preference for [facility] agency. Referral accepted by PT. Daughter to provide transportation home. No further TOC needs identified at this time.   Final next level of care: [place] Barriers to Discharge: No Barriers Identified   Patient Goals and CMS Choice Patient states their goals for this hospitalization and ongoing recovery are:: return home CMS Medicare.gov Compare Post Acute Care list provided to:: Patient Choice offered to / list presented to : Patient  Discharge Placement                       Discharge Plan and Services                DME Arranged: 3-N-1, Walker rolling, Tub bench DME Agency: [employer] Date DME Agency Contacted: 12/14/22 Time DME Agency Contacted: 1025 Representative spoke with at DME Agency: [place]: PT [place]: [facility] (now known as [facility]) Date [place]: 12/14/22 Time [employer]: [id number] Representative spoke with at [place]: [place] ([place]) Interventions     Readmission Risk Interventions     No data to display

## 2022-12-14 NOTE — Progress Notes (Signed)
Patient requires bedside commode in the home d/t inability to get to bathroom following debility r/t pulmonary embolism. There is not a bathroom in the room where she will be staying.   Manya Silvas, RN MSN CCM Transitions of Care - [place]  [phone]

## 2022-12-14 NOTE — Progress Notes (Signed)
Physical Therapy Treatment Patient Details Name: Kristen Swanson MRN: [id number] DOB: 1976/05/27 Today's Date: 12/14/2022   History of Present Illness 46 yo female with onset of RUL PE from medication non compliance was admitted and found R pop baker's cyst that was ruptured.  Has SIRS, low K+, low Na+, anemia, and R apical lung infarct.   PMHx:  PE, DVT, ADHD, morbid obesity,    PT Comments    Pt pleasant and eager to return home. Pt reporting continued posterior right knee pain but able to get to [facility] over toilet, walk in room and limited distance in hall and perform 4 stairs with assist and RW. Pt with improvement in gait and stairs with progressive trials and educated for sequence and safety. Pt assisted with donning knee sleeve and did report increased comfort with its use. Questions answered and pt reports appropriate assist at home for D/C.     Recommendations for follow up therapy are one component of a multi-disciplinary discharge planning process, led by the attending physician.  Recommendations may be updated based on patient status, additional functional criteria and insurance authorization.  Follow Up Recommendations  Home health PT     Assistance Recommended at Discharge Intermittent Supervision/Assistance  Patient can return home with the following A little help with bathing/dressing/bathroom;Assistance with cooking/housework;Assist for transportation;Help with stairs or ramp for entrance;A little help with walking and/or transfers   Equipment Recommendations  Rolling walker (2 wheels);BSC/3in1    Recommendations for Other Services       Precautions / Restrictions Precautions Precautions: Fall Precaution Comments: Posterior R knee pain Required Braces or Orthoses: Other Brace Other Brace: knee sleeve donned Rt     Mobility  Bed Mobility               General bed mobility comments: standing in bathroom on arrival, chair end of session    Transfers Overall  transfer level: Needs assistance   Transfers: Sit to/from Stand Sit to Stand: Min guard           General transfer comment: cues for hand placement and safety with increased time and effort    Ambulation/Gait Ambulation/Gait assistance: Min guard Gait Distance (Feet): 30 Feet Assistive device: Rolling walker (2 wheels) Gait Pattern/deviations: Step-to pattern   Gait velocity interpretation: <1.8 ft/sec, indicate of risk for recurrent falls   General Gait Details: cues for posture, proximity to RW and sequence. pt walked 5', 10', 15', 30' respectively with seated rest between each trial   Stairs Stairs: Yes Stairs assistance: Min assist Stair Management: Backwards, Step to pattern, With walker Number of Stairs: 4 General stair comments: pt performed 4 stairs ascending/descending backward with RW with assist to control and stabilize RW. handout provided   Wheelchair Mobility    Modified Rankin (Stroke Patients Only)       Balance Overall balance assessment: Needs assistance   Sitting balance-Leahy Scale: Good     Standing balance support: Bilateral upper extremity supported, During functional activity, Reliant on assistive device for balance Standing balance-Leahy Scale: Poor                              Cognition Arousal/Alertness: Awake/alert Behavior During Therapy: WFL for tasks assessed/performed Overall Cognitive Status: Within Functional Limits for tasks assessed  Exercises      General Comments        Pertinent Vitals/Pain Pain Assessment Faces Pain Scale: Hurts even more Pain Location: R knee with movement and standing Pain Descriptors / Indicators: Aching, Grimacing, Tender Pain Intervention(s): Limited activity within patient's tolerance, Repositioned, Monitored during session    Home Living                          Prior Function            PT Goals  (current goals can now be found in the care plan section) Progress towards PT goals: Progressing toward goals    Frequency    Min 3X/week      PT Plan Current plan remains appropriate    Co-evaluation              AM-PAC PT "6 Clicks" Mobility   Outcome Measure  Help needed turning from your back to your side while in a flat bed without using bedrails?: A Little Help needed moving from lying on your back to sitting on the side of a flat bed without using bedrails?: A Little Help needed moving to and from a bed to a chair (including a wheelchair)?: A Little Help needed standing up from a chair using your arms (e.g., wheelchair or bedside chair)?: A Little Help needed to walk in hospital room?: A Little Help needed climbing 3-5 steps with a railing? : A Little 6 Click Score: 18    End of Session   Activity Tolerance: Patient tolerated treatment well Patient left: in chair;with call bell/phone within reach Nurse Communication: Mobility status PT Visit Diagnosis: Unsteadiness on feet (R26.81);Repeated falls (R29.6);Muscle weakness (generalized) (M62.81);Pain     Time: 1610-9604 PT Time Calculation (min) (ACUTE ONLY): 47 min  Charges:  $Gait Training: 23-37 mins $Therapeutic Activity: 8-22 mins                     Bayard Males, PT Acute Rehabilitation Services Office: [place] 12/14/2022, 2:07 PM

## 2022-12-14 NOTE — Progress Notes (Signed)
[facility] for transition to apixaban from heparin Indication: pulmonary embolus  No Known Allergies  Patient Measurements: Height: '5\' 10"'$  (177.8 cm) Weight: 126 kg (277 lb 12.5 oz) IBW/kg (Calculated) : 68.5 Heparin Dosing Weight: 97.7 kg  Vital Signs: Temp: 97.8 F (36.6 C) (12/16 0753) Temp Source: Oral (12/16 0753) BP: 116/76 (12/16 0753) Pulse Rate: 76 (12/16 0753)  Labs: Recent Labs    12/11/22 1530 12/11/22 1723 12/11/22 1723 12/12/22 0524 12/13/22 0304 12/14/22 0221  HGB  --  12.1   < > 11.8* 12.7 12.6  HCT  --  37.1   < > 36.3 38.8 37.3  PLT  --  PLATELET CLUMPS NOTED ON SMEAR, UNABLE TO ESTIMATE   < > 225 241 293  HEPARINUNFRC  --   --   --  0.50 0.30 0.43  CREATININE 0.70  --   --   --  0.69  --   TROPONINIHS 5 5  --   --   --   --    < > = values in this interval not displayed.     Estimated Creatinine Clearance: 126.9 mL/min (by C-G formula based on SCr of 0.69 mg/dL).   Medical History: Past Medical History:  Diagnosis Date   ADHD (attention deficit hyperactivity disorder)    Adjustment disorder with mixed anxiety and depressed mood    Asthma    Fibroid tumor    Hypertension    Kidney stones    Microcytic anemia    Pulmonary emboli (HCC)     Medications:  Scheduled:   capsicum   Topical BID   ferrous sulfate  325 mg Oral Daily   predniSONE  40 mg Oral Q breakfast    Assessment: 46 yo F presents to ED with R leg cramping and intermittent L-sided chest pain x3 days. Pt has history of PE from 02/2017. Pt reports taking apixaban '5mg'$  BID, but discontinued it ~1 year ago without direction from physician. CT PE positive for subsegmental RUL PE.   Heparin has been therapeutic for 3 days and given active PE, will provide 7 days of 10 mg BID apixaban for VTE dosing before transitioning to 5 mg BID thereafter.  Goal of Therapy:  Monitor platelets by anticoagulation protocol: Yes   Plan:  Stop heparin drip  and start apixaban at the same time Start apixaban 10 mg BID x 7 days, then transition to 5 mg BID Daily CBC, s/s bleeding  Varney Daily, PharmD PGY2 Pharmacy Resident 12/14/2022 7:58 AM

## 2022-12-14 NOTE — Progress Notes (Signed)
[name] to be D/C'd  per MD order.  Discussed with the patient and all questions fully answered.  VSS, Skin clean, dry and intact without evidence of skin break down, no evidence of skin tears noted.  IV catheter discontinued intact. Site without signs and symptoms of complications. Dressing and pressure applied.  An After Visit Summary was printed and given to the patient. Patient received prescription.  D/c education completed with patient/family including follow up instructions, medication list, d/c activities limitations if indicated, with other d/c instructions as indicated by MD - patient able to verbalize understanding, all questions fully answered.   Patient instructed to return to ED, call 911, or call MD for any changes in condition.   Patient to be escorted via [place], and D/C home via private auto.

## 2022-12-14 NOTE — Discharge Summary (Signed)
Physician Discharge Summary   Patient: Kristen Swanson MRN: [id number] DOB: 1976-12-13  Admit date:     12/11/2022  Discharge date: 12/14/22  Discharge Physician: Elmarie Shiley   PCP: Mancel Bale, PA-C   Recommendations at discharge:    Needs to follow up with Dr Doreatha Martin for further care of baker cyst, and knee effusion.  Follow up Hypercoagulable panel.  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 Needs referral to hematology   Discharge Diagnoses: Principal Problem:   Acute pulmonary embolism ([place]) Active Problems:   Pulmonary emboli ([place])   Baker's cyst   Fever  Resolved Problems:   * No resolved hospital problems. *  Hospital Course: 46 year old with past medical history significant for DVT/PE compliant with Eliquis, hypertension, chronic thyroid deficiency anemia, ADHD, morbid obesity presented with sudden onset of right knee pain, left side chest pain for the last 2 days.   Past medical history significant for DVT and PE diagnosed 2018 and has been on Eliquis.  3 months ago patient stopped taking Eliquis by her own.    Evaluation in the ED she was found to have low-grade fever, CT angio show acute right upper lobe segmental PE with apical infarct.  Doppler lower extremity showed large Baker's cyst in her right popliteal area.     Assessment and Plan: 1-Right upper lobe subsegmental PE and Right apical lung infarct: Noncompliant with medications Treated with heparin gtt. Transition to Eliquis today.  Echo: 51-70 % RV systolic Function is normal.    Acute ambulation dysfunction Right popliteal Baker's cyst: Orthopedic consulted.  Started on prednisone Discharge on prednisone taper dose She will need to follow up with ortho for baker cyst aspiration and aspiration of knee effusion.  She will need out of work for 1--2 weeks.   SIRS: Sepsis  rule out No further fever.  Suspect related to PE  Hypokalemia. Replaced.  Mg  normal.    Hyponatremia;  On fluids.  Headache; tylenol PRN.    Chronic iron deficiency anemia.  On iron   Morbid Obesity:  Life style modification.      Estimated body mass index is 39.86 kg/m as calculated from the following:   Height as of this encounter: '5\' 10"'$  (1.778 m).   Weight as of this encounter: 126 kg.          Consultants: Dr Marcelino Scot  Procedures performed: None Disposition: Home Diet recommendation:  Discharge Diet Orders (From admission, onward)     Start     Ordered   12/14/22 0000  Diet - low sodium heart healthy        12/14/22 1439           Cardiac diet DISCHARGE MEDICATION: Allergies as of 12/14/2022   No Known Allergies      Medication List     STOP taking these medications    cyclobenzaprine 10 MG tablet Commonly known as: FLEXERIL   metroNIDAZOLE 0.75 % vaginal gel Commonly known as: METROGEL       TAKE these medications    acetaminophen 500 MG tablet Commonly known as: TYLENOL Take 2 tablets (1,000 mg total) by mouth every 6 (six) hours as needed for moderate pain.   albuterol 108 (90 Base) MCG/ACT inhaler Commonly known as: VENTOLIN HFA Inhale 1-2 puffs into the lungs every 6 (six) hours as needed for wheezing or shortness of breath.   apixaban 5 MG Tabs tablet Commonly known as: ELIQUIS Take 2  tablets (10 mg total) by mouth 2 (two) times daily for 7 days. What changed: how much to take   apixaban 5 MG Tabs tablet Commonly known as: ELIQUIS Take 1 tablet (5 mg total) by mouth 2 (two) times daily. Start taking on: December 21, 2022 What changed: You were already taking a medication with the same name, and this prescription was added. Make sure you understand how and when to take each.   ferrous sulfate 325 (65 FE) MG tablet Take 1 tablet (325 mg total) by mouth daily.   hydrochlorothiazide 25 MG tablet Commonly known as: HYDRODIURIL Take 1 tablet (25 mg total) by mouth daily.   methocarbamol 500 MG  tablet Commonly known as: ROBAXIN Take 1 tablet (500 mg total) by mouth every 6 (six) hours as needed for muscle spasms.   ondansetron 4 MG disintegrating tablet Commonly known as: Zofran ODT Take 1 tablet (4 mg total) by mouth every 8 (eight) hours as needed for nausea or vomiting.   polyethylene glycol 17 g packet Commonly known as: MIRALAX / GLYCOLAX Take 17 g by mouth 2 (two) times daily.   potassium chloride 10 MEQ tablet Commonly known as: KLOR-CON Take 1 tablet (10 mEq total) by mouth daily.   predniSONE 20 MG tablet Commonly known as: DELTASONE Take 40 mg by mouth for 3 days then 30 mg daily for 2 days then 20 mg daily for 2 days then 10 mg for 3 days. Then stop. Start taking on: December 15, 2022   traMADol 50 MG tablet Commonly known as: ULTRAM Take 1 tablet (50 mg total) by mouth every 6 (six) hours as needed for up to 5 days. What changed: See the new instructions.   Vitamin D (Ergocalciferol) 1.25 MG (50000 UNIT) Caps capsule Commonly known as: DRISDOL Take 50,000 Units by mouth every 7 (seven) days. [place]  (From admission, onward)           Start     Ordered   12/14/22 1459  For home use only DME Tub bench  Once        12/14/22 1501   12/14/22 1440  For home use only DME 3 n 1  Once        12/14/22 1439   12/14/22 1440  For home use only DME Walker rolling  Once       Question Answer Comment  Walker: With [employer] Wheels   Patient needs a walker to treat with the following condition Knee pain      12/14/22 1439            Follow-up Information     [place] [place] Follow up.   Specialty: Home Health Services Why: Agency name is [facility] [owned by [employer] information: [place] [id number] [phone]                Discharge Exam: Danley Danker Weights   12/11/22 2200  Weight: 126 kg   General; NAD  Condition at discharge:  stable  The results of significant diagnostics from this hospitalization (including imaging, microbiology, ancillary and laboratory) are listed below for reference.   Imaging Studies: MR KNEE RIGHT WO CONTRAST  Result Date: 12/13/2022 CLINICAL DATA:  Knee pain, chronic degenerative disease. EXAM: MRI OF THE RIGHT KNEE WITHOUT CONTRAST TECHNIQUE: Multiplanar, multisequence MR imaging of the right knee was performed. No intravenous  contrast was administered. COMPARISON:  Radiographs dated December 13, 2022 FINDINGS: MENISCI Medial: Intact. Lateral: Intact. LIGAMENTS Cruciates: ACL and PCL are intact. Collaterals: Edema along the medial collateral ligament suggesting ligamentous sprain without tear. Lateral collateral ligament complex is intact. CARTILAGE Patellofemoral: Full-thickness cartilage defect at the patellar apex and lateral patellar facet with subchondral cystic changes. Full-thickness cartilage defect and subchondral cystic changes of the lateral trochlea. Medial: Mild partial-thickness cartilage loss of the medial femorotibial compartment. Lateral:  No chondral defect. JOINT: Large joint effusion. Normal Hoffa's fat-pad. Mild plical thickening. POPLITEAL FOSSA: Popliteus tendon is intact. Large Baker's cyst measuring approximately 3.2 x 2.2 x 4.7 cm. EXTENSOR MECHANISM: Intact quadriceps tendon. Intact patellar tendon. Intact lateral patellar retinaculum. Intact medial patellar retinaculum. Intact MPFL. BONES: No aggressive osseous lesion. No fracture or dislocation. Other: No fluid collection or hematoma. Muscles are normal. IMPRESSION: 1. Medial and lateral menisci are intact. 2. Edema along the medial collateral ligament suggesting ligamentous sprain without tear. 3. Moderate patellofemoral osteoarthritis with full-thickness cartilage defect and subchondral cystic changes of the lateral patellar facet and lateral trochlea. 4. Large knee joint effusion. 5. Large Baker's cyst measuring  approximately 3.2 x 2.2 x 4.7 cm. 6. No evidence of fracture or osteonecrosis. Electronically Signed   By: Keane Police D.O.   On: 12/13/2022 22:34   DG Knee Right Port  Result Date: 12/13/2022 CLINICAL DATA:  Pain EXAM: PORTABLE RIGHT KNEE - 1-2 VIEW COMPARISON:  None Available. FINDINGS: There is no evidence of acute fracture. 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 There is a small joint effusion. IMPRESSION: No acute osseous abnormality.  Small joint effusion. Mild medial and patellofemoral predominant osteoarthritis. Electronically Signed   By: Maurine Simmering M.D.   On: 12/13/2022 16:36   ECHOCARDIOGRAM COMPLETE  Result Date: 12/12/2022    ECHOCARDIOGRAM REPORT   Patient Name:   TEXANNA HILBURN Date of Exam: 12/12/2022 Medical Rec #:  [id number]   Height:       70.0 in Accession #:    [id number]  Weight:       277.8 lb Date of Birth:  [birth year]   BSA:          2.400 m Patient Age:    84 years    BP:           112/88 mmHg Patient Gender: F           HR:           92 bpm. Exam Location:  Inpatient Procedure: 2D Echo, Cardiac Doppler and Color Doppler Indications:    Pulmonary Embolus I26.09  History:        Patient has prior history of Echocardiogram examinations, most                 recent 03/20/2017. Risk Factors:Hypertension.  Sonographer:    Darlina Sicilian RDCS Referring Phys: [id number] Lequita Halt  Sonographer Comments: Technically challenging study limited acoustic windows due to limited mobility. Patient unable to turn for echo due to leg pain. IMPRESSIONS  1. Left ventricular ejection fraction, by estimation, is 55 to 60%. The left ventricle has normal function. The left ventricle has no regional wall motion abnormalities. Left ventricular diastolic function could not be evaluated.  2. Right ventricular systolic function is normal. The right ventricular size is normal. Tricuspid regurgitation signal is inadequate for assessing PA pressure.  3. The mitral valve is normal in  structure. No evidence of mitral valve regurgitation. No evidence of mitral stenosis.  4. The aortic valve is normal in structure. Aortic valve regurgitation is not visualized. No aortic stenosis is present.  5. The inferior vena cava is normal in size with greater than 50% respiratory variability, suggesting right atrial pressure of 3 mmHg. FINDINGS  Left Ventricle: Left ventricular ejection fraction, by estimation, is 55 to 60%. The left ventricle has normal function. The left ventricle has no regional wall motion abnormalities. The left ventricular internal cavity size was normal in size. There is  no left ventricular hypertrophy. Left ventricular diastolic function could not be evaluated. Right Ventricle: The right ventricular size is normal. No increase in right ventricular wall thickness. Right ventricular systolic function is normal. Tricuspid regurgitation signal is inadequate for assessing PA pressure. Left Atrium: Left atrial size was normal in size. Right Atrium: Right atrial size was normal in size. Pericardium: There is no evidence of pericardial effusion. Mitral Valve: The mitral valve is normal in structure. No evidence of mitral valve regurgitation. No evidence of mitral valve stenosis. Tricuspid Valve: The tricuspid valve is normal in structure. Tricuspid valve regurgitation is not demonstrated. No evidence of tricuspid stenosis. Aortic Valve: The aortic valve is normal in structure. Aortic valve regurgitation is not visualized. No aortic stenosis is present. Pulmonic Valve: The pulmonic valve was normal in structure. Pulmonic valve regurgitation is trivial. No evidence of pulmonic stenosis. Aorta: The aortic root and ascending aorta are structurally normal, with no evidence of dilitation. Venous: The inferior vena cava is normal in size with greater than 50% respiratory variability, suggesting right atrial pressure of 3 mmHg. IAS/Shunts: No atrial level shunt detected by color flow Doppler.  LEFT  VENTRICLE PLAX 2D LVIDd:         4.90 cm   Diastology LVIDs:         3.00 cm   LV e' medial:  8.70 cm/s LV PW:         0.70 cm   LV e' lateral: 8.16 cm/s LV IVS:        1.00 cm LVOT diam:     2.20 cm LV SV:         64 LV SV Index:   27 LVOT Area:     3.80 cm  LEFT ATRIUM             Index LA diam:        4.20 cm 1.75 cm/m LA Vol (A2C):   46.4 ml 19.33 ml/m LA Vol (A4C):   44.6 ml 18.58 ml/m LA Biplane Vol: 47.1 ml 19.62 ml/m  AORTIC VALVE LVOT Vmax:   85.80 cm/s LVOT Vmean:  65.700 cm/s LVOT VTI:    0.168 m  AORTA Ao Root diam: 2.70 cm Ao Asc diam:  2.60 cm  SHUNTS Systemic VTI:  0.17 m Systemic Diam: 2.20 cm Fransico Him MD Electronically signed by Fransico Him MD Signature Date/Time: 12/12/2022/2:14:43 PM    Final    CT Head Wo Contrast  Result Date: 12/12/2022 CLINICAL DATA:  Headache EXAM: CT HEAD WITHOUT CONTRAST TECHNIQUE: Contiguous axial images were obtained from the base of the skull through the vertex without intravenous contrast. RADIATION DOSE REDUCTION: This exam was performed according to the departmental dose-optimization program which includes automated exposure control, adjustment of the mA and/or kV according to patient size and/or use of iterative reconstruction technique. COMPARISON:  10/11/2016 FINDINGS: Brain: No evidence of acute infarction, hemorrhage, hydrocephalus, extra-axial collection or mass lesion/mass effect. Vascular: No hyperdense vessel or unexpected calcification. Skull: Normal. Negative for fracture or  focal lesion. Sinuses/Orbits: No acute finding. IMPRESSION: Negative head CT. Electronically Signed   By: Jorje Guild M.D.   On: 12/12/2022 04:18   US Venous Img Lower Unilateral Right  Result Date: 12/11/2022 CLINICAL DATA:  Right lower extremity edema. EXAM: Right LOWER EXTREMITY VENOUS DOPPLER ULTRASOUND TECHNIQUE: Gray-scale sonography with compression, as well as color and duplex ultrasound, were performed to evaluate the deep venous system(s) from the level  of the common femoral vein through the popliteal and proximal calf veins. COMPARISON:  None Available. FINDINGS: VENOUS Normal compressibility of the common femoral, superficial femoral, and popliteal veins, as well as the visualized calf veins. Visualized portions of profunda femoral vein and great saphenous vein unremarkable. No filling defects to suggest DVT on grayscale or color Doppler imaging. Doppler waveforms show normal direction of venous flow, normal respiratory plasticity and response to augmentation. Limited views of the contralateral common femoral vein are unremarkable. OTHER Probable Baker's cyst seen in right popliteal fossa. Limitations: none IMPRESSION: No definite evidence of deep venous thrombosis seen in right lower extremity. Electronically Signed   By: Marijo Conception M.D.   On: 12/11/2022 18:04   CT Angio Chest PE W and/or Wo Contrast  Result Date: 12/11/2022 CLINICAL DATA:  Pulmonary embolism (PE) suspected, high prob Chest pain. History of pulmonary embolus. EXAM: CT ANGIOGRAPHY CHEST WITH CONTRAST TECHNIQUE: Multidetector CT imaging of the chest was performed using the standard protocol during bolus administration of intravenous contrast. Multiplanar CT image reconstructions and MIPs were obtained to evaluate the vascular anatomy. RADIATION DOSE REDUCTION: This exam was performed according to the departmental dose-optimization program which includes automated exposure control, adjustment of the mA and/or kV according to patient size and/or use of iterative reconstruction technique. CONTRAST:  143m OMNIPAQUE IOHEXOL 350 MG/ML SOLN COMPARISON:  Radiograph earlier today. Most recent chest CTA 03/31/2022 FINDINGS: Cardiovascular: Contrast bolus timing is suboptimal. Isolated subsegmental filling defect in the right upper lobe pulmonary artery series 4, image 38-39. The thoracic aorta is normal in caliber. No acute aortic findings. Heart is upper normal in size. No pericardial effusion.  Mediastinum/Nodes: No enlarged mediastinal or hilar lymph nodes. Mild bilateral axillary and subpectoral adenopathy is chronic and has been present on prior exams. Small hiatal hernia. Lungs/Pleura: Possible right apical small pulmonary infarct with subpleural ground-glass opacities series 6, image 22. Dependent subsegmental atelectasis in the lower lobes. No acute or focal airspace disease. No pleural fluid. Upper Abdomen: No acute findings. Musculoskeletal: There are no acute or suspicious osseous abnormalities. Review of the MIP images confirms the above findings. IMPRESSION: 1. Isolated subsegmental right upper lobe pulmonary embolus. 2. Possible small right apical pulmonary infarct with subpleural ground-glass opacities in the right upper lobe. Critical Value/emergent results were called by telephone at the time of interpretation on 12/11/2022 at 5:17 pm to [facility], who verbally acknowledged these results. Electronically Signed   By: MKeith RakeM.D.   On: 12/11/2022 17:17   DG Chest 2 View  Result Date: 12/11/2022 CLINICAL DATA:  Chest pain. EXAM: CHEST - 2 VIEW COMPARISON:  Chest x-ray April 2, 23. FINDINGS: The heart size and mediastinal contours are within normal limits. Both lungs are clear. No visible pleural effusions or pneumothorax. No acute osseous abnormality. IMPRESSION: No active cardiopulmonary disease. Electronically Signed   By: FMargaretha SheffieldM.D.   On: 12/11/2022 15:19    Microbiology: Results for orders placed or performed during the hospital encounter of 12/11/22  Resp panel by RT-PCR (RSV, Flu A&B,  Covid) Anterior Nasal Swab     Status: None   Collection Time: 12/11/22  3:50 PM   Specimen: Anterior Nasal Swab  Result Value Ref Range Status   SARS Coronavirus 2 by RT PCR NEGATIVE NEGATIVE Final    Comment: (NOTE) SARS-CoV-2 target nucleic acids are NOT DETECTED.  The SARS-CoV-2 RNA is generally detectable in upper respiratory specimens during the acute  phase of infection. The lowest concentration of SARS-CoV-2 viral copies this assay can detect is 138 copies/mL. A negative result does not preclude SARS-Cov-2 infection and should not be used as the sole basis for treatment or other patient management decisions. A negative result may occur with  improper specimen collection/handling, submission of specimen other than nasopharyngeal swab, presence of viral mutation(s) within the areas targeted by this assay, and inadequate number of viral copies(<138 copies/mL). A negative result must be combined with clinical observations, patient history, and epidemiological information. The expected result is Negative.  Fact Sheet for Patients:  EntrepreneurPulse.com.au  Fact Sheet for Healthcare Providers:  IncredibleEmployment.be  This test is no t yet approved or cleared by the [place] FDA and  has been authorized for detection and/or diagnosis of SARS-CoV-2 by FDA under an Emergency Use Authorization (EUA). This EUA will remain  in effect (meaning this test can be used) for the duration of the COVID-19 declaration under Section 564(b)(1) of the Act, 21 U.S.C.section 360bbb-3(b)(1), unless the authorization is terminated  or revoked sooner.       Influenza A by PCR NEGATIVE NEGATIVE Final   Influenza B by PCR NEGATIVE NEGATIVE Final    Comment: (NOTE) The Xpert Xpress SARS-CoV-2/FLU/RSV plus assay is intended as an aid in the diagnosis of influenza from Nasopharyngeal swab specimens and should not be used as a sole basis for treatment. Nasal washings and aspirates are unacceptable for Xpert Xpress SARS-CoV-2/FLU/RSV testing.  Fact Sheet for Patients: EntrepreneurPulse.com.au  Fact Sheet for Healthcare Providers: IncredibleEmployment.be  This test is not yet approved or cleared by the [place] FDA and has been authorized for detection and/or diagnosis of  SARS-CoV-2 by FDA under an Emergency Use Authorization (EUA). This EUA will remain in effect (meaning this test can be used) for the duration of the COVID-19 declaration under Section 564(b)(1) of the Act, 21 U.S.C. section 360bbb-3(b)(1), unless the authorization is terminated or revoked.     Resp Syncytial Virus by PCR NEGATIVE NEGATIVE Final    Comment: (NOTE) Fact Sheet for Patients: EntrepreneurPulse.com.au  Fact Sheet for Healthcare Providers: IncredibleEmployment.be  This test is not yet approved or cleared by the [place] FDA and has been authorized for detection and/or diagnosis of SARS-CoV-2 by FDA under an Emergency Use Authorization (EUA). This EUA will remain in effect (meaning this test can be used) for the duration of the COVID-19 declaration under Section 564(b)(1) of the Act, 21 U.S.C. section 360bbb-3(b)(1), unless the authorization is terminated or revoked.  Performed at [facility], [address] [place],  [address]     Labs: CBC: Recent Labs  Lab 12/11/22 1723 12/12/22 0524 12/13/22 0304 12/14/22 0221  WBC 5.5 7.6 8.6 15.0*  HGB 12.1 11.8* 12.7 12.6  HCT 37.1 36.3 38.8 37.3  MCV 87.3 87.1 87.2 86.5  PLT PLATELET CLUMPS NOTED ON SMEAR, UNABLE TO ESTIMATE 225 241 194   Basic Metabolic Panel: Recent Labs  Lab 12/11/22 1530 12/12/22 1147 12/13/22 0304 12/14/22 0216  NA 135  --  132* 134*  K 3.0* 3.1* 3.1* 3.8  CL 102  --  98 100  CO2 24  --  24 24  GLUCOSE 118*  --  155* 120*  BUN 5*  --  5* 11  CREATININE 0.70  --  0.69 0.82  CALCIUM 9.6  --  9.4 9.6  MG  --   --  2.2  --    Liver Function Tests: No results for input(s): "AST", "ALT", "ALKPHOS", "BILITOT", "PROT", "ALBUMIN" in the last 168 hours. CBG: No results for input(s): "GLUCAP" in the last 168 hours.  Discharge time spent: greater than 30 minutes.  Signed: Elmarie Shiley, MD [facility] 12/14/2022

## 2022-12-14 NOTE — Plan of Care (Signed)

## 2022-12-14 NOTE — Progress Notes (Addendum)
Patient seen and examined.  She states she is starting to feel better 2 days with the start of the prednisone.  She is able to move her knee much more than she did yesterday that she still gets pain with weightbearing.  She had a knot on her calf yesterday and this seems to have gone away.  She is known to both orthopedic trauma service due to history of surgeries with Dr. Marcelino Scot as well as to [facility] Ip she has been seen by Dr. French Ana in the past.  She is able to perform 0 to 90 degrees of flexion while in the bed today.  Positive DP pulse.  Dorsiflexion plantarflexion intact at the ankle.  Nontender to the posterior proximal calf today.  Right Knee MRI showing effusion with Baker's cyst measuring 3.2 x 2.2 x 4.7 cm, Moderate patellofemoral osteoarthritis with full-thickness cartilage defect and subchondral cystic changes of the lateral patellar facet and lateral trochlea.   Will continue with current plan.  I think the best option regarding her effusion and Baker's cyst would be to set her up for an outpatient ultrasound-guided arthrocentesis with possible cortisone intra-articular injection, especially due to her body habitus. I would recommend continuing prednisone, ice therapy, and starting compression therapy. I have ordered her both a knee sleeve as well as thigh high ted hose, she can use either of these as a compression option. She will plan to work with PT on stair training prior to returning home. Also ok to continue tramadol and muscle relaxer as needed. Ok to stay out of work 1-2 weeks from ortho standpoint until knee pain resolves.

## 2022-12-14 NOTE — Progress Notes (Signed)
Orthopedic Tech Progress Note Patient Details:  Kristen Swanson 12-08-1976 [id number] L and XL knee sleeves were delivered to this patient's room.  Ortho Devices Type of Ortho Device: Knee Sleeve Ortho Device/Splint Interventions: Ordered      Kristen Swanson E Karstyn Birkey 12/14/2022, 1:12 PM

## 2022-12-15 LAB — LUPUS ANTICOAGULANT PANEL
DRVVT: 30.4 s (ref 0.0–47.0)
PTT Lupus Anticoagulant: 37.7 s (ref 0.0–43.5)

## 2022-12-16 LAB — CARDIOLIPIN ANTIBODIES, IGG, IGM, IGA
Anticardiolipin IgA: <9 APL U/mL (ref 0–11)
Anticardiolipin IgG: <9 GPL U/mL (ref 0–14)
Anticardiolipin IgM: <9 MPL U/mL (ref 0–12)

## 2022-12-19 LAB — PROTHROMBIN GENE MUTATION

## 2022-12-24 LAB — FACTOR 5 LEIDEN

## 2023-05-11 ENCOUNTER — Emergency Department (HOSPITAL_BASED_OUTPATIENT_CLINIC_OR_DEPARTMENT_OTHER): Payer: 59

## 2023-05-11 ENCOUNTER — Other Ambulatory Visit: Payer: Self-pay

## 2023-05-11 ENCOUNTER — Encounter (HOSPITAL_BASED_OUTPATIENT_CLINIC_OR_DEPARTMENT_OTHER): Payer: Self-pay | Admitting: Emergency Medicine

## 2023-05-11 ENCOUNTER — Emergency Department (HOSPITAL_BASED_OUTPATIENT_CLINIC_OR_DEPARTMENT_OTHER)
Admission: EM | Admit: 2023-05-11 | Discharge: 2023-05-11 | Disposition: A | Discharge status: Home / Self Care | Payer: 59 | Attending: Emergency Medicine | Admitting: Emergency Medicine

## 2023-05-11 DIAGNOSIS — M79631 Pain in right forearm: Secondary | ICD-10-CM | POA: Insufficient documentation

## 2023-05-11 DIAGNOSIS — Z7901 Long term (current) use of anticoagulants: Secondary | ICD-10-CM | POA: Insufficient documentation

## 2023-05-11 DIAGNOSIS — M79662 Pain in left lower leg: Secondary | ICD-10-CM | POA: Insufficient documentation

## 2023-05-11 DIAGNOSIS — W19XXXA Unspecified fall, initial encounter: Secondary | ICD-10-CM

## 2023-05-11 DIAGNOSIS — M79661 Pain in right lower leg: Secondary | ICD-10-CM | POA: Diagnosis not present

## 2023-05-11 DIAGNOSIS — W108XXA Fall (on) (from) other stairs and steps, initial encounter: Secondary | ICD-10-CM | POA: Insufficient documentation

## 2023-05-11 DIAGNOSIS — M79645 Pain in left finger(s): Secondary | ICD-10-CM | POA: Diagnosis present

## 2023-05-11 DIAGNOSIS — M7989 Other specified soft tissue disorders: Secondary | ICD-10-CM | POA: Insufficient documentation

## 2023-05-11 NOTE — ED Triage Notes (Signed)
Fell down 3 steps, landed frontwards, right elbow, left thumb, bilateral calves are sore.

## 2023-05-11 NOTE — Discharge Instructions (Signed)
Evaluation for your fall was overall reassuring.  X-rays were negative.  Recommend you follow-up with your PCP.

## 2023-05-11 NOTE — ED Triage Notes (Signed)
Pt is on eliquis 

## 2023-05-11 NOTE — ED Provider Notes (Signed)
[place] EMERGENCY DEPARTMENT AT [facility] Provider Note   CSN: [id number] Arrival date & time: 05/11/23  1844     History  Chief Complaint  Patient presents with   fall on thinners   HPI Kristen Swanson is a 47 y.o. female with history of PE December 2023 on Eliquis presenting for fall yesterday.  Patient was walking down steps tripped and fell from the third step to the ground landing on both hands and the "push-up position".  Denies head injury or loss of consciousness.  Since then she has had pain and swelling in her left thumb, right forearm, also endorsed soreness in both calves.  Denies chest pain shortness of breath.  Took 1000 mg of Tylenol this evening.  Patient denies shortness of breath, lightheadedness, fatigue.  HPI     Home Medications Prior to Admission medications   Medication Sig Start Date End Date Taking? Authorizing Provider  acetaminophen (TYLENOL) 500 MG tablet Take 2 tablets (1,000 mg total) by mouth every 6 (six) hours as needed for moderate pain. 03/17/22   Fayrene Helper, PA-C  albuterol (VENTOLIN HFA) 108 (90 Base) MCG/ACT inhaler Inhale 1-2 puffs into the lungs every 6 (six) hours as needed for wheezing or shortness of breath. 03/31/22   Al Decant, PA-C  apixaban (ELIQUIS) 5 MG TABS tablet Take 2 tablets (10 mg total) by mouth 2 (two) times daily for 7 days. 12/14/22 12/21/22  Regalado, Belkys A, MD  apixaban (ELIQUIS) 5 MG TABS tablet Take 1 tablet (5 mg total) by mouth 2 (two) times daily. 12/21/22   Regalado, Belkys A, MD  ferrous sulfate 325 (65 FE) MG tablet Take 1 tablet (325 mg total) by mouth daily. 10/03/19   Jacalyn Lefevre, MD  hydrochlorothiazide (HYDRODIURIL) 25 MG tablet Take 1 tablet (25 mg total) by mouth daily. 08/25/17   Eulah Pont, MD  methocarbamol (ROBAXIN) 500 MG tablet Take 1 tablet (500 mg total) by mouth every 6 (six) hours as needed for muscle spasms. 12/14/22   Regalado, Belkys A, MD  ondansetron (ZOFRAN ODT) 4 MG  disintegrating tablet Take 1 tablet (4 mg total) by mouth every 8 (eight) hours as needed for nausea or vomiting. 02/26/19   Henderly, Britni A, PA-C  polyethylene glycol (MIRALAX / GLYCOLAX) 17 g packet Take 17 g by mouth 2 (two) times daily. 12/14/22   Regalado, Belkys A, MD  potassium chloride (KLOR-CON) 10 MEQ tablet Take 1 tablet (10 mEq total) by mouth daily. 10/03/19   Jacalyn Lefevre, MD  predniSONE (DELTASONE) 20 MG tablet Take 40 mg by mouth for 3 days then 30 mg daily for 2 days then 20 mg daily for 2 days then 10 mg for 3 days. Then stop. 12/15/22   Regalado, Belkys A, MD  Vitamin D, Ergocalciferol, (DRISDOL) 1.25 MG (50000 UNIT) CAPS capsule Take 50,000 Units by mouth every 7 (seven) days. saturdays 01/24/22   [provider]  dicyclomine (BENTYL) 20 MG tablet Take 1 tablet (20 mg total) by mouth 2 (two) times daily. 02/26/19 01/31/21  Henderly, Britni A, PA-C      Allergies    Patient has no known allergies.    Review of Systems   See HPI for pertinent positives   Physical Exam   Vitals:   05/11/23 1855  BP: 135/80  Pulse: 85  Resp: 20  Temp: 98.1 F (36.7 C)  SpO2: 97%    CONSTITUTIONAL:  well-appearing, NAD NEURO:  Alert and oriented x 3, CN 3-12 grossly  intact EYES:  eyes equal and reactive ENT/NECK:  Supple, no stridor  CARDIO:  regular rate and rhythm, appears well-perfused  PULM:  No respiratory distress, CTAB GI/GU:  non-distended, soft MSK/SPINE:  No gross deformities, some mild swelling noted about the left thumb but otherwise no edema.  Range of motion in both hands normal.  Sensation intact.  Radial pulses 2+ bilaterally.  No ecchymosis, abrasion or obvious deformity in the right arm. SKIN:  no rash, atraumatic   *Additional and/or pertinent findings included in MDM below   ED Results / Procedures / Treatments   Labs (all labs ordered are listed, but only abnormal results are displayed) Labs Reviewed - No data to  display  EKG None  Radiology DG Elbow Complete Right  Result Date: 05/11/2023 CLINICAL DATA:  Patient fell down 3 steps. EXAM: RIGHT ELBOW - COMPLETE 3+ VIEW COMPARISON:  None Available. FINDINGS: There is no evidence of fracture, dislocation, or joint effusion. There is no evidence of arthropathy or other focal bone abnormality. Soft tissues are unremarkable. IMPRESSION: Negative. Electronically Signed   By: Emmaline Kluver M.D.   On: 05/11/2023 19:57   DG Hand Complete Left  Result Date: 05/11/2023 CLINICAL DATA:  Patient fell down 3 steps.  Left thumb pain. EXAM: LEFT HAND - COMPLETE 3+ VIEW COMPARISON:  None Available. FINDINGS: There is no evidence of fracture or dislocation. There is no evidence of arthropathy or other focal bone abnormality. Soft tissues are unremarkable. IMPRESSION: Negative. Electronically Signed   By: Emmaline Kluver M.D.   On: 05/11/2023 19:56   DG Forearm Right  Result Date: 05/11/2023 CLINICAL DATA:  Patient fell down 3 steps. EXAM: RIGHT FOREARM - 2 VIEW COMPARISON:  None Available. FINDINGS: There is no evidence of fracture or other focal bone lesions. Soft tissues are unremarkable. IMPRESSION: Negative. Electronically Signed   By: Emmaline Kluver M.D.   On: 05/11/2023 19:55    Procedures Procedures    Medications Ordered in ED Medications - No data to display  ED Course/ Medical Decision Making/ A&P                             Medical Decision Making Amount and/or Complexity of Data Reviewed Radiology: ordered.   47 year old who is well-appearing on blood thinners for recent PE presenting for mechanical fall yesterday.  Exam is unremarkable did reveal some swelling about the left thumb.  X-rays were negative. Have low suspicion for active bleeding since her fall.  Advise follow-up with PCP.  Discussed pertinent return precautions.  Vitals remained stable discharge.        Final Clinical Impression(s) / ED Diagnoses Final diagnoses:   Fall, initial encounter    Rx / DC Orders ED Discharge Orders     None         Vaughan Browner 05/11/23 2014    Linwood Dibbles, MD 05/12/23 1606

## 2024-03-22 DIAGNOSIS — K529 Noninfective gastroenteritis and colitis, unspecified: Secondary | ICD-10-CM | POA: Diagnosis not present

## 2024-03-22 DIAGNOSIS — R030 Elevated blood-pressure reading, without diagnosis of hypertension: Secondary | ICD-10-CM | POA: Diagnosis not present

## 2024-03-22 DIAGNOSIS — R1084 Generalized abdominal pain: Secondary | ICD-10-CM | POA: Diagnosis not present

## 2024-04-14 DIAGNOSIS — H00012 Hordeolum externum right lower eyelid: Secondary | ICD-10-CM | POA: Diagnosis not present

## 2024-04-27 DIAGNOSIS — M79605 Pain in left leg: Secondary | ICD-10-CM | POA: Diagnosis not present

## 2024-04-27 DIAGNOSIS — E7800 Pure hypercholesterolemia, unspecified: Secondary | ICD-10-CM | POA: Diagnosis not present

## 2024-04-27 DIAGNOSIS — Z0000 Encounter for general adult medical examination without abnormal findings: Secondary | ICD-10-CM | POA: Diagnosis not present

## 2024-04-27 DIAGNOSIS — J4520 Mild intermittent asthma, uncomplicated: Secondary | ICD-10-CM | POA: Diagnosis not present

## 2024-04-27 DIAGNOSIS — J302 Other seasonal allergic rhinitis: Secondary | ICD-10-CM | POA: Diagnosis not present

## 2024-04-27 DIAGNOSIS — I10 Essential (primary) hypertension: Secondary | ICD-10-CM | POA: Diagnosis not present

## 2024-04-27 DIAGNOSIS — E559 Vitamin D deficiency, unspecified: Secondary | ICD-10-CM | POA: Diagnosis not present

## 2024-04-27 DIAGNOSIS — L918 Other hypertrophic disorders of the skin: Secondary | ICD-10-CM | POA: Diagnosis not present

## 2024-10-31 ENCOUNTER — Emergency Department (HOSPITAL_BASED_OUTPATIENT_CLINIC_OR_DEPARTMENT_OTHER)
Admission: EM | Admit: 2024-10-31 | Discharge: 2024-10-31 | Disposition: A | Discharge status: Home / Self Care | Attending: Emergency Medicine | Admitting: Emergency Medicine

## 2024-10-31 ENCOUNTER — Encounter (HOSPITAL_BASED_OUTPATIENT_CLINIC_OR_DEPARTMENT_OTHER): Payer: Self-pay

## 2024-10-31 ENCOUNTER — Other Ambulatory Visit: Payer: Self-pay

## 2024-10-31 ENCOUNTER — Emergency Department (HOSPITAL_BASED_OUTPATIENT_CLINIC_OR_DEPARTMENT_OTHER)

## 2024-10-31 DIAGNOSIS — R112 Nausea with vomiting, unspecified: Secondary | ICD-10-CM | POA: Diagnosis not present

## 2024-10-31 DIAGNOSIS — Z79899 Other long term (current) drug therapy: Secondary | ICD-10-CM | POA: Diagnosis not present

## 2024-10-31 DIAGNOSIS — Z86711 Personal history of pulmonary embolism: Secondary | ICD-10-CM | POA: Diagnosis not present

## 2024-10-31 DIAGNOSIS — I10 Essential (primary) hypertension: Secondary | ICD-10-CM | POA: Diagnosis not present

## 2024-10-31 DIAGNOSIS — R1032 Left lower quadrant pain: Secondary | ICD-10-CM | POA: Insufficient documentation

## 2024-10-31 DIAGNOSIS — Z7901 Long term (current) use of anticoagulants: Secondary | ICD-10-CM | POA: Insufficient documentation

## 2024-10-31 DIAGNOSIS — J45909 Unspecified asthma, uncomplicated: Secondary | ICD-10-CM | POA: Diagnosis not present

## 2024-10-31 LAB — CBC WITH DIFFERENTIAL/PLATELET
Abs Immature Granulocytes: 0.02 K/uL (ref 0.00–0.07)
Basophils Absolute: 0 K/uL (ref 0.0–0.1)
Basophils Relative: 0 %
Eosinophils Absolute: 0 K/uL (ref 0.0–0.5)
Eosinophils Relative: 0 %
HCT: 43.3 % (ref 36.0–46.0)
Hemoglobin: 13.9 g/dL (ref 12.0–15.0)
Immature Granulocytes: 0 %
Lymphocytes Relative: 14 %
Lymphs Abs: 1 K/uL (ref 0.7–4.0)
MCH: 28.7 pg (ref 26.0–34.0)
MCHC: 32.1 g/dL (ref 30.0–36.0)
MCV: 89.3 fL (ref 80.0–100.0)
Monocytes Absolute: 0.3 K/uL (ref 0.1–1.0)
Monocytes Relative: 4 %
Neutro Abs: 6 K/uL (ref 1.7–7.7)
Neutrophils Relative %: 82 %
Platelets: 212 K/uL (ref 150–400)
RBC: 4.85 MIL/uL (ref 3.87–5.11)
RDW: 13.3 % (ref 11.5–15.5)
WBC: 7.3 K/uL (ref 4.0–10.5)
nRBC: 0 % (ref 0.0–0.2)

## 2024-10-31 LAB — COMPREHENSIVE METABOLIC PANEL WITH GFR
ALT: 12 U/L (ref 0–44)
AST: 22 U/L (ref 15–41)
Albumin: 3.7 g/dL (ref 3.5–5.0)
Alkaline Phosphatase: 90 U/L (ref 38–126)
Anion gap: 6 (ref 5–15)
BUN: 9 mg/dL (ref 6–20)
CO2: 27 mmol/L (ref 22–32)
Calcium: 9.7 mg/dL (ref 8.9–10.3)
Chloride: 107 mmol/L (ref 98–111)
Creatinine, Ser: 0.64 mg/dL (ref 0.44–1.00)
GFR, Estimated: >60 mL/min (ref >60)
Glucose, Bld: 86 mg/dL (ref 70–99)
Potassium: 3.9 mmol/L (ref 3.5–5.1)
Sodium: 140 mmol/L (ref 135–145)
Total Bilirubin: 0.5 mg/dL (ref 0.0–1.2)
Total Protein: 7.6 g/dL (ref 6.5–8.1)

## 2024-10-31 LAB — URINALYSIS, ROUTINE W REFLEX MICROSCOPIC
Bilirubin Urine: NEGATIVE
Glucose, UA: NEGATIVE mg/dL
Hgb urine dipstick: NEGATIVE
Ketones, ur: 15 mg/dL — AB
Leukocytes,Ua: NEGATIVE
Nitrite: NEGATIVE
Protein, ur: NEGATIVE mg/dL
Specific Gravity, Urine: >1.046 — ABNORMAL HIGH (ref 1.005–1.030)
pH: 7.5 (ref 5.0–8.0)

## 2024-10-31 LAB — LIPASE, BLOOD: Lipase: 29 U/L (ref 11–51)

## 2024-10-31 MED ORDER — IOHEXOL 300 MG/ML  SOLN
100.0000 mL | Freq: Once | INTRAMUSCULAR | Status: AC | PRN
  Administered 2024-10-31: 100 mL via INTRAVENOUS

## 2024-10-31 MED ORDER — SODIUM CHLORIDE 0.9 % IV BOLUS
1000.0000 mL | Freq: Once | INTRAVENOUS | Status: AC
  Administered 2024-10-31: 1000 mL via INTRAVENOUS

## 2024-10-31 MED ORDER — FAMOTIDINE 20 MG PO TABS
20.0000 mg | ORAL_TABLET | Freq: Once | ORAL | Status: AC
  Administered 2024-10-31: 20 mg via ORAL
  Filled 2024-10-31: qty 1

## 2024-10-31 MED ORDER — KETOROLAC TROMETHAMINE 15 MG/ML IJ SOLN
15.0000 mg | Freq: Once | INTRAMUSCULAR | Status: AC
  Administered 2024-10-31: 15 mg via INTRAVENOUS
  Filled 2024-10-31: qty 1

## 2024-10-31 MED ORDER — ONDANSETRON HCL 4 MG PO TABS
4.0000 mg | ORAL_TABLET | Freq: Four times a day (QID) | ORAL | 0 refills | Status: AC
Start: 2024-10-31 — End: ?

## 2024-10-31 MED ORDER — ONDANSETRON HCL 4 MG/2ML IJ SOLN
4.0000 mg | Freq: Once | INTRAMUSCULAR | Status: AC
  Administered 2024-10-31: 4 mg via INTRAVENOUS
  Filled 2024-10-31: qty 2

## 2024-10-31 NOTE — ED Provider Notes (Signed)
 [place] EMERGENCY DEPARTMENT AT [facility] Provider Note   CSN: [id number] Arrival date & time: 10/31/24  1358     Patient presents with: Abdominal Pain   Kristen Swanson is a 48 y.o. female.   Patient is a 48 year old female with past medical history of ADHD, adjustment disorder, pulmonary emboli, and asthma presenting for complaints of abdominal pain.  Patient admits to left-sided lower and upper abdominal pain that started over the last 5 hours.  She admits to an associated nausea and nonbloody emesis.  She denies any diarrhea.  Denies any black or bloody stools.  Abdominal surgical history includes a vaginal partial hysterectomy only.  No dysuria, increased frequency, urgency.  No sick contacts.  No suspicion food intake.  No foreign travel.  Last bowel movement was this morning described as soft and normal.  Of note, patient recommended to be on Eliquis  for pulmonary emboli as well as hydrochlorothiazide  for hypertension.  She is not taking either of these medications at this time.  Blood pressure 182/97 on arrival.  Denies any vision changes, headaches, or tinnitus.  The history is provided by the patient. No language interpreter was used.  Abdominal Pain Associated symptoms: nausea and vomiting   Associated symptoms: no chest pain, no chills, no constipation, no cough, no diarrhea, no dysuria, no fever, no hematuria, no shortness of breath and no sore throat        Prior to Admission medications   Medication Sig Start Date End Date Taking? Authorizing Provider  ondansetron  (ZOFRAN ) 4 MG tablet Take 1 tablet (4 mg total) by mouth every 6 (six) hours. 10/31/24  Yes Elnor Hila P, DO  acetaminophen  (TYLENOL ) 500 MG tablet Take 2 tablets (1,000 mg total) by mouth every 6 (six) hours as needed for moderate pain. 03/17/22   Nivia Colon, PA-C  albuterol  (VENTOLIN  HFA) 108 (90 Base) MCG/ACT inhaler Inhale 1-2 puffs into the lungs every 6 (six) hours as needed for wheezing or  shortness of breath. 03/31/22   Ruthell Lonni FALCON, PA-C  apixaban  (ELIQUIS ) 5 MG TABS tablet Take 2 tablets (10 mg total) by mouth 2 (two) times daily for 7 days. 12/14/22 12/21/22  Regalado, Belkys A, MD  apixaban  (ELIQUIS ) 5 MG TABS tablet Take 1 tablet (5 mg total) by mouth 2 (two) times daily. 12/21/22   Regalado, Belkys A, MD  ferrous sulfate  325 (65 FE) MG tablet Take 1 tablet (325 mg total) by mouth daily. 10/03/19   Dean Clarity, MD  hydrochlorothiazide  (HYDRODIURIL ) 25 MG tablet Take 1 tablet (25 mg total) by mouth daily. 08/25/17   Feliberto Cambric, MD  HYDROcodone -acetaminophen  (NORCO/VICODIN) 5-325 MG tablet Take 1 tablet by mouth every 6 (six) hours as needed for moderate pain (pain score 4-6). 11/01/24   Zackowski, Scott, MD  methocarbamol  (ROBAXIN ) 500 MG tablet Take 1 tablet (500 mg total) by mouth every 6 (six) hours as needed for muscle spasms. 12/14/22   Regalado, Belkys A, MD  ondansetron  (ZOFRAN  ODT) 4 MG disintegrating tablet Take 1 tablet (4 mg total) by mouth every 8 (eight) hours as needed for nausea or vomiting. 02/26/19   Henderly, Britni A, PA-C  ondansetron  (ZOFRAN -ODT) 4 MG disintegrating tablet Take 1 tablet (4 mg total) by mouth every 8 (eight) hours as needed for nausea or vomiting. 11/01/24   Zackowski, Scott, MD  polyethylene glycol (MIRALAX  / GLYCOLAX ) 17 g packet Take 17 g by mouth 2 (two) times daily. 12/14/22   Regalado, Belkys A, MD  potassium chloride  (KLOR-CON )  10 MEQ tablet Take 1 tablet (10 mEq total) by mouth daily. 10/03/19   Dean Clarity, MD  predniSONE  (DELTASONE ) 20 MG tablet Take 40 mg by mouth for 3 days then 30 mg daily for 2 days then 20 mg daily for 2 days then 10 mg for 3 days. Then stop. 12/15/22   Regalado, Belkys A, MD  Vitamin D, Ergocalciferol, (DRISDOL) 1.25 MG (50000 UNIT) CAPS capsule Take 50,000 Units by mouth every 7 (seven) days. saturdays 01/24/22   [provider]  dicyclomine  (BENTYL ) 20 MG tablet Take 1 tablet (20 mg total) by  mouth 2 (two) times daily. 02/26/19 01/31/21  Henderly, Britni A, PA-C    Allergies: Patient has no known allergies.    Review of Systems  Constitutional:  Negative for chills and fever.  HENT:  Negative for ear pain and sore throat.   Eyes:  Negative for pain and visual disturbance.  Respiratory:  Negative for cough and shortness of breath.   Cardiovascular:  Negative for chest pain and palpitations.  Gastrointestinal:  Positive for abdominal pain, nausea and vomiting. Negative for blood in stool, constipation and diarrhea.  Genitourinary:  Negative for dysuria and hematuria.  Musculoskeletal:  Negative for arthralgias and back pain.  Skin:  Negative for color change and rash.  Neurological:  Negative for seizures and syncope.  All other systems reviewed and are negative.   Updated Vital Signs BP 126/70 (BP Location: Right Arm)   Pulse 79   Temp 98.2 F (36.8 C) (Oral)   Resp 16   Ht 5' 10 (1.778 m)   Wt 108.9 kg   LMP 12/27/2019   SpO2 100%   BMI 34.44 kg/m   Physical Exam Vitals and nursing note reviewed.  Constitutional:      General: She is not in acute distress.    Appearance: She is well-developed.  HENT:     Head: Normocephalic and atraumatic.  Eyes:     Conjunctiva/sclera: Conjunctivae normal.  Cardiovascular:     Rate and Rhythm: Normal rate and regular rhythm.     Heart sounds: No murmur heard. Pulmonary:     Effort: Pulmonary effort is normal. No respiratory distress.     Breath sounds: Normal breath sounds.  Abdominal:     Palpations: Abdomen is soft.     Tenderness: There is generalized abdominal tenderness. There is no guarding or rebound.  Musculoskeletal:        General: No swelling.     Cervical back: Neck supple.  Skin:    General: Skin is warm and dry.     Capillary Refill: Capillary refill takes less than 2 seconds.  Neurological:     Mental Status: She is alert.  Psychiatric:        Mood and Affect: Mood normal.     (all labs ordered  are listed, but only abnormal results are displayed) Labs Reviewed  URINALYSIS, ROUTINE W REFLEX MICROSCOPIC - Abnormal; Notable for the following components:      Result Value   Specific Gravity, Urine >1.046 (*)    Ketones, ur 15 (*)    All other components within normal limits  CBC WITH DIFFERENTIAL/PLATELET  COMPREHENSIVE METABOLIC PANEL WITH GFR  LIPASE, BLOOD    EKG: EKG Interpretation Date/Time:  Sunday October 31 2024 14:20:09 EST Ventricular Rate:  79 PR Interval:  170 QRS Duration:  91 QT Interval:  406 QTC Calculation: 466 R Axis:   30  Text Interpretation: Sinus rhythm Consider anterior infarct Confirmed by  Neysa Clap [phone]) on 11/01/2024 8:31:38 PM  Radiology: No results found.   Procedures   Medications Ordered in the ED  sodium chloride  0.9 % bolus 1,000 mL (0 mLs Intravenous Stopped 10/31/24 1725)  ondansetron  (ZOFRAN ) injection 4 mg (4 mg Intravenous Given 10/31/24 1619)  famotidine (PEPCID) tablet 20 mg (20 mg Oral Given 10/31/24 1620)  ketorolac  (TORADOL ) 15 MG/ML injection 15 mg (15 mg Intravenous Given 10/31/24 1619)  iohexol  (OMNIPAQUE ) 300 MG/ML solution 100 mL (100 mLs Intravenous Contrast Given 10/31/24 1755)                                    Medical Decision Making Amount and/or Complexity of Data Reviewed Labs: ordered. Radiology: ordered.  Risk Prescription drug management.   48 year old female with past medical history of ADHD, adjustment disorder, pulmonary emboli, and asthma presenting for complaints of abdominal pain.  Patient is alert and oriented x 3, no acute distress, afebrile, hypertensive at 182/97 with otherwise stable vital signs.   Abdominal pain, nausea, vomiting: Abdomen is soft with tenderness in the left lower quadrant.  No guarding or distention.  Differential diagnosis includes but is not limited to gastroenteritis, foodborne illness, diverticulitis, colitis.  Lower and differential bowel obstruction due to soft bowel  movement this morning and abdominal surgical history.  Fluids, Pepcid, Zofran , Toradol  started.  Laboratory studies are stable.  No leukocytosis or signs or symptoms of sepsis.  Liver profile, lipase, and renal function within normal limits.  CT abdomen pelvis demonstrates cholelithiasis without cholecystitis.SABRA which shouldn't explain LLQ pain. RUQ nontender on exam. Otherwise no acute findings.  No colitis or diverticulitis.  No ileus or bowel obstruction. On re-evaluation patient's symptoms have greatly improved. Ready for dc home with instructions for BRAT diet and prescription for zofran  sent to pharmacy. Recs to f/u with pcp if symptoms are not completely resolved in 3-5 days.   Hypertension: 182/97.  Patient is noncompliant on her hydrochlorothiazide  stating  God's got me. Somewhat improved after nausea/vomiting resolved. Rec taking home medications as prescribed by pcp.  Patient in no distress and overall condition improved here in the ED. Detailed discussions were had with the patient regarding current findings, and need for close f/u with PCP or on call doctor. The patient has been instructed to return immediately if the symptoms worsen in any way for re-evaluation. Patient verbalized understanding and is in agreement with current care plan. All questions answered prior to discharge.        Final diagnoses:  Left lower quadrant abdominal pain  Nausea and vomiting, unspecified vomiting type    ED Discharge Orders          Ordered    ondansetron  (ZOFRAN ) 4 MG tablet  Every 6 hours        10/31/24 1920               Elnor Bernarda SQUIBB, DO 11/08/24 1551

## 2024-10-31 NOTE — ED Notes (Signed)
 Delay in triage as patient needed to use the restroom

## 2024-10-31 NOTE — Discharge Instructions (Addendum)
 Prescription sent for Zofran  which is an antinausea medication that helped your symptoms today sent to your pharmacy.  Please follow-up with up with your PCP in the next 3 to 5 days if your symptoms do not begin to improve.

## 2024-10-31 NOTE — ED Triage Notes (Signed)
 Patient presents with generalized abd pain +nausea, vomiting. Also expressed concern with blood pressure prescribed medication but does not take as prescribed. Denies headache, chest pain, shortness of breath.

## 2024-10-31 NOTE — ED Notes (Signed)
 Patient transported to CT

## 2024-11-01 ENCOUNTER — Emergency Department (HOSPITAL_BASED_OUTPATIENT_CLINIC_OR_DEPARTMENT_OTHER)
Admission: EM | Admit: 2024-11-01 | Discharge: 2024-11-01 | Disposition: A | Discharge status: Home / Self Care | Attending: Emergency Medicine | Admitting: Emergency Medicine

## 2024-11-01 ENCOUNTER — Other Ambulatory Visit: Payer: Self-pay

## 2024-11-01 ENCOUNTER — Other Ambulatory Visit (HOSPITAL_BASED_OUTPATIENT_CLINIC_OR_DEPARTMENT_OTHER): Payer: Self-pay

## 2024-11-01 ENCOUNTER — Encounter (HOSPITAL_BASED_OUTPATIENT_CLINIC_OR_DEPARTMENT_OTHER): Payer: Self-pay

## 2024-11-01 ENCOUNTER — Emergency Department (HOSPITAL_BASED_OUTPATIENT_CLINIC_OR_DEPARTMENT_OTHER): Admitting: Radiology

## 2024-11-01 ENCOUNTER — Emergency Department (HOSPITAL_BASED_OUTPATIENT_CLINIC_OR_DEPARTMENT_OTHER)

## 2024-11-01 DIAGNOSIS — Z7901 Long term (current) use of anticoagulants: Secondary | ICD-10-CM | POA: Diagnosis not present

## 2024-11-01 DIAGNOSIS — R1030 Lower abdominal pain, unspecified: Secondary | ICD-10-CM | POA: Diagnosis present

## 2024-11-01 DIAGNOSIS — J45909 Unspecified asthma, uncomplicated: Secondary | ICD-10-CM | POA: Insufficient documentation

## 2024-11-01 DIAGNOSIS — I10 Essential (primary) hypertension: Secondary | ICD-10-CM | POA: Insufficient documentation

## 2024-11-01 LAB — CBC
HCT: 38.6 % (ref 36.0–46.0)
Hemoglobin: 12.6 g/dL (ref 12.0–15.0)
MCH: 28.7 pg (ref 26.0–34.0)
MCHC: 32.6 g/dL (ref 30.0–36.0)
MCV: 87.9 fL (ref 80.0–100.0)
Platelets: 263 K/uL (ref 150–400)
RBC: 4.39 MIL/uL (ref 3.87–5.11)
RDW: 13.1 % (ref 11.5–15.5)
WBC: 4.8 K/uL (ref 4.0–10.5)
nRBC: 0 % (ref 0.0–0.2)

## 2024-11-01 LAB — COMPREHENSIVE METABOLIC PANEL WITH GFR
ALT: 11 U/L (ref 0–44)
AST: 24 U/L (ref 15–41)
Albumin: 3.9 g/dL (ref 3.5–5.0)
Alkaline Phosphatase: 95 U/L (ref 38–126)
Anion gap: 9 (ref 5–15)
BUN: 7 mg/dL (ref 6–20)
CO2: 24 mmol/L (ref 22–32)
Calcium: 9.6 mg/dL (ref 8.9–10.3)
Chloride: 106 mmol/L (ref 98–111)
Creatinine, Ser: 0.58 mg/dL (ref 0.44–1.00)
GFR, Estimated: >60 mL/min (ref >60)
Glucose, Bld: 115 mg/dL — ABNORMAL HIGH (ref 70–99)
Potassium: 3.7 mmol/L (ref 3.5–5.1)
Sodium: 139 mmol/L (ref 135–145)
Total Bilirubin: 0.7 mg/dL (ref 0.0–1.2)
Total Protein: 8.2 g/dL — ABNORMAL HIGH (ref 6.5–8.1)

## 2024-11-01 LAB — LIPASE, BLOOD: Lipase: 32 U/L (ref 11–51)

## 2024-11-01 LAB — HCG, SERUM, QUALITATIVE: Preg, Serum: NEGATIVE

## 2024-11-01 MED ORDER — ONDANSETRON HCL 4 MG/2ML IJ SOLN
4.0000 mg | Freq: Once | INTRAMUSCULAR | Status: AC
  Administered 2024-11-01: 4 mg via INTRAVENOUS
  Filled 2024-11-01: qty 2

## 2024-11-01 MED ORDER — ONDANSETRON HCL 4 MG/2ML IJ SOLN
4.0000 mg | Freq: Once | INTRAMUSCULAR | Status: AC | PRN
  Administered 2024-11-01: 4 mg via INTRAVENOUS
  Filled 2024-11-01: qty 2

## 2024-11-01 MED ORDER — HYDROCODONE-ACETAMINOPHEN 5-325 MG PO TABS
1.0000 | ORAL_TABLET | Freq: Four times a day (QID) | ORAL | 0 refills | Status: AC | PRN
  Filled 2024-11-01: qty 12, 3d supply, fill #0

## 2024-11-01 MED ORDER — HYDROMORPHONE HCL 1 MG/ML IJ SOLN
1.0000 mg | Freq: Once | INTRAMUSCULAR | Status: AC
  Administered 2024-11-01: 1 mg via INTRAVENOUS
  Filled 2024-11-01: qty 1

## 2024-11-01 MED ORDER — ONDANSETRON 4 MG PO TBDP
4.0000 mg | ORAL_TABLET | Freq: Three times a day (TID) | ORAL | 0 refills | Status: AC | PRN
  Filled 2024-11-01: qty 12, 4d supply, fill #0

## 2024-11-01 MED ORDER — SODIUM CHLORIDE 0.9 % IV BOLUS
1000.0000 mL | Freq: Once | INTRAVENOUS | Status: AC
  Administered 2024-11-01: 1000 mL via INTRAVENOUS

## 2024-11-01 NOTE — ED Notes (Signed)
 Spoke with lab to add on preg qual

## 2024-11-01 NOTE — ED Triage Notes (Signed)
 Patient has abdominal pain. Reports she was here last night and got CT scan and pain medication. She said once the pain medication wore off she got sick again. She reports did she not fill her Zofran .

## 2024-11-01 NOTE — ED Provider Notes (Addendum)
 [place] EMERGENCY DEPARTMENT AT [facility] Provider Note   CSN: [id number] Arrival date & time: 11/01/24  9276     Patient presents with: Abdominal Pain   Kristen Swanson is a 48 y.o. female.   Patient seen in the emergency department yesterday for left-sided abdominal pain.  Patient now talking more about lower abdominal pain may be to the right side.  Patient was treated with Toradol  with improvement given a prescription for Zofran  but did not get it filled.  Awoke at about 2 in the morning with recurrent pain persistent vomiting no blood in the vomit no diarrhea.  Patient has never had anything like this before it all started yesterday morning.  Past medical history sniffer asthma hypertension attention deficit disorder fibroid tumor history of kidney stones adjustment disorder with mixed anxiety history of pulmonary emboli patient is to be on Eliquis .  Patient without any shortness of breath oxygen  sats are 99%.  Pulse is 86.  Temp 98.2 blood pressure 180/93.  But patient is in pain.       Prior to Admission medications   Medication Sig Start Date End Date Taking? Authorizing Provider  acetaminophen  (TYLENOL ) 500 MG tablet Take 2 tablets (1,000 mg total) by mouth every 6 (six) hours as needed for moderate pain. 03/17/22   Nivia Colon, PA-C  albuterol  (VENTOLIN  HFA) 108 (90 Base) MCG/ACT inhaler Inhale 1-2 puffs into the lungs every 6 (six) hours as needed for wheezing or shortness of breath. 03/31/22   Ruthell Lonni FALCON, PA-C  apixaban  (ELIQUIS ) 5 MG TABS tablet Take 2 tablets (10 mg total) by mouth 2 (two) times daily for 7 days. 12/14/22 12/21/22  Regalado, Belkys A, MD  apixaban  (ELIQUIS ) 5 MG TABS tablet Take 1 tablet (5 mg total) by mouth 2 (two) times daily. 12/21/22   Regalado, Belkys A, MD  ferrous sulfate  325 (65 FE) MG tablet Take 1 tablet (325 mg total) by mouth daily. 10/03/19   Dean Clarity, MD  hydrochlorothiazide  (HYDRODIURIL ) 25 MG tablet Take 1 tablet (25 mg  total) by mouth daily. 08/25/17   Feliberto Cambric, MD  methocarbamol  (ROBAXIN ) 500 MG tablet Take 1 tablet (500 mg total) by mouth every 6 (six) hours as needed for muscle spasms. 12/14/22   Regalado, Belkys A, MD  ondansetron  (ZOFRAN  ODT) 4 MG disintegrating tablet Take 1 tablet (4 mg total) by mouth every 8 (eight) hours as needed for nausea or vomiting. 02/26/19   Henderly, Britni A, PA-C  ondansetron  (ZOFRAN ) 4 MG tablet Take 1 tablet (4 mg total) by mouth every 6 (six) hours. 10/31/24   Gray, Alicia P, DO  polyethylene glycol (MIRALAX  / GLYCOLAX ) 17 g packet Take 17 g by mouth 2 (two) times daily. 12/14/22   Regalado, Belkys A, MD  potassium chloride  (KLOR-CON ) 10 MEQ tablet Take 1 tablet (10 mEq total) by mouth daily. 10/03/19   Dean Clarity, MD  predniSONE  (DELTASONE ) 20 MG tablet Take 40 mg by mouth for 3 days then 30 mg daily for 2 days then 20 mg daily for 2 days then 10 mg for 3 days. Then stop. 12/15/22   Regalado, Belkys A, MD  Vitamin D, Ergocalciferol, (DRISDOL) 1.25 MG (50000 UNIT) CAPS capsule Take 50,000 Units by mouth every 7 (seven) days. saturdays 01/24/22   [provider]  dicyclomine  (BENTYL ) 20 MG tablet Take 1 tablet (20 mg total) by mouth 2 (two) times daily. 02/26/19 01/31/21  Henderly, Britni A, PA-C    Allergies: Patient has no known  allergies.    Review of Systems  Constitutional:  Negative for chills and fever.  HENT:  Negative for ear pain and sore throat.   Eyes:  Negative for pain and visual disturbance.  Respiratory:  Negative for cough and shortness of breath.   Cardiovascular:  Negative for chest pain and palpitations.  Gastrointestinal:  Positive for abdominal pain, nausea and vomiting.  Genitourinary:  Negative for dysuria and hematuria.  Musculoskeletal:  Negative for arthralgias and back pain.  Skin:  Negative for color change and rash.  Neurological:  Negative for seizures and syncope.  All other systems reviewed and are negative.   Updated Vital  Signs BP (!) 180/93 (BP Location: Right Arm)   Pulse 86   Temp 98.2 F (36.8 C) (Oral)   Resp 20   LMP 12/27/2019   SpO2 99%   Physical Exam Vitals and nursing note reviewed.  Constitutional:      General: She is not in acute distress.    Appearance: Normal appearance. She is well-developed.  HENT:     Head: Normocephalic and atraumatic.     Mouth/Throat:     Mouth: Mucous membranes are moist.  Eyes:     Extraocular Movements: Extraocular movements intact.     Conjunctiva/sclera: Conjunctivae normal.     Pupils: Pupils are equal, round, and reactive to light.  Cardiovascular:     Rate and Rhythm: Normal rate and regular rhythm.     Heart sounds: No murmur heard. Pulmonary:     Effort: Pulmonary effort is normal. No respiratory distress.     Breath sounds: Normal breath sounds.  Abdominal:     General: There is no distension.     Palpations: Abdomen is soft.     Tenderness: There is no abdominal tenderness. There is no guarding.  Musculoskeletal:        General: No swelling.     Cervical back: Neck supple.  Skin:    General: Skin is warm and dry.     Capillary Refill: Capillary refill takes less than 2 seconds.  Neurological:     General: No focal deficit present.     Mental Status: She is alert and oriented to person, place, and time.  Psychiatric:        Mood and Affect: Mood normal.     (all labs ordered are listed, but only abnormal results are displayed) Labs Reviewed  COMPREHENSIVE METABOLIC PANEL WITH GFR - Abnormal; Notable for the following components:      Result Value   Glucose, Bld 115 (*)    Total Protein 8.2 (*)    All other components within normal limits  LIPASE, BLOOD  CBC  URINALYSIS, ROUTINE W REFLEX MICROSCOPIC    EKG: None  Radiology: CT ABDOMEN PELVIS W CONTRAST Result Date: 10/31/2024 CLINICAL DATA:  Left lower quadrant abdominal pain EXAM: CT ABDOMEN AND PELVIS WITH CONTRAST TECHNIQUE: Multidetector CT imaging of the abdomen and  pelvis was performed using the standard protocol following bolus administration of intravenous contrast. RADIATION DOSE REDUCTION: This exam was performed according to the departmental dose-optimization program which includes automated exposure control, adjustment of the mA and/or kV according to patient size and/or use of iterative reconstruction technique. CONTRAST:  OMNIPAQUE  IOHEXOL  300 MG/ML  SOLN COMPARISON:  02/26/2019 FINDINGS: Lower chest: No acute pleural or parenchymal lung disease. Hepatobiliary: Small calcified gallstones without evidence of acute cholecystitis. The liver is unremarkable. No biliary duct dilation. Pancreas: Unremarkable. No pancreatic ductal dilatation or surrounding inflammatory changes. Spleen:  Normal in size without focal abnormality. Adrenals/Urinary Tract: Adrenal glands are unremarkable. Kidneys are normal, without renal calculi, focal lesion, or hydronephrosis. Bladder is decompressed, limiting its evaluation. Stomach/Bowel: No bowel obstruction or ileus. Normal appendix right lower quadrant. No bowel wall thickening or inflammatory change. Vascular/Lymphatic: No significant vascular findings are present. No enlarged abdominal or pelvic lymph nodes. Reproductive: Status post hysterectomy. No adnexal masses. Other: No free fluid or free intraperitoneal gas. No abdominal wall hernia. Musculoskeletal: No acute or destructive bony abnormalities. Reconstructed images demonstrate no additional findings. IMPRESSION: 1. Cholelithiasis without evidence of acute cholecystitis. 2. Otherwise unremarkable CT of the abdomen and pelvis. Electronically Signed   By: Ozell Daring M.D.   On: 10/31/2024 18:29     Procedures   Medications Ordered in the ED  ondansetron  (ZOFRAN ) injection 4 mg (has no administration in time range)  HYDROmorphone  (DILAUDID ) injection 1 mg (has no administration in time range)  sodium chloride  0.9 % bolus 1,000 mL (has no administration in time range)   ondansetron  (ZOFRAN ) injection 4 mg (4 mg Intravenous Given 11/01/24 0802)                                    Medical Decision Making Amount and/or Complexity of Data Reviewed Labs: ordered. Radiology: ordered.  Risk Prescription drug management.   Patient seen yesterday.  CT scan negative except for did show cholelithiasis.  Patient is abdomen soft and nontender on exam currently.  Lipase today 32 complete metabolic panel normal including liver function test.  CBC white count 4.8 hemoglobin 12.6 platelets 263.  Will go ahead and get acute abdominal series.  Will give some IV fluids and antinausea medicine and pain medicine.  May also get ultrasound of gallbladder.  But will check acute abdominal series first.  Pregnancy test negative. Acute abdominal series nonobstructive gas bowel gas pattern.  No acute cardiopulmonary abnormality.  Will going get ultrasound right upper quadrant because of the history of the gallstones.  Ultrasound does show gallstones.  But patient is nontender in right upper quadrant and there was no Murphy sign.  There is no evidence of any gallbladder inflammation.  I do not think that patient's lower abdominal pain is related to that.  But close follow-up with her primary care doctor would be important.  Will treat symptomatically will send prescription meds here because patient did not pick up the ones from yesterday.   Final diagnoses:  Lower abdominal pain    ED Discharge Orders     None          Geraldene Hamilton, MD 11/01/24 9163    Geraldene Hamilton, MD 11/01/24 9060    Geraldene Hamilton, MD 11/01/24 1226

## 2024-11-01 NOTE — Discharge Instructions (Signed)
 Take the Zofran  ODT as needed for nausea vomiting.  Take the hydrocodone  as needed for pain.  Make an appointment follow-up with your doctor.  Today's workup without any acute findings.  You do have gallstones.  But we feel that your symptoms are probably not related to that.  But you could get symptoms related to that in the future.  Return for any fever chills any right upper quadrant or epigastric abdominal pain.
